# Patient Record
Sex: Female | Born: 1935 | Race: White | Hispanic: No | Marital: Married | Smoking: Former smoker
Health system: Southern US, Community
[De-identification: ages and names within clinical notes are randomized; demographics above are authoritative.]

## PROBLEM LIST (undated history)

## (undated) DIAGNOSIS — M810 Age-related osteoporosis without current pathological fracture: Secondary | ICD-10-CM

## (undated) DIAGNOSIS — K279 Peptic ulcer, site unspecified, unspecified as acute or chronic, without hemorrhage or perforation: Secondary | ICD-10-CM

## (undated) DIAGNOSIS — R269 Unspecified abnormalities of gait and mobility: Secondary | ICD-10-CM

## (undated) DIAGNOSIS — M5481 Occipital neuralgia: Secondary | ICD-10-CM

## (undated) DIAGNOSIS — M1990 Unspecified osteoarthritis, unspecified site: Secondary | ICD-10-CM

## (undated) DIAGNOSIS — S42001A Fracture of unspecified part of right clavicle, initial encounter for closed fracture: Secondary | ICD-10-CM

## (undated) DIAGNOSIS — K219 Gastro-esophageal reflux disease without esophagitis: Secondary | ICD-10-CM

## (undated) DIAGNOSIS — F0781 Postconcussional syndrome: Secondary | ICD-10-CM

## (undated) DIAGNOSIS — G5600 Carpal tunnel syndrome, unspecified upper limb: Secondary | ICD-10-CM

## (undated) DIAGNOSIS — R413 Other amnesia: Secondary | ICD-10-CM

## (undated) DIAGNOSIS — G500 Trigeminal neuralgia: Secondary | ICD-10-CM

## (undated) DIAGNOSIS — E039 Hypothyroidism, unspecified: Secondary | ICD-10-CM

## (undated) DIAGNOSIS — S060X0A Concussion without loss of consciousness, initial encounter: Secondary | ICD-10-CM

## (undated) DIAGNOSIS — F0390 Unspecified dementia without behavioral disturbance: Secondary | ICD-10-CM

## (undated) DIAGNOSIS — I82409 Acute embolism and thrombosis of unspecified deep veins of unspecified lower extremity: Secondary | ICD-10-CM

## (undated) DIAGNOSIS — K649 Unspecified hemorrhoids: Secondary | ICD-10-CM

## (undated) DIAGNOSIS — I872 Venous insufficiency (chronic) (peripheral): Secondary | ICD-10-CM

## (undated) DIAGNOSIS — S329XXA Fracture of unspecified parts of lumbosacral spine and pelvis, initial encounter for closed fracture: Secondary | ICD-10-CM

## (undated) HISTORY — PX: VAGINAL HYSTERECTOMY: SUR661

## (undated) HISTORY — DX: Unspecified hemorrhoids: K64.9

## (undated) HISTORY — DX: Postconcussional syndrome: F07.81

## (undated) HISTORY — DX: Peptic ulcer, site unspecified, unspecified as acute or chronic, without hemorrhage or perforation: K27.9

## (undated) HISTORY — DX: Fracture of unspecified parts of lumbosacral spine and pelvis, initial encounter for closed fracture: S32.9XXA

## (undated) HISTORY — DX: Gastro-esophageal reflux disease without esophagitis: K21.9

## (undated) HISTORY — DX: Occipital neuralgia: M54.81

## (undated) HISTORY — DX: Venous insufficiency (chronic) (peripheral): I87.2

## (undated) HISTORY — DX: Unspecified dementia without behavioral disturbance: F03.90

## (undated) HISTORY — DX: Acute embolism and thrombosis of unspecified deep veins of unspecified lower extremity: I82.409

## (undated) HISTORY — DX: Unspecified osteoarthritis, unspecified site: M19.90

## (undated) HISTORY — DX: Carpal tunnel syndrome, unspecified upper limb: G56.00

## (undated) HISTORY — PX: TONSILLECTOMY: SUR1361

## (undated) HISTORY — PX: OTHER SURGICAL HISTORY: SHX169

## (undated) HISTORY — DX: Other amnesia: R41.3

## (undated) HISTORY — DX: Age-related osteoporosis without current pathological fracture: M81.0

## (undated) HISTORY — DX: Trigeminal neuralgia: G50.0

## (undated) HISTORY — PX: NASAL SINUS SURGERY: SHX719

## (undated) HISTORY — DX: Concussion without loss of consciousness, initial encounter: S06.0X0A

## (undated) HISTORY — DX: Fracture of unspecified part of right clavicle, initial encounter for closed fracture: S42.001A

## (undated) HISTORY — DX: Hypothyroidism, unspecified: E03.9

---
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
## 2004-07-18 ENCOUNTER — Ambulatory Visit: Payer: Self-pay | Admitting: Unknown Physician Specialty

## 2005-05-03 ENCOUNTER — Ambulatory Visit: Payer: Self-pay | Admitting: Internal Medicine

## 2005-08-29 ENCOUNTER — Ambulatory Visit: Payer: Self-pay

## 2005-09-22 ENCOUNTER — Ambulatory Visit: Payer: Self-pay | Admitting: General Practice

## 2005-09-22 ENCOUNTER — Other Ambulatory Visit: Payer: Self-pay

## 2005-10-19 ENCOUNTER — Ambulatory Visit: Payer: Self-pay | Admitting: General Practice

## 2006-05-07 ENCOUNTER — Ambulatory Visit: Payer: Self-pay | Admitting: Internal Medicine

## 2007-01-16 ENCOUNTER — Encounter: Payer: Self-pay | Admitting: Rheumatology

## 2007-02-07 ENCOUNTER — Encounter: Payer: Self-pay | Admitting: Rheumatology

## 2007-05-13 ENCOUNTER — Ambulatory Visit: Payer: Self-pay | Admitting: Internal Medicine

## 2008-05-15 ENCOUNTER — Ambulatory Visit: Payer: Self-pay | Admitting: Internal Medicine

## 2009-02-23 ENCOUNTER — Ambulatory Visit: Payer: Self-pay | Admitting: Orthopedic Surgery

## 2009-02-23 ENCOUNTER — Ambulatory Visit: Payer: Self-pay | Admitting: Cardiology

## 2009-03-03 ENCOUNTER — Ambulatory Visit: Payer: Self-pay | Admitting: Orthopedic Surgery

## 2009-05-18 ENCOUNTER — Ambulatory Visit: Payer: Self-pay | Admitting: Internal Medicine

## 2009-12-17 ENCOUNTER — Ambulatory Visit: Payer: Self-pay | Admitting: Unknown Physician Specialty

## 2010-04-26 ENCOUNTER — Inpatient Hospital Stay: Payer: Self-pay | Admitting: Internal Medicine

## 2010-04-28 ENCOUNTER — Encounter: Payer: Self-pay | Admitting: Internal Medicine

## 2010-05-09 ENCOUNTER — Encounter: Payer: Self-pay | Admitting: Internal Medicine

## 2010-08-11 ENCOUNTER — Ambulatory Visit: Payer: Self-pay | Admitting: Internal Medicine

## 2011-08-14 ENCOUNTER — Ambulatory Visit: Payer: Self-pay | Admitting: Internal Medicine

## 2012-08-19 ENCOUNTER — Ambulatory Visit: Payer: Self-pay | Admitting: Internal Medicine

## 2012-09-03 ENCOUNTER — Inpatient Hospital Stay: Payer: Self-pay | Admitting: Orthopedic Surgery

## 2012-09-03 LAB — CBC
MCH: 31.9 pg (ref 26.0–34.0)
MCV: 95 fL (ref 80–100)
Platelet: 205 10*3/uL (ref 150–440)
RBC: 3.04 10*6/uL — ABNORMAL LOW (ref 3.80–5.20)
RDW: 13.1 % (ref 11.5–14.5)
WBC: 4.9 10*3/uL (ref 3.6–11.0)

## 2012-09-03 LAB — COMPREHENSIVE METABOLIC PANEL
Albumin: 3.5 g/dL (ref 3.4–5.0)
Alkaline Phosphatase: 65 U/L (ref 50–136)
Bilirubin,Total: 0.2 mg/dL (ref 0.2–1.0)
Calcium, Total: 7.8 mg/dL — ABNORMAL LOW (ref 8.5–10.1)
Co2: 23 mmol/L (ref 21–32)
Creatinine: 0.9 mg/dL (ref 0.60–1.30)
EGFR (African American): >60
EGFR (Non-African Amer.): >60
Glucose: 97 mg/dL (ref 65–99)
SGPT (ALT): 21 U/L (ref 12–78)

## 2012-09-03 LAB — URINALYSIS, COMPLETE
Bilirubin,UR: NEGATIVE
Blood: NEGATIVE
Glucose,UR: NEGATIVE mg/dL (ref 0–75)
Leukocyte Esterase: NEGATIVE
RBC,UR: 2 /HPF (ref 0–5)
Squamous Epithelial: <1
WBC UR: <1 /HPF (ref 0–5)

## 2012-09-03 LAB — PROTIME-INR: Prothrombin Time: 13 secs (ref 11.5–14.7)

## 2012-09-04 LAB — CBC WITH DIFFERENTIAL/PLATELET
Basophil #: 0 10*3/uL (ref 0.0–0.1)
Basophil %: 0.6 %
Basophil %: 0.5 %
Eosinophil #: 0 10*3/uL (ref 0.0–0.7)
Eosinophil #: 0.1 10*3/uL (ref 0.0–0.7)
Eosinophil %: 0.8 %
HCT: 26.2 % — ABNORMAL LOW (ref 35.0–47.0)
HCT: 26.3 % — ABNORMAL LOW (ref 35.0–47.0)
HGB: 8.8 g/dL — ABNORMAL LOW (ref 12.0–16.0)
Lymphocyte #: 1.2 10*3/uL (ref 1.0–3.6)
Lymphocyte %: 18.8 %
Lymphocyte %: 14.3 %
MCH: 31.8 pg (ref 26.0–34.0)
MCHC: 34 g/dL (ref 32.0–36.0)
MCHC: 33.5 g/dL (ref 32.0–36.0)
MCV: 94 fL (ref 80–100)
MCV: 95 fL (ref 80–100)
Monocyte #: 0.9 x10 3/mm (ref 0.2–0.9)
Monocyte %: 9.7 %
Neutrophil #: 5.2 10*3/uL (ref 1.4–6.5)
Neutrophil #: 6 10*3/uL (ref 1.4–6.5)
Neutrophil %: 70.4 %
Neutrophil %: 73.8 %
Platelet: 185 10*3/uL (ref 150–440)
RBC: 2.8 10*6/uL — ABNORMAL LOW (ref 3.80–5.20)
RBC: 2.77 10*6/uL — ABNORMAL LOW (ref 3.80–5.20)
RDW: 13.1 % (ref 11.5–14.5)
RDW: 13.3 % (ref 11.5–14.5)

## 2012-09-04 LAB — BASIC METABOLIC PANEL
Calcium, Total: 7.7 mg/dL — ABNORMAL LOW (ref 8.5–10.1)
Co2: 27 mmol/L (ref 21–32)
EGFR (African American): >60
EGFR (Non-African Amer.): >60
Glucose: 91 mg/dL (ref 65–99)
Osmolality: 276 (ref 275–301)
Potassium: 4 mmol/L (ref 3.5–5.1)

## 2012-09-05 LAB — CBC WITH DIFFERENTIAL/PLATELET
Basophil %: 0.5 %
HCT: 25.6 % — ABNORMAL LOW (ref 35.0–47.0)
HGB: 8.8 g/dL — ABNORMAL LOW (ref 12.0–16.0)
Lymphocyte #: 0.8 10*3/uL — ABNORMAL LOW (ref 1.0–3.6)
Lymphocyte %: 10.5 %
MCHC: 34.2 g/dL (ref 32.0–36.0)
MCV: 93 fL (ref 80–100)
Monocyte %: 11.4 %
Neutrophil #: 6 10*3/uL (ref 1.4–6.5)
Neutrophil %: 77.4 %
WBC: 7.7 10*3/uL (ref 3.6–11.0)

## 2012-09-05 LAB — BASIC METABOLIC PANEL
Anion Gap: 6 — ABNORMAL LOW (ref 7–16)
BUN: 10 mg/dL (ref 7–18)
Calcium, Total: 7.4 mg/dL — ABNORMAL LOW (ref 8.5–10.1)
Chloride: 103 mmol/L (ref 98–107)
Co2: 27 mmol/L (ref 21–32)
Creatinine: 0.45 mg/dL — ABNORMAL LOW (ref 0.60–1.30)
EGFR (African American): >60
Sodium: 136 mmol/L (ref 136–145)

## 2012-09-06 LAB — CBC WITH DIFFERENTIAL/PLATELET
Basophil #: 0 10*3/uL (ref 0.0–0.1)
Basophil %: 0.4 %
Eosinophil #: 0.1 10*3/uL (ref 0.0–0.7)
HCT: 27.2 % — ABNORMAL LOW (ref 35.0–47.0)
HGB: 9.5 g/dL — ABNORMAL LOW (ref 12.0–16.0)
Lymphocyte %: 22.2 %
MCH: 32.6 pg (ref 26.0–34.0)
MCHC: 34.8 g/dL (ref 32.0–36.0)
MCV: 94 fL (ref 80–100)
Monocyte #: 1.6 x10 3/mm — ABNORMAL HIGH (ref 0.2–0.9)
Monocyte %: 17.3 %
Neutrophil #: 5.3 10*3/uL (ref 1.4–6.5)
Platelet: 180 10*3/uL (ref 150–440)
RDW: 13.1 % (ref 11.5–14.5)
WBC: 9.1 10*3/uL (ref 3.6–11.0)

## 2012-09-07 LAB — CBC WITH DIFFERENTIAL/PLATELET
Basophil %: 0.6 %
Eosinophil %: 2.3 %
HCT: 26.3 % — ABNORMAL LOW (ref 35.0–47.0)
HGB: 8.9 g/dL — ABNORMAL LOW (ref 12.0–16.0)
Lymphocyte %: 21.7 %
MCH: 31.7 pg (ref 26.0–34.0)
MCV: 94 fL (ref 80–100)
Monocyte #: 1.3 x10 3/mm — ABNORMAL HIGH (ref 0.2–0.9)
Neutrophil #: 5.1 10*3/uL (ref 1.4–6.5)
Platelet: 192 10*3/uL (ref 150–440)
RBC: 2.8 10*6/uL — ABNORMAL LOW (ref 3.80–5.20)
RDW: 13.3 % (ref 11.5–14.5)

## 2012-09-09 LAB — CREATININE, SERUM
EGFR (African American): >60
EGFR (Non-African Amer.): >60

## 2013-03-27 ENCOUNTER — Other Ambulatory Visit: Payer: Self-pay

## 2013-03-27 MED ORDER — GABAPENTIN 100 MG PO CAPS: 100.0000 mg | ORAL_CAPSULE | Freq: Two times a day (BID) | ORAL | Status: DC

## 2013-03-27 MED ORDER — CARBAMAZEPINE ER 200 MG PO TB12: 200.0000 mg | ORAL_TABLET | Freq: Two times a day (BID) | ORAL | Status: DC

## 2013-06-24 ENCOUNTER — Encounter: Payer: Self-pay | Admitting: Neurology

## 2013-06-26 ENCOUNTER — Ambulatory Visit: Payer: Self-pay | Admitting: Neurology

## 2013-07-11 ENCOUNTER — Ambulatory Visit (INDEPENDENT_AMBULATORY_CARE_PROVIDER_SITE_OTHER): Payer: Medicare Other | Admitting: Neurology

## 2013-07-11 ENCOUNTER — Encounter: Payer: Self-pay | Admitting: Neurology

## 2013-07-11 VITALS — BP 132/71 | HR 74 | Wt 130.0 lb

## 2013-07-11 DIAGNOSIS — Z5181 Encounter for therapeutic drug level monitoring: Secondary | ICD-10-CM

## 2013-07-11 DIAGNOSIS — G500 Trigeminal neuralgia: Secondary | ICD-10-CM

## 2013-07-11 MED ORDER — MELOXICAM 7.5 MG PO TABS: 7.5000 mg | ORAL_TABLET | Freq: Every day | ORAL | Status: DC

## 2013-07-11 NOTE — Progress Notes (Signed)
Reason for visit: Trigeminal neuralgia  Suzanne Russo is an 79 y.o. female  History of present illness:  Suzanne Russo is a 79 year old right-handed white female with a history of a right trigeminal neuralgia and a left-sided occipital neuralgia. The patient has been on carbamazepine and low-dose gabapentin. The patient has had good pain control with these medications. The patient reports no significant issues since last seen in March of 2014. The patient has fallen in June of 2014, and fractured her right clavicle. The patient was out in the yard, and she did not have her cane with her. The patient has recovered from this fall. The patient indicates that she is having a lot of pain in her right thumb associated with arthritis. The patient has had prior surgery on her left thumb. The patient otherwise is doing well, she returns for an evaluation.  Past Medical History  Diagnosis Date  . Trigeminal neuralgia   . Occipital neuralgia   . Carpal tunnel syndrome   . Venous insufficiency   . DVT (deep venous thrombosis)   . Hypothyroidism   . Osteoporosis   . Hemorrhoids   . Degenerative arthritis   . GERD (gastroesophageal reflux disease)   . Peptic ulcer disease   . Pelvic fracture   . Right clavicle fracture     Past Surgical History  Procedure Laterality Date  . Left thumb surgery    . Left toe surgery    . Bilateral knee surgery    . Bilateral cataract surgery    . Nasal sinus surgery    . Tonsillectomy    . Vaginal hysterectomy    . Left hip fracture      Family History  Problem Relation Age of Onset  . Heart disease Mother   . COPD Father   . Congestive Heart Failure Brother     Social history:  reports that she has quit smoking. She has never used smokeless tobacco. She reports that  drinks alcohol. She reports that she does not use illicit drugs.    Allergies  Allergen Reactions  . Tetracyclines & Related Rash    Medications:  Current Outpatient Prescriptions on  File Prior to Visit  Medication Sig Dispense Refill  . carbamazepine (TEGRETOL XR) 200 MG 12 hr tablet Take 1 tablet (200 mg total) by mouth 2 (two) times daily.  180 tablet  1  . gabapentin (NEURONTIN) 100 MG capsule Take 1 capsule (100 mg total) by mouth 2 (two) times daily.  180 capsule  1  . levothyroxine (SYNTHROID, LEVOTHROID) 100 MCG tablet Take 100 mcg by mouth daily before breakfast.      . mometasone (NASONEX) 50 MCG/ACT nasal spray Place 2 sprays into the nose daily.      . vitamin E 400 UNIT capsule Take 400 Units by mouth daily.      . zoledronic acid (RECLAST) 5 MG/100ML SOLN injection Inject 5 mg into the vein once.       No current facility-administered medications on file prior to visit.    ROS:  Out of a complete 14 system review of symptoms, the patient complains only of the following symptoms, and all other reviewed systems are negative.  Swelling in the legs Diarrhea Joint pain, joint swelling, achy muscles Allergies  Blood pressure 132/71, pulse 74, weight 130 lb (58.968 kg).  Physical Exam  General: The patient is alert and cooperative at the time of the examination.  Skin: 1+ edema below the knees is noted bilaterally.  Neurologic Exam  Cranial nerves: Facial symmetry is present. Speech is normal, no aphasia or dysarthria is noted. Extraocular movements are full. Visual fields are full.  Motor: The patient has good strength in all 4 extremities.  Coordination: The patient has good finger-nose-finger and heel-to-shin bilaterally.  Gait and station: The patient has a slightly unsteady gait, using a cane for ambulation. Tandem gait was not attempted. Romberg is negative. No drift is seen.  Reflexes: Deep tendon reflexes are symmetric.   Assessment/Plan:  1. Right trigeminal neuralgia  2. Left occipital neuralgia  3. Gait disorder  The patient is doing fairly well on the carbamazepine. The patient will undergo blood work today for this. The  patient will followup through this office in one year or as needed.  Marlan Palau MD 07/12/2013 8:06 AM  [facility] [address] [place], [place] [address]  Phone [phone] Fax [phone]

## 2013-07-12 ENCOUNTER — Telehealth: Payer: Self-pay | Admitting: Neurology

## 2013-07-12 LAB — CBC WITH DIFFERENTIAL
Basophils Absolute: 0 10*3/uL (ref 0.0–0.2)
HCT: 32.2 % — ABNORMAL LOW (ref 34.0–46.6)
Immature Granulocytes: 0 %
Lymphocytes Absolute: 1.4 10*3/uL (ref 0.7–3.1)
MCH: 30.8 pg (ref 26.6–33.0)
MCV: 93 fL (ref 79–97)
Monocytes: 16 %
Neutrophils Absolute: 2.3 10*3/uL (ref 1.4–7.0)
Neutrophils Relative %: 49 %
Platelets: 270 10*3/uL (ref 150–379)
RDW: 13.6 % (ref 12.3–15.4)
WBC: 4.7 10*3/uL (ref 3.4–10.8)

## 2013-07-12 LAB — COMPREHENSIVE METABOLIC PANEL
Albumin: 4.3 g/dL (ref 3.5–4.8)
Alkaline Phosphatase: 86 IU/L (ref 39–117)
BUN/Creatinine Ratio: 22 (ref 11–26)
BUN: 15 mg/dL (ref 8–27)
Creatinine, Ser: 0.69 mg/dL (ref 0.57–1.00)
GFR calc Af Amer: 96 mL/min/{1.73_m2} (ref >59)
Globulin, Total: 2.4 g/dL (ref 1.5–4.5)
Glucose: 75 mg/dL (ref 65–99)
Total Bilirubin: 0.3 mg/dL (ref 0.0–1.2)
Total Protein: 6.7 g/dL (ref 6.0–8.5)

## 2013-07-12 NOTE — Telephone Encounter (Signed)
I called the patient. The blood work show a therapeutic carbamazepine level, and a mild anemia, slightly low chloride level. I will get these results to the primary care MD.

## 2013-09-29 ENCOUNTER — Other Ambulatory Visit: Payer: Self-pay

## 2013-09-29 MED ORDER — CARBAMAZEPINE ER 200 MG PO TB12: 200.0000 mg | ORAL_TABLET | Freq: Two times a day (BID) | ORAL | Status: DC

## 2013-09-29 MED ORDER — GABAPENTIN 100 MG PO CAPS: 100.0000 mg | ORAL_CAPSULE | Freq: Two times a day (BID) | ORAL | Status: DC

## 2013-10-30 ENCOUNTER — Ambulatory Visit: Payer: Self-pay | Admitting: Internal Medicine

## 2013-12-25 ENCOUNTER — Observation Stay: Payer: Self-pay | Admitting: Internal Medicine

## 2013-12-26 LAB — CBC WITH DIFFERENTIAL/PLATELET
BASOS PCT: 1.2 %
Basophil #: 0.1 10*3/uL (ref 0.0–0.1)
EOS ABS: 0.1 10*3/uL (ref 0.0–0.7)
Eosinophil %: 1.6 %
HCT: 28.5 % — ABNORMAL LOW (ref 35.0–47.0)
HGB: 9.8 g/dL — ABNORMAL LOW (ref 12.0–16.0)
LYMPHS ABS: 1.4 10*3/uL (ref 1.0–3.6)
LYMPHS PCT: 28.3 %
MCH: 32 pg (ref 26.0–34.0)
MCHC: 34.2 g/dL (ref 32.0–36.0)
MCV: 93 fL (ref 80–100)
MONO ABS: 0.7 x10 3/mm (ref 0.2–0.9)
MONOS PCT: 14.2 %
NEUTROS ABS: 2.7 10*3/uL (ref 1.4–6.5)
NEUTROS PCT: 54.7 %
PLATELETS: 207 10*3/uL (ref 150–440)
RBC: 3.06 10*6/uL — ABNORMAL LOW (ref 3.80–5.20)
RDW: 13.9 % (ref 11.5–14.5)
WBC: 5 10*3/uL (ref 3.6–11.0)

## 2013-12-26 LAB — BASIC METABOLIC PANEL
Anion Gap: 4 — ABNORMAL LOW (ref 7–16)
BUN: 12 mg/dL (ref 7–18)
CREATININE: 0.5 mg/dL — AB (ref 0.60–1.30)
Calcium, Total: 8 mg/dL — ABNORMAL LOW (ref 8.5–10.1)
Chloride: 104 mmol/L (ref 98–107)
Co2: 29 mmol/L (ref 21–32)
GLUCOSE: 82 mg/dL (ref 65–99)
OSMOLALITY: 273 (ref 275–301)
Potassium: 3.5 mmol/L (ref 3.5–5.1)
Sodium: 137 mmol/L (ref 136–145)

## 2013-12-27 LAB — HEMOGLOBIN: HGB: 10.4 g/dL — AB (ref 12.0–16.0)

## 2013-12-27 LAB — URINALYSIS, COMPLETE
BLOOD: NEGATIVE
Bacteria: NONE SEEN
Bilirubin,UR: NEGATIVE
GLUCOSE, UR: NEGATIVE mg/dL (ref 0–75)
Ketone: NEGATIVE
Leukocyte Esterase: NEGATIVE
Nitrite: NEGATIVE
Ph: 7 (ref 4.5–8.0)
Protein: NEGATIVE
RBC,UR: 2 /HPF (ref 0–5)
SPECIFIC GRAVITY: 1.013 (ref 1.003–1.030)
Squamous Epithelial: <1
WBC UR: 1 /HPF (ref 0–5)

## 2013-12-30 ENCOUNTER — Encounter: Payer: Self-pay | Admitting: Internal Medicine

## 2013-12-30 LAB — CREATININE, SERUM
CREATININE: 0.68 mg/dL (ref 0.60–1.30)
EGFR (African American): >60
EGFR (Non-African Amer.): >60

## 2014-01-07 ENCOUNTER — Encounter: Payer: Self-pay | Admitting: Internal Medicine

## 2014-02-18 ENCOUNTER — Other Ambulatory Visit: Payer: Self-pay

## 2014-02-18 MED ORDER — CARBAMAZEPINE ER 200 MG PO TB12: 200.0000 mg | ORAL_TABLET | Freq: Two times a day (BID) | ORAL | Status: DC

## 2014-02-19 ENCOUNTER — Telehealth: Payer: Self-pay | Admitting: Neurology

## 2014-02-19 NOTE — Telephone Encounter (Signed)
We have only prescribed one strength of this drug.  I called back spoke with Lyla Sonarrie.  She was not able to assist me.  [facility] She said they only have one Rx on file, and does not understand why they called us.  She then [facility] who said they have no record of anyone at their facility contacting us regarding this patient.  They said they called the patient to schedule shipment and the patient told them she did not need refills at this time, so they will save the Rx on the patient's file, and when she needs it, they will fill it.  Nothing is needed from us at this time.

## 2014-02-19 NOTE — Telephone Encounter (Signed)
[facility] calling to state that they received 2 different strengths for the same prescription Tegretol XR and they wanted to clear that up with us. Please call and advise.

## 2014-03-06 ENCOUNTER — Telehealth: Payer: Self-pay | Admitting: Neurology

## 2014-03-06 MED ORDER — CARBAMAZEPINE ER 400 MG PO TB12: 400.0000 mg | ORAL_TABLET | Freq: Two times a day (BID) | ORAL | Status: DC

## 2014-03-06 NOTE — Telephone Encounter (Signed)
Patient needs Rx called in for Tegretol XR 400mg  to [facility]--patient says we may have received request already from [facility]--patient does not need Tegretol XR 200mg  at this time--thank you.

## 2014-03-06 NOTE — Telephone Encounter (Signed)
Rx has been sent  

## 2014-03-18 ENCOUNTER — Encounter: Payer: Self-pay | Admitting: Podiatry

## 2014-03-18 ENCOUNTER — Ambulatory Visit (INDEPENDENT_AMBULATORY_CARE_PROVIDER_SITE_OTHER): Payer: Medicare Other

## 2014-03-18 ENCOUNTER — Ambulatory Visit (INDEPENDENT_AMBULATORY_CARE_PROVIDER_SITE_OTHER): Payer: Medicare Other | Admitting: Podiatry

## 2014-03-18 VITALS — BP 127/73 | HR 63 | Resp 16

## 2014-03-18 DIAGNOSIS — M779 Enthesopathy, unspecified: Secondary | ICD-10-CM

## 2014-03-18 DIAGNOSIS — L03039 Cellulitis of unspecified toe: Secondary | ICD-10-CM

## 2014-03-18 NOTE — Progress Notes (Signed)
   Subjective:    Patient ID: Suzanne Russo, female    DOB: 26-Dec-1931, 80 y.o.   MRN: [id number]  HPI Comments: Its my 2nd toe on my left foot. i dropped a empty wine bottle on it Monday pm. It is painful. It hurts to walk. i wear open toe shoes and i put a bandage on it at night.  Foot Pain Associated symptoms include abdominal pain, nausea, vomiting and weakness.      Review of Systems  Constitutional: Positive for unexpected weight change.  HENT: Positive for hearing loss.        Ringing in ears   Respiratory: Positive for shortness of breath.   Cardiovascular: Positive for leg swelling.  Gastrointestinal: Positive for nausea, vomiting, abdominal pain, diarrhea and constipation.       Bloating  Musculoskeletal: Positive for back pain.       Joint pain Difficulty walking  Neurological: Positive for weakness.  All other systems reviewed and are negative.      Objective:   Physical Exam: I have reviewed her past medical history medications allergies surgeries social history and review systems. Pulses are strongly palpable. Neurologic sensorium is intact per since once the monofilament. Deep tendon reflexes are intact bilateral. Muscle strength is 5 over 5 dorsiflexors plantar flexors inverters everters all intrinsic musculature is intact. Orthopedic evaluation demonstrates all joints distal to the ankle a full range of motion without crepitus she has had surgery to her second third metatarsals as well as her second third digits of the left foot the resulted in an elevated second toe. Cutaneous evaluation demonstrates subungual hematoma second digit of the left foot. Radiographic evaluation demonstrates screw to the second toe does not appear to be a communication between the skin and screw. No fracture noted.        Assessment & Plan:  Assessment: Subungual hematoma second toe left foot.  Plan: Total nail avulsion today and removal of all necrotic tissue at this point the screw  head was not exposed. She tolerated procedure well with local anesthetic she will start soaking twice daily beginning tomorrow.

## 2014-03-18 NOTE — Patient Instructions (Signed)

## 2014-03-25 ENCOUNTER — Ambulatory Visit (INDEPENDENT_AMBULATORY_CARE_PROVIDER_SITE_OTHER): Payer: Medicare Other | Admitting: Podiatry

## 2014-03-25 VITALS — BP 113/43 | HR 59 | Resp 16

## 2014-03-25 DIAGNOSIS — L03039 Cellulitis of unspecified toe: Secondary | ICD-10-CM

## 2014-03-25 NOTE — Progress Notes (Signed)
She presents today after one week of soaking her second toe in Betadine and water and applying a Band-Aid. She status post nail avulsion with I&D second digit left foot.  Objective: Vital signs are stable she is alert and oriented x3. Nailbed appears to be healing quite nicely I see no signs of infection there is no erythema edema cellulitis drainage or odor to the second digital nail bed left foot.  Assessment: Well-healing nail avulsion left.  Plan: Discontinue Betadine start with Epsom salts warm water soaks covered in the day and leave open at night continue to soak and to completely resolved notify me with any questions or concerns otherwise will followup with her in 3-4 weeks

## 2014-04-15 ENCOUNTER — Ambulatory Visit: Payer: Medicare Other | Admitting: Podiatry

## 2014-05-20 ENCOUNTER — Other Ambulatory Visit: Payer: Self-pay

## 2014-05-20 MED ORDER — CARBAMAZEPINE ER 200 MG PO TB12: 200.0000 mg | ORAL_TABLET | Freq: Two times a day (BID) | ORAL | Status: DC

## 2014-06-25 ENCOUNTER — Ambulatory Visit: Payer: Self-pay | Admitting: Unknown Physician Specialty

## 2014-06-26 LAB — PATHOLOGY REPORT

## 2014-07-08 ENCOUNTER — Other Ambulatory Visit: Payer: Self-pay

## 2014-07-08 MED ORDER — GABAPENTIN 100 MG PO CAPS: 100.0000 mg | ORAL_CAPSULE | Freq: Two times a day (BID) | ORAL | Status: DC

## 2014-07-09 ENCOUNTER — Telehealth: Payer: Self-pay | Admitting: Neurology

## 2014-07-09 ENCOUNTER — Ambulatory Visit: Payer: Self-pay | Admitting: Specialist

## 2014-07-09 NOTE — Telephone Encounter (Signed)
See phone note

## 2014-07-09 NOTE — Telephone Encounter (Signed)
Patient's daughter Malen GauzeMarleen called and wanting to make Dr. Anne HahnWillis aware before seeing patient on 10/8 concerning issues noticed with patient recently.  Patient has had increased signs of dementia, vivid dreams that she acts on, gets up in middle to night to set table for company coming for dinner.  Space's out doesn't know husband, thinks spouse is father or son.  Has had a lot physical condition changes in last 6 months.  Questioning if Dementia screening will be done at next appointment.  FYI

## 2014-07-09 NOTE — Telephone Encounter (Signed)
Events noted, the patient will be seen on 07/16/2014. We will need to address the memory issues at that time.

## 2014-07-16 ENCOUNTER — Ambulatory Visit (INDEPENDENT_AMBULATORY_CARE_PROVIDER_SITE_OTHER): Payer: Medicare Other | Admitting: Neurology

## 2014-07-16 ENCOUNTER — Encounter: Payer: Self-pay | Admitting: Neurology

## 2014-07-16 VITALS — BP 153/78 | HR 63 | Ht 63.0 in | Wt 118.4 lb

## 2014-07-16 DIAGNOSIS — R413 Other amnesia: Secondary | ICD-10-CM

## 2014-07-16 DIAGNOSIS — E538 Deficiency of other specified B group vitamins: Secondary | ICD-10-CM

## 2014-07-16 DIAGNOSIS — G500 Trigeminal neuralgia: Secondary | ICD-10-CM

## 2014-07-16 HISTORY — DX: Other amnesia: R41.3

## 2014-07-16 MED ORDER — CARBAMAZEPINE ER 200 MG PO TB12: 200.0000 mg | ORAL_TABLET | Freq: Two times a day (BID) | ORAL | Status: DC

## 2014-07-16 MED ORDER — GABAPENTIN 100 MG PO CAPS: 100.0000 mg | ORAL_CAPSULE | Freq: Two times a day (BID) | ORAL | Status: DC

## 2014-07-16 MED ORDER — CARBAMAZEPINE ER 400 MG PO TB12: 400.0000 mg | ORAL_TABLET | Freq: Two times a day (BID) | ORAL | Status: DC

## 2014-07-16 NOTE — Patient Instructions (Signed)
Trigeminal Neuralgia  Trigeminal neuralgia is a nerve disorder that causes sudden attacks of severe facial pain. It is caused by damage to the trigeminal nerve, a major nerve in the face. It is more common in women and in the elderly, although it can also happen in younger patients. Attacks last from a few seconds to several minutes and can occur from a couple of times per year to several times per day. Trigeminal neuralgia can be a very distressing and disabling condition. Surgery may be needed in very severe cases if medical treatment does not give relief.  HOME CARE INSTRUCTIONS    If your caregiver prescribed medication to help prevent attacks, take as directed.   To help prevent attacks:   Chew on the unaffected side of the mouth.   Avoid touching your face.   Avoid blasts of hot or cold air.   Men may wish to grow a beard to avoid having to shave.  SEEK IMMEDIATE MEDICAL CARE IF:   Pain is unbearable and your medicine does not help.   You develop new, unexplained symptoms (problems).   You have problems that may be related to a medication you are taking.  Document Released: 09/22/2000 Document Revised: 12/18/2011 Document Reviewed: 07/23/2009  Exit[employer] Patient Information 2015 [employer]. This information is not intended to replace advice given to you by your health care provider. Make sure you discuss any questions you have with your health care provider.

## 2014-07-16 NOTE — Progress Notes (Signed)
Reason for visit: Trigeminal neuralgia  Suzanne Russo is an 80 y.o. female  History of present illness:  Suzanne Russo is an 80 year old right-handed white female with a history of trigeminal neuralgia on the right and occipital neuralgia on the left. The patient has done fairly well with the use of gabapentin and carbamazepine. The patient has begun having episodes of vivid dreams within the last month. The patient will wake up at night, and she will be confused, and she will continue to act out her dreams. The patient has some recollection of events around this time. During the daytime, she has not had any problems. The patient denies any new medications that were added or subtracted. The patient continues to do well with her neuralgia pain. She indicates that these episodes of vivid dreams may occur 2 or 3 times a week. The patient has recently been evaluated for gastritis. 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 She has had some recent weight loss. She comes to this office for an evaluation. The patient denies any significant issues with memory otherwise.  Past Medical History  Diagnosis Date  . Trigeminal neuralgia   . Occipital neuralgia   . Carpal tunnel syndrome   . Venous insufficiency   . DVT (deep venous thrombosis)   . Hypothyroidism   . Osteoporosis   . Hemorrhoids   . Degenerative arthritis   . GERD (gastroesophageal reflux disease)   . Peptic ulcer disease   . Pelvic fracture   . Right clavicle fracture   . Memory difficulty 07/16/2014    Past Surgical History  Procedure Laterality Date  . Left thumb surgery    . Left toe surgery    . Bilateral knee surgery    . Bilateral cataract surgery    . Nasal sinus surgery    . Tonsillectomy    . Vaginal hysterectomy    . Left hip fracture      Family History  Problem Relation Age of Onset  . Heart disease Mother   . COPD Father   . Congestive Heart Failure Brother     Social history:   reports that she has quit smoking. Her smoking use included Cigarettes. She has a 45 pack-year smoking history. She has never used smokeless tobacco. She reports that she drinks alcohol. She reports that she does not use illicit drugs.    Allergies  Allergen Reactions  . Sulfa Antibiotics Rash  . Tetracycline Rash    Medications:  Current Outpatient Prescriptions on File Prior to Visit  Medication Sig Dispense Refill  . Calcium Carbonate-Vitamin D (CALCIUM + D PO) Take by mouth daily.      . celecoxib (CELEBREX) 100 MG capsule Take 100 mg by mouth daily.      . Cyanocobalamin (B-12 PO) Take by mouth daily.      Marland Kitchen levothyroxine (SYNTHROID, LEVOTHROID) 100 MCG tablet Take 100 mcg by mouth daily before breakfast.      . loratadine (CLARITIN) 10 MG tablet Take 10 mg by mouth daily.      . mometasone (NASONEX) 50 MCG/ACT nasal spray Place 2 sprays into the nose daily.      Marland Kitchen omeprazole (PRILOSEC) 20 MG capsule Take 20 mg by mouth daily.      . vitamin E 400 UNIT capsule Take 400 Units by mouth daily.      . zoledronic acid (RECLAST) 5 MG/100ML SOLN injection Inject 5 mg into the vein once.  No current facility-administered medications on file prior to visit.    ROS:  Out of a complete 14 system review of symptoms, the patient complains only of the following symptoms, and all other reviewed systems are negative.  Cold intolerance Abdominal pain Constipation, diarrhea, nausea, vomiting Joint pain Memory loss, headache  Blood pressure 153/78, pulse 63, height 5\' 3"  (1.6 m), weight 118 lb 6.4 oz (53.706 kg).  Physical Exam  General: The patient is alert and cooperative at the time of the examination.  Skin: 1+ edema of ankles is noted bilaterally.   Neurologic Exam  Mental status: The  Mini-Mental status examination done today shows a total score of 28/30.  Cranial nerves: Facial symmetry is present. Speech is normal, no aphasia or dysarthria is noted. Extraocular  movements are full. Visual fields are full.  Motor: The patient has good strength in all 4 extremities.  Sensory examination: Soft touch sensation is symmetric on the face, arms, and legs.  Coordination: The patient has good finger-nose-finger and heel-to-shin bilaterally.  Gait and station: The patient has a normal gait. Tandem gait is unsteady. Romberg is negative. No drift is seen.  Reflexes: Deep tendon reflexes are symmetric.   Assessment/Plan:  1. Right sided trigeminal neuralgia  2. Left occipital neuralgia  3. Vivid dreams, possible memory disorder  The patient will be followed for her memory. Blood work will be done today. The patient will followup in 4 months. If there does appear to be progression of memory, MRI evaluation the brain will be done.  Marlan Palau. Keith Waynette Towers MD 07/16/2014 7:32 PM  [facility] [address] [place], [place] [id number]  Phone [phone] Fax [phone]

## 2014-07-17 ENCOUNTER — Telehealth: Payer: Self-pay | Admitting: Neurology

## 2014-07-17 LAB — COMPREHENSIVE METABOLIC PANEL
A/G RATIO: 2.3 (ref 1.1–2.5)
ALBUMIN: 4.4 g/dL (ref 3.5–4.7)
ALK PHOS: 55 IU/L (ref 39–117)
ALT: 21 IU/L (ref 0–32)
AST: 20 IU/L (ref 0–40)
BUN / CREAT RATIO: 15 (ref 11–26)
BUN: 10 mg/dL (ref 8–27)
CO2: 27 mmol/L (ref 18–29)
CREATININE: 0.66 mg/dL (ref 0.57–1.00)
Calcium: 8.9 mg/dL (ref 8.7–10.3)
Chloride: 92 mmol/L — ABNORMAL LOW (ref 97–108)
GFR, EST AFRICAN AMERICAN: 96 mL/min/{1.73_m2} (ref >59)
GFR, EST NON AFRICAN AMERICAN: 84 mL/min/{1.73_m2} (ref >59)
GLOBULIN, TOTAL: 1.9 g/dL (ref 1.5–4.5)
Glucose: 86 mg/dL (ref 65–99)
Potassium: 4.1 mmol/L (ref 3.5–5.2)
Sodium: 135 mmol/L (ref 134–144)
Total Bilirubin: 0.3 mg/dL (ref 0.0–1.2)
Total Protein: 6.3 g/dL (ref 6.0–8.5)

## 2014-07-17 LAB — CBC WITH DIFFERENTIAL
Basophils Absolute: 0 10*3/uL (ref 0.0–0.2)
Basos: 1 %
EOS: 2 %
Eosinophils Absolute: 0.1 10*3/uL (ref 0.0–0.4)
HCT: 29.9 % — ABNORMAL LOW (ref 34.0–46.6)
Hemoglobin: 10.4 g/dL — ABNORMAL LOW (ref 11.1–15.9)
Immature Grans (Abs): 0 10*3/uL (ref 0.0–0.1)
Immature Granulocytes: 0 %
LYMPHS ABS: 1.7 10*3/uL (ref 0.7–3.1)
Lymphs: 46 %
MCH: 31.6 pg (ref 26.6–33.0)
MCHC: 34.8 g/dL (ref 31.5–35.7)
MCV: 91 fL (ref 79–97)
MONOS ABS: 0.6 10*3/uL (ref 0.1–0.9)
Monocytes: 14 %
Neutrophils Absolute: 1.4 10*3/uL (ref 1.4–7.0)
Neutrophils Relative %: 37 %
PLATELETS: 277 10*3/uL (ref 150–379)
RBC: 3.29 x10E6/uL — AB (ref 3.77–5.28)
RDW: 13.7 % (ref 12.3–15.4)
WBC: 3.9 10*3/uL (ref 3.4–10.8)

## 2014-07-17 LAB — VITAMIN B12: Vitamin B-12: 1104 pg/mL — ABNORMAL HIGH (ref 211–946)

## 2014-07-17 LAB — RPR: SYPHILIS RPR SCR: NONREACTIVE

## 2014-07-17 LAB — CARBAMAZEPINE LEVEL, TOTAL: Carbamazepine Lvl: 10.2 ug/mL (ref 4.0–12.0)

## 2014-07-17 NOTE — Telephone Encounter (Signed)
I called the patient. The blood work it shows a relatively normal comprehensive metabolic profile exception of a slightly low but stable chloride level. The CBC shows a stable mild anemia when compared blood work done one year ago. Carbamazepine level is therapeutic, vitamin B12 level is okay.

## 2014-07-20 ENCOUNTER — Ambulatory Visit: Payer: Self-pay | Admitting: Unknown Physician Specialty

## 2014-08-26 ENCOUNTER — Encounter: Payer: Self-pay | Admitting: Neurology

## 2014-09-01 ENCOUNTER — Encounter: Payer: Self-pay | Admitting: Neurology

## 2014-11-19 ENCOUNTER — Ambulatory Visit: Payer: Medicare Other | Admitting: Neurology

## 2014-11-19 ENCOUNTER — Telehealth: Payer: Self-pay | Admitting: Neurology

## 2014-11-19 MED ORDER — CARBAMAZEPINE ER 300 MG PO CP12: 600.0000 mg | ORAL_CAPSULE | Freq: Two times a day (BID) | ORAL | Status: DC

## 2014-11-19 NOTE — Telephone Encounter (Signed)
I called patient. The patient is on carbamazepine for neuralgia pain, okay to switch to generic.

## 2014-11-19 NOTE — Telephone Encounter (Signed)
Patient stated Rx's carbamazepine (TEGRETOL XR) 200 MG 12 hr tablet and  carbamazepine (TEGRETOL XR) 400 MG 12 hr tablet are no longer covered on [employer] formulary.   Stated only 100 mg of TEGRETOL brand name on formulary, along with generic 200 mg and 400 mg.  Questioning if Dr. Anne HahnWillis would like her to change to generic brand.  Please call and advise.

## 2014-12-15 ENCOUNTER — Encounter: Payer: Self-pay | Admitting: Neurology

## 2014-12-15 ENCOUNTER — Ambulatory Visit (INDEPENDENT_AMBULATORY_CARE_PROVIDER_SITE_OTHER): Payer: Medicare Other | Admitting: Neurology

## 2014-12-15 VITALS — BP 101/54 | HR 72 | Ht 64.0 in | Wt 119.0 lb

## 2014-12-15 DIAGNOSIS — G500 Trigeminal neuralgia: Secondary | ICD-10-CM

## 2014-12-15 DIAGNOSIS — M5481 Occipital neuralgia: Secondary | ICD-10-CM

## 2014-12-15 DIAGNOSIS — R413 Other amnesia: Secondary | ICD-10-CM | POA: Diagnosis not present

## 2014-12-15 HISTORY — DX: Occipital neuralgia: M54.81

## 2014-12-15 MED ORDER — CARBAMAZEPINE ER 400 MG PO TB12: 400.0000 mg | ORAL_TABLET | Freq: Two times a day (BID) | ORAL | Status: DC

## 2014-12-15 MED ORDER — CARBAMAZEPINE ER 200 MG PO CP12: 200.0000 mg | ORAL_CAPSULE | Freq: Two times a day (BID) | ORAL | Status: DC

## 2014-12-15 NOTE — Patient Instructions (Signed)
Trigeminal Neuralgia  Trigeminal neuralgia is a nerve disorder that causes sudden attacks of severe facial pain. It is caused by damage to the trigeminal nerve, a major nerve in the face. It is more common in women and in the elderly, although it can also happen in younger patients. Attacks last from a few seconds to several minutes and can occur from a couple of times per year to several times per day. Trigeminal neuralgia can be a very distressing and disabling condition. Surgery may be needed in very severe cases if medical treatment does not give relief.  HOME CARE INSTRUCTIONS    If your caregiver prescribed medication to help prevent attacks, take as directed.   To help prevent attacks:   Chew on the unaffected side of the mouth.   Avoid touching your face.   Avoid blasts of hot or cold air.   Men may wish to grow a beard to avoid having to shave.  SEEK IMMEDIATE MEDICAL CARE IF:   Pain is unbearable and your medicine does not help.   You develop new, unexplained symptoms (problems).   You have problems that may be related to a medication you are taking.  Document Released: 09/22/2000 Document Revised: 12/18/2011 Document Reviewed: 07/23/2009  Exit[employer] Patient Information 2015 [employer]. This information is not intended to replace advice given to you by your health care provider. Make sure you discuss any questions you have with your health care provider.

## 2014-12-15 NOTE — Progress Notes (Signed)
Reason for visit: Trigeminal neuralgia  Suzanne Russo is an 81 y.o. female  History of present illness:  Suzanne Russo is an 81 year old right-handed white female with a history of trigeminal neuralgia on the right, occipital neuralgia on the left, and a mild memory disturbance. The patient indicates that she has done quite well with her memory since last seen without much change. The patient has not had any significant issues with the trigeminal neuralgia or with the occipital neuralgia on carbamazepine taking 600 mg twice daily. The patient also is on low-dose gabapentin taking 100 mg the morning and 200 mg in the evening. She is taking this for pain in the right shin area. The patient does find that the medication may make her somewhat drowsy, she will fall sleep if she is inactive. She uses a cane for ambulation, she has not had any recent falls. She has not given up any activities of daily living because of her memory issues at this point. She returns for an evaluation. She does operate a motor vehicle on occasion without difficulty.  Past Medical History  Diagnosis Date  . Trigeminal neuralgia   . Occipital neuralgia   . Carpal tunnel syndrome   . Venous insufficiency   . DVT (deep venous thrombosis)   . Hypothyroidism   . Osteoporosis   . Hemorrhoids   . Degenerative arthritis   . GERD (gastroesophageal reflux disease)   . Peptic ulcer disease   . Pelvic fracture   . Right clavicle fracture   . Memory difficulty 07/16/2014  . Occipital neuralgia of left side 12/15/2014    Past Surgical History  Procedure Laterality Date  . Left thumb surgery    . Left toe surgery    . Bilateral knee surgery    . Bilateral cataract surgery    . Nasal sinus surgery    . Tonsillectomy    . Vaginal hysterectomy    . Left hip fracture      Family History  Problem Relation Age of Onset  . Heart disease Mother   . COPD Father   . Congestive Heart Failure Brother     Social history:   reports that she has quit smoking. Her smoking use included Cigarettes. She has a 45 pack-year smoking history. She has never used smokeless tobacco. She reports that she drinks alcohol. She reports that she does not use illicit drugs.    Allergies  Allergen Reactions  . Sulfa Antibiotics Rash  . Tetracycline Rash    Medications:  Prior to Admission medications   Medication Sig Start Date End Date Taking? Authorizing Provider  Biotin 1000 MCG tablet Take 1,000 mg by mouth daily.   Yes Historical Provider, MD  Calcium Carbonate-Vitamin D (CALCIUM + D PO) Take by mouth daily.   Yes Historical Provider, MD  celecoxib (CELEBREX) 100 MG capsule Take 100 mg by mouth daily.   Yes Historical Provider, MD  Cyanocobalamin (B-12 PO) Take by mouth daily.   Yes Historical Provider, MD  gabapentin (NEURONTIN) 100 MG capsule Take 1 capsule (100 mg total) by mouth 2 (two) times daily. 07/16/14  Yes York Spanielharles K Joene Gelder, MD  hydrocortisone (ANUSOL-HC) 25 MG suppository Place 25 mg rectally as needed.   Yes Historical Provider, MD  levothyroxine (SYNTHROID, LEVOTHROID) 100 MCG tablet Take 100 mcg by mouth daily before breakfast.   Yes Historical Provider, MD  loratadine (CLARITIN) 10 MG tablet Take 10 mg by mouth daily.   Yes Historical Provider, MD  Magnesium-Potassium  250-100 MG TABS Take 1 tablet by mouth daily.   Yes Historical Provider, MD  meclizine (ANTIVERT) 25 MG tablet Take 25 mg by mouth every 6 (six) hours as needed.   Yes Historical Provider, MD  mometasone (NASONEX) 50 MCG/ACT nasal spray Place 2 sprays into the nose daily.   Yes Historical Provider, MD  Omega-3 Fatty Acids (FISH OIL) 1000 MG CAPS Take 1,000 mg by mouth 2 (two) times daily.   Yes Historical Provider, MD  omeprazole (PRILOSEC) 20 MG capsule Take 20 mg by mouth daily.   Yes Historical Provider, MD  sucralfate (CARAFATE) 1 GM/10ML suspension Take 1 g by mouth 4 (four) times daily as needed. 06/17/14  Yes Historical Provider, MD    TEGRETOL-XR 200 MG 12 hr tablet Take 200 mg by mouth daily. 10/10/14  Yes Historical Provider, MD  TEGRETOL-XR 400 MG 12 hr tablet Take 400 mg by mouth daily. 10/10/14  Yes Historical Provider, MD  vitamin E 400 UNIT capsule Take 400 Units by mouth daily.   Yes Historical Provider, MD  zoledronic acid (RECLAST) 5 MG/100ML SOLN injection Inject 5 mg into the vein once.   Yes Historical Provider, MD  hyoscyamine (LEVSIN, ANASPAZ) 0.125 MG tablet Take 0.125 mg by mouth as needed. 07/16/14 07/26/14  Historical Provider, MD    ROS:  Out of a complete 14 system review of symptoms, the patient complains only of the following symptoms, and all other reviewed systems are negative.  Hearing loss, ringing in the ears, runny nose Abdominal pain, constipation, diarrhea, nausea Leg swelling Dizziness, headache, weakness Itching  Blood pressure 101/54, pulse 72, height  (1.626 m), weight 119 lb (53.978 kg).  Physical Exam  General: The patient is alert and cooperative at the time of the examination.  Skin: No significant peripheral edema is noted.   Neurologic Exam  Mental status: The patient is oriented x 3.  Cranial nerves: Facial symmetry is present. Speech is normal, no aphasia or dysarthria is noted. Extraocular movements are full. Visual fields are full.  Motor: The patient has good strength in all 4 extremities.  Sensory examination: Soft touch sensation on the face, arms, and legs is symmetric.  Coordination: The patient has good finger-nose-finger and heel-to-shin bilaterally.  Gait and station: The patient has a slightly wide-based gait. There is a leg length discrepancy, the left leg is shorter. Tandem gait was not attempted. Romberg is negative. No drift is seen.  Reflexes: Deep tendon reflexes are symmetric.   Assessment/Plan:  1. Mild memory disturbance  2. Right trigeminal neuralgia  3. Left occipital neuralgia  The patient is relatively stable with her neuralgia  pain. The memory issues will need to followed over time. The patient has not been able to afford her brand-name Tegretol medication. The patient will be switched to generic carbamazepine extended-release tablet taking 600 mg twice daily. She will follow-up in about 6 months. We may check blood levels of carbamazepine at that time, if the patient is symptomatically following the medication switch, blood work will need to be done earlier. She will continue her gabapentin in low dose.  Marlan Palau MD 12/15/2014 7:51 PM  [facility] [address] [place], [place] [address]  Phone [phone] Fax [phone]

## 2014-12-16 ENCOUNTER — Telehealth: Payer: Self-pay | Admitting: Neurology

## 2014-12-16 MED ORDER — CARBAMAZEPINE ER 300 MG PO CP12: 600.0000 mg | ORAL_CAPSULE | Freq: Two times a day (BID) | ORAL | Status: DC

## 2014-12-16 NOTE — Telephone Encounter (Signed)
Pharmacy indicated the patient would like to be changed to Carbatrol 300mg  taking two twice daily instead of getting Tegretol and Carbatrol.  Okay to change?  Please advise.  Thank you.

## 2014-12-16 NOTE — Telephone Encounter (Signed)
I have no problem with switching the patient to Carbatrol taking 600 mg twice daily. I will call in this prescription.

## 2014-12-16 NOTE — Telephone Encounter (Signed)
Robbin with [facility] called back and stated patient would prefer Carbatrol 300 mg instead of Equetro 300 mg.  Please call and advise.

## 2014-12-16 NOTE — Telephone Encounter (Signed)
Suzanne Russo with [facility] is calling because he received 2 prescriptions for Rx Tegretol XR: one for 400 mg and the other 200 mg. Patient would prefer to have one Rx  Equetro 300mg . Please advise pharmacist. Thanks!

## 2015-01-26 NOTE — Op Note (Signed)
PATIENT NAME:  Suzanne Russo, Suzanne Russo MR#:  [id number] DATE OF BIRTH:  Mar 22, 1932  DATE OF PROCEDURE:  09/04/2012  PREOPERATIVE DIAGNOSIS: Left intertrochanteric hip fracture.   POSTOPERATIVE DIAGNOSIS: Left intertrochanteric hip fracture.   PROCEDURE: Open reduction and internal fixation of left intertrochanteric hip fracture.   ANESTHESIA: Spinal.   COMPLICATIONS: None.   ESTIMATED BLOOD LOSS: 50 mL.   SURGEON: Kathreen DevoidKevin L. Aradhana Gin, MD  IMPLANTS:  Synthes 2 holed DHS plate.  INDICATIONS FOR PROCEDURE: Patient is a 81 year old active female who sustained a mechanical fall at home. She was diagnosed with an intertrochanteric hip fracture on the left side by x-ray in the Emergency Department. I have discussed the diagnosis and have recommended open reduction internal fixation for this patient. I reviewed the risks and benefits of surgery with the patient, her husband and her daughter who are with her preoperatively. They understand the risks include infection, bleeding requiring blood transfusion, nerve or blood vessel injury, change in leg length and change in lower extremity rotation as well as painful hardware, persistent left hip pain, malunion, nonunion, and the need for further surgery including hardware removal and/or total hip arthroplasty. Medical complications include deep vein thrombosis and pulmonary embolism, myocardial infarction, stroke, pneumonia, respiratory failure and death.   PROCEDURE NOTE: The patient's family signed her consent form. Her left hip was marked according to the hospital's right site protocol. She was brought to the Operating Room where she underwent a spinal anesthesia and was positioned supine on the fracture table. Her left leg was placed in leg holder with traction and internal rotation and the right leg was placed in a hemi- lithotomy position. All bony prominences were adequately padded.   A timeout was performed to verify the patient's name, date of birth, medical  record number, correct site of surgery and correct procedure to be performed. It was also used to verify patient had received antibiotics and that all appropriate instruments, implants, and radiographic studies were available in the room. Once all in attendance were in agreement the case began.   C-arm images were taken in the AP and lateral plane to insure adequate closed reduction. Once the fracture was adequately reduced in both AP and lateral projections the patient was prepped and draped in sterile fashion. A lateral incision was made over the lateral hip at the level of the fracture site extending distally. The soft tissues were carefully dissected using electrocautery until the fascia lata was identified. This was then sharply, incised with a deep #10 blade. The vastus lateralis muscle was then elevated from the intermuscular septum until the lateral femur was identified. A 130 degree drill guide was then placed along the lateral femur. A drill pin was advanced into the lateral femur across the fracture site into the femoral head. The position of the guidepin within the femur was confirmed on AP and lateral projections. A tip apex distance of less than 25 mm was achieved. The depth of the guidepin was measured at 95 mm. A long barreled drill was then used to over drill the guidewire to a depth of 95 mm. A Synthes DHS 95 mm lag screw with two hole sideplate was then constructed on the back table. The lag screw was advanced by hand into position in the subchondral bone of the femoral head. The two hole plate was then carefully malleted into position along the collateral femur. The insertion device was then removed. C-arm images in the AP and lateral plane were used to confirm the final  position of the hardware. Once adequate position was achieved two bicortical screws were drilled through the holes in the sideplate. Once in position, final images were taken. The fracture was well reduced and no compression  was necessary. The wound was copiously irrigated. 0 Vicryl was used to close the fascia lata and the wound was then copiously irrigated again. 2-0 Vicryl was then used to approximate the dermal layers and the skin was closed with staples. A dry sterile dressing was applied. The patient was then transferred to a hospital bed and brought to the PAC-U in stable condition. I spoke with the patient's family in the postoperative waiting room to let them know the case had gone without complication and that Suzanne Russo was stable in the recovery room.   ____________________________ [name], MD klk:cms D: 09/11/2012 17:06:06 ET Russo: 09/11/2012 17:43:39 ET JOB#: [id number]  cc: [name], MD, <Dictator> [name] MD ELECTRONICALLY SIGNED 09/12/2012 15:34

## 2015-01-26 NOTE — H&P (Signed)
Subjective/Chief Complaint Left hip pain    History of Present Illness 81 y/o female admitted to orthopaedic surgery overnight after a fall at home.  Patient states she was doing laundry in the basement and fell while leaning up against a bar while collecting things she needed for Thanksgiving.  Patient landed directly on the left hip.  She was unable to stand or ambulate after the fall.  Patient also had pain in the right shoulder.   Past Med/Surgical Hx:  morton's neuroma:   venous clodure right leg:   right knee meniscus repair:   broken pelvis:   SMR:   Hysterectomy - Total:   Tonsillectomy:   ALLERGIES:  Tetracaine: Rash  Sulfa drugs: GI Distress, Other  Tetracycline: Other  HOME MEDICATIONS: Medication Instructions Status  Synthroid 100 mcg (0.1 mg) oral tablet 1 tab(s) orally once a day Active  meloxicam 7.5 mg oral tablet 1 tab(s) orally once a day Active  omeprazole 20 mg oral delayed release capsule 1 cap(s) orally once a day Active  Tegretol XR 400 mg oral tablet, extended release 1 tab(s) orally 2 times a day Active  Tegretol XR 200 mg oral tablet, extended release 1 tab(s) orally 2 times a day Active  Nasonex 50 mcg/inh nasal spray 2 spray(s) nasal once a day Active  cyclobenzaprine 10 mg oral tablet 1 tab(s) orally 3 times a day Active  gabapentin 100 mg oral capsule 1 cap(s) orally 2 times a day Active  Reclast 5 mg/100 mL intravenous solution 1 inch(es) intravenous yearly Active  meclizine 25 mg oral tablet 1 tab(s) orally 3 times a day, As Needed Active  vitamin E  orally once a day Active  Calcium 600+D 600 mg-200 intl units oral tablet 1 tab(s) orally 2 times a day Active  magnesium and potassium aspartate 1   once a day Active  Loratadine-D 24 Hour oral tablet, extended release 1 tab(s) orally once a day Active  Fish Oil 1200 mg oral capsule 1 cap(s) orally 3 times a day Active  Vitamin B-12 1000 mcg oral tablet 1 tab(s) orally once a day Active   Family  and Social History:   Family History Non-Contributory    Place of Living Home   Review of Systems:   Subjective/Chief Complaint Left hip pain and right shoulder pain    Fever/Chills No    Cough No    SOB/DOE No    Chest Pain No    Medications/Allergies Reviewed Medications/Allergies reviewed   Physical Exam:   GEN no acute distress    HEENT PERRL, hearing intact to voice, moist oral mucosa, Oropharynx clear    NECK supple  No masses  trachea midline    RESP normal resp effort  clear BS  no use of accessory muscles    CARD regular rate  murmur present  No LE edema  no JVD    ABD denies tenderness  soft  normal BS  no Adominal Mass    GU clear yellow urine draining    LYMPH negative neck    EXTR Left hip externally rotated.  In Buck's traction.  Has intact sensation to light touch in all toes.  Foot is well perfused.  Skin over left hip is intact.  No eryrthema or ecchymosis.  Pedal pulses are palpable.    SKIN normal to palpation    NEURO motor/sensory function intact    PSYCH alert   Lab Results: Routine Chem:  27-Nov-13 04:47    Glucose, Serum  91   BUN  21   Creatinine (comp) 0.65   Sodium, Serum 137   Potassium, Serum 4.0   Chloride, Serum 105   CO2, Serum 27   Calcium (Total), Serum  7.7   Anion Gap  5   Osmolality (calc) 276   eGFR (African American) >60   eGFR (Non-African American) >60 (eGFR values <80m/min/1.73 m2 may be an indication of chronic kidney disease (CKD). Calculated eGFR is useful in patients with stable renal function. The eGFR calculation will not be reliable in acutely ill patients when serum creatinine is changing rapidly. It is not useful in  patients on dialysis. The eGFR calculation may not be applicable to patients at the low and high extremes of body sizes, pregnant women, and vegetarians.)  Routine Hem:  27-Nov-13 04:47    WBC (CBC) 7.4   RBC (CBC)  2.80   Hemoglobin (CBC)  8.9   Hematocrit (CBC)  26.2   Platelet  Count (CBC) 185   MCV 94   MCH 31.8   MCHC 34.0   RDW 13.1   Neutrophil % 70.4   Lymphocyte % 18.8   Monocyte % 9.7   Eosinophil % 0.5   Basophil % 0.6   Neutrophil # 5.2   Lymphocyte # 1.4   Monocyte # 0.7   Eosinophil # 0.0   Basophil # 0.0 (Result(s) reported on 04 Sep 2012 at 06:38AM.)     Assessment/Admission Diagnosis Left intertrochanteric hip fracture    Plan I explained the diagnosis to the patient, her husband and her daughter who were with her in the room.  I am recommending ORIF for her intertrochanteric hip fracture.  I used the white board in the patient's room to diagram the injury and proposed surgery.  I explained the procedure in detail to them.  The The risks and benefits of surgical intervention were discussed in detail with the patient and her family and they expressed understanding of the risks and benefits and agreed with plans for surgery.   The risks include, but are not limited to: infection, bleeding requiring transfusion, nerve and blood vessel injury, fracture, leg length discrepancy, change in lower extremity rotation, malunion, nonunion, hardware failure or cut out, painful hardware, need for more surgery including conversion to a total hip arthroplasty, DVT, and PE, MI, stroke, pneumonia, respiratory failure and death.  Surgical site signed as per "right site surgery" protocol.  Patient is NPO.  I have personally reviewed her radiographic studies and labs.  Patient has been cleared by the [facility] hospitalist service for surgery.   Electronic Signatures: KThornton Park(MD)  (Signed [id number])  Authored: CHIEF COMPLAINT and HISTORY, PAST MEDICAL/SURGIAL HISTORY, ALLERGIES, HOME MEDICATIONS, FAMILY AND SOCIAL HISTORY, REVIEW OF SYSTEMS, PHYSICAL EXAM, LABS, ASSESSMENT AND PLAN   Last Updated: 27-Nov-13 17:20 by KThornton Park(MD)

## 2015-01-26 NOTE — Consult Note (Signed)
PATIENT NAME:  Suzanne Russo, Narmeen T MR#:  [id number] DATE OF BIRTH:  04/18/1932  DATE OF CONSULTATION:  09/04/2012  REFERRING PHYSICIAN:  Dr. Martha ClanKrasinski  CONSULTING PHYSICIAN:  Ramonita LabAruna Thelbert Gartin, MD  PRIMARY CARE PHYSICIAN: [facility]ates [facility]John Walker [facility]   REASON FOR CONSULTATION: Preop clearance.   HISTORY OF PRESENT ILLNESS: Patient is a 81 year old Caucasian female with past medical history of trigeminal neuralgia, gastroesophageal reflux disease. hypothyroidism, osteoporosis, peripheral vascular disease and arthritis was brought into the ER after she sustained a fall. Patient is reporting that she was hanging some clothes in her basement and lost her balance and fell on the floor. Patient landed on her left hip and bumped and hit right shoulder. As the patient was having severe pain husband brought her into the ER. X-ray has revealed left hip fracture. Patient was admitted to Dr. Samuel GermanyKrasinski's service and hospitalist team is consulted regarding preop clearance. Patient denies any chest pain or shortness of breath. Denies any history of heart attacks in the past. Never seen by any cardiologist in the past. Denies any history of hypertension either. No history of diabetes mellitus. Patient is reporting that she is pretty healthy except for osteoporosis regarding which she takes Reclast every year. During my examination she is reporting that her pain is tolerable with the current pain management regimen. Patient denies any abdominal pain but complaining of right shoulder pain after she had fall.   PAST MEDICAL HISTORY:  1. Trigeminal neuralgia for which she takes Tegretol. 2. Gastroesophageal reflux disease. 3. Hypothyroidism. 4. Osteoporosis. Patient is on Reclast once a year.  5. Arthritis. 6. Peripheral vascular disease.    PAST SURGICAL HISTORY:  1. Tonsillectomy and adenoidectomy. 2. Hysterectomy. 3. Stomach ulcer surgery. 4. Sinus surgery.  5. Left arm surgery. 6. Knee surgery  bilaterally. 7. Bladder tack x2.  8. Cataracts.   ALLERGIES: Patient is allergic to sulfa, tetracycline.  HOME MEDICATIONS:  1. Vitamin E once a day.  2. Vitamin B12 1 tablet once a day. 3. Tegretol-XR 400 mg twice a day. 4. Tegretol-XR 200 mg 1 tablet twice a day. Probably she is taking 400 and 200 together to make it a total of 600.  5. Synthroid 100 mcg p.o. once daily.  6. Reclast once a year. 7. Omeprazole 20 mg once daily.  8. Meloxicam 7.5 mg once a day.  9. Meclizine 25 mg 3 times a day. 10. Gabapentin 100 mg p.o. b.i.d.  11. Fish oil 1200 mg 1 capsule 3 times a day. 12. Cyclobenzaprine 10 mg 1 tablet 3 times a day. 13. Calcium with vitamin D 1 tablet p.o. 2 times a day.   PSYCHOSOCIAL HISTORY: Lives at home with husband. Drinks one glass of wine per night. Denies any smoking. 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    FAMILY HISTORY: Father died of internal hemorrhage at age 79. Mother died at age 391    REVIEW OF SYSTEMS: CONSTITUTIONAL: Denies any fever, fatigue, weakness. Complaining of hip pain. Denies any weight loss or weight gain. EYES: Denies any blurry vision, inflammation, cataracts. ENT: Denies tinnitus, epistaxis, discharge, snoring, postnasal drip, difficulty in swallowing. RESPIRATORY: Denies cough, wheezing, chronic obstructive pulmonary disease, tuberculosis or pneumonia. CARDIOVASCULAR: Denies any chest pain, orthopnea, varicose veins. Denies any orthopnea either. No palpitations, syncope. GASTROINTESTINAL: Patient is not complaining of any nausea, vomiting, or diarrhea. Denies any abdominal pain, hematemesis, melena, constipation. GENITOURINARY: Denies dysuria, hematuria, renal calculus, frequency or incontinence. ENDOCRINOLOGY: Denies polyuria,  nocturia. Positive hypothyroidism, increased sweating. HEMATOLOGIC/LYMPHATIC: Denies any bruising, bleeding or swollen glands. INTEGUMENTARY: Denies acne, rash or lesions, change in  mole, hair or skin. Neurologically: Patient is awake, alert and oriented x3. Denies any numbness, weakness, dysarthria, epilepsy, tremor, vertigo, CVA, transient ischemic attack, seizures, memory loss.   PHYSICAL EXAMINATION:  VITAL SIGNS: Temperature 97.5, pulse 74, respiratory rate 18, blood pressure 101/46.   GENERAL APPEARANCE: Not in acute distress, well built and well nourished.   HEENT. Normocephalic, atraumatic. Pupils are equally reacting to light and accommodation. Extraocular movements are intact   ENT: No postnasal drip. Moist mucous membranes. Throat with no erythema.   NECK: Supple. No JVD. No lymphadenopathy. No thyromegaly. No thyroid nodules are palpated.   LUNGS: Clear to auscultation. No accessory muscle usage.   HEART: S1, S2 normal. Regular rate and rhythm. 2/6 ejection systolic murmur is present.   GI: Soft. Bowel sounds are positive in all four quadrants. No organomegaly. No splenomegaly. No masses felt. Nontender, nondistended.   LYMPHATIC: No swollen glands  NEUROLOGICAL: Grossly intact. Deep tendon reflexes 2+.   MUSCULOSKELETAL: No clubbing. No edema. No cyanosis.    PSYCH: Patient is oriented to time, place and person.   LABORATORY, DIAGNOSTIC AND RADIOLOGICAL DATA: Glucose 97, creatinine 0.90, BUN 18, sodium 136, potassium 3.6, chloride 103, CO2 23, GFR greater than 60. LFTs are within normal range. WBC 4.9%, hemoglobin 9.7, hematocrit 28.8, platelet count 205,000, PT 13.0, INR 0.9, PTT 27.0. Urinalysis: Yellow in color, clear in appearance, glucose negative, bilirubin negative, ketones trace, specific gravity 1.012, blood negative, protein negative.. 12-lead EKG revealed normal sinus rhythm with first degree AV block, right bundle branch block and septal infarct.   ASSESSMENT AND PLAN: 81 year old Caucasian female presented to the ER after she sustained a fall, will be admitted with the following assessment and plan:  1. Left hip fracture and left hip pain.  Will leave the pain management to the orthopedic doctor attending physician.  2. Neuralgias. Actually patient has trigeminal neuralgia regarding which she is on Tegretol. Will resume Tegretol once patient starts eating.  3. Gastroesophageal reflux disease. Will give GI prophylaxis.  4. Hypothyroidism. Continue Synthroid.  5. Peripheral vascular disease. Patient will be benefited with small dose beta blocker <<  6. Will provide the patient with GI and deep vein thrombosis prophylaxis and medically optimize the patient for noncardiac surgery. Patient is FULL CODE status.   The diagnosis and plan of care was discussed in detail with the patient. She is aware of the plan.   TOTAL TIME SPENT: 60 minutes.   ____________________________ Ramonita Lab, MD ag:cms D: 09/04/2012 01:45:42 ET T: 09/04/2012 08:48:13 ET JOB#: [id number]  cc: Ramonita Lab, MD, <Dictator> John B. Danne Harbor, MD Ramonita Lab MD ELECTRONICALLY SIGNED 09/06/2012 3:07

## 2015-01-26 NOTE — Discharge Summary (Signed)
PATIENT NAME:  Suzanne Russo, [name] MR#:  [id number] DATE OF BIRTH:  [birth year]  DATE OF ADMISSION:  09/03/2012 DATE OF DISCHARGE:  09/09/2012  ADMITTING DIAGNOSIS: Status post open reduction and internal fixation for left intertrochanteric hip fracture.   HISTORY OF PRESENT ILLNESS: Ms. Suzanne Russo is a 81 year old female who sustained a fall at home. She was in the basement doing laundry and fell onto the left side, injuring her left hip. She was unable stand or ambulate after the injury. She was diagnosed with an intertrochanteric hip fracture by x-ray in the [facility] and was admitted to the Orthopedic Surgery Service for further evaluation and management.   PAST MEDICAL/SURGICAL HISTORY:  1. Morton's neuroma.  2. Previous right knee meniscal repair. 3. Previous pelvic fracture. 4. Status post hysterectomy.  5. Status post tonsillectomy.   ALLERGIES: Tetracaine, sulfa drugs and tetracycline.   HOME MEDICATIONS:  1. Synthroid 100 mcg daily. 2. Meloxicam 7.5 mg daily. 3. Omeprazole 20 mg daily. 4. Tegretol-XR 400 mg b.i.d.  5. Tegretol-XR 200 mg, one tablet b.i.d.  6. Nasonex 50 mcg per inhalation nasal spray, two sprays daily.  7. Cyclobenzaprine 10 mg t.i.d.  8. Gabapentin 100 mg b.i.d.  9. Reclast 5 mg/100 mL intravenous solution once yearly. 10. Meclizine 25 mg t.i.d. p.r.n.  11. Vitamin E p.o. daily.  12. Calcium Plus vitamin D, one tablet b.i.d.  13. Magnesium and potassium one tablet daily.  14. Loratadine D 24-hour extender released tablet, one tab p.o. daily.   HOSPITAL COURSE: The patient was admitted to the Orthopedic Surgery Service on 09/04/2012. Later that day she was brought to the Operating Room and underwent an uncomplicated open reduction and internal fixation of her left intertrochanteric hip fracture. Postoperatively, she returned to the Orthopedic floor. She was kept on 24 hours of IV antibiotics. On postoperative day one, she was started on  Lovenox 30 mg b.i.d. for deep vein thrombosis prophylaxis. Physical and Occupational Therapy consults were called. The patient had been cleared for surgery by the [facility]. She was also seen preoperatively by Dr. Yates DecampJohn Walker, her primary care physician. On postoperative day one, the patient was hypotensive and responded well to a single unit of packed red blood cells for acute blood loss anemia as a result of surgery combined with her fracture. The patient continued to improve clinically throughout the remainder of her hospitalization. Her dressing was changed, and her incision was clean, dry, and intact. Her thigh compartments remained soft and compressible. The patient had basically no other acute issues during her hospitalization. She was tolerating a p.o. diet and progressing with therapy. She had mild sundowning while in the hospital but no significant confusion during the day. By postoperative day four, the patient was clinically stable. Her dressing was clean, dry, and intact. She was progressing with therapy and had no complaints. Given her clinical improvement, she was prepared for discharge to rehab.   DISCHARGE INSTRUCTIONS AND FOLLOWUP:  1. The patient will remain on Lovenox 40 mg daily while at rehab.  2. She will continue wearing the TED stockings as well.  3. She will remain partial weight-bearing on the left lower extremity with a walker.  4. She will continue physical and occupational therapy for strengthening and gait training.  5. She should have daily dressing changes until the incision is clean, dry, and intact.  6. Staples are being removed in the Orthopedic office on her first postoperative visit.  7. The patient will remain on  all of her home medications and will continue Lovenox 40 mg subcutaneous daily for deep vein thrombosis prophylaxis, and may continue taking Norco 5/325 mg, 1 to 2 tabs every 4 to 6 hours p.r.n. for pain.   DISCHARGE DIAGNOSES:  1. Status  post open reduction and internal fixation for left intertrochanteric hip fracture.  2. Acute blood loss anemia.  ____________________________ [name], MD klk:cbb D: 09/09/2012 17:44:27 ET T: 09/09/2012 18:16:20 ET JOB#: [id number] cc: Jonny Ruiz B. Danne Harbor, MD [name] MD ELECTRONICALLY SIGNED 09/11/2012 17:38

## 2015-01-30 NOTE — H&P (Signed)
PATIENT NAME:  JANETE, QUILLING MR#:  [id number] DATE OF BIRTH:  [birth year]  DATE OF ADMISSION:  12/25/2013   PRIMARY CARE PHYSICIAN: John B. Danne Harbor, MD  REFERRING PHYSICIAN: Rockne Menghini, MD  CHIEF COMPLAINT: Fall, left leg pain.   HISTORY OF PRESENT ILLNESS: Ms Prosser is a 81 year old female with history of hypothyroidism, trigeminal neuralgia, presented to the Emergency Department after having a fall in the morning. The patient was trying to walk out of the bathroom where there was some construction work was going on. The patient tripped on the wire and fell down to the floor, was unable to stand up. Concerning this, the patient is brought to the Emergency Department. The patient is found to have left iliac ring fracture. The patient denied having any chest pain, palpitations, nausea or vomiting at that time. Denies loss of consciousness.   PAST MEDICAL HISTORY: 1. Hypothyroidism.  2. Trigeminal neuralgia.  3. Gastroesophageal reflux disease.  4. Dumping syndrome.  5. Osteoporosis.  6. Osteoarthritis.  7. Peripheral vascular disease.   PAST SURGICAL HISTORY:  1. Tonsillectomy and adenoidectomy.  2. Hysterectomy.  3. Partial gastrectomy for peptic ulcer disease.  4. Sinus surgery.  5  surgery.  6. Left thumb surgery.  7. Hip fracture surgery.  8. Bladder tack. 9. Cataracts.   ALLERGIES: SULFA, TETRACYCLINE.   HOME MEDICATIONS: 1. Vitamin E 1 capsule once a day.  2. Vitamin  micrograms once a day.  3. Tegretol 400 milligrams 2 times a day.  4. Reclast 5 milligrams IV daily.  5. Omeprazole 20 milligrams once a day.  6. Nasonex once a day.  7. Meclizine 25 milligrams every 6 hours as needed.  8. Magnesium  1 tablet once a day.  9. Loratadine 1 tablet once a day.  10. Levothyroxine 100 micrograms once a day.  11. Neurontin 100 milligrams 2 times a day.  12. Fish oil 1200 milligrams 2 times a day.  13. Celebrex 200 milligrams once a day.  14. Calcarb with vitamin  D 2 times a day.  15. Anusol suppository rectal 2 times a day as needed.   SOCIAL HISTORY: No history of smoking, drinking alcohol or using illicit drugs. Married, lives with her husband, who is an 59 year old who has difficulty walking.   FAMILY HISTORY: Father died at the age of 47 from internal hemorrhage. Mother died at the age of 81 from heart disease. Brother with heart failure and heart disease.   REVIEW OF SYSTEMS: CONSTITUTIONAL: Denies any generalized weakness.  EYES: No change.  EARS, NOSE, THROAT: No change in hearing.  RESPIRATORY: Has got no cough, shortness of breath.  CARDIOVASCULAR: No chest pain, palpations.  GASTROINTESTINAL: No nausea, vomiting, abdominal pain.  GENITOURINARY: No dysuria or hematuria.  ENDOCRINE: No polyuria or polydipsia.  HEMATOLOGIC: No easy bruising or bleeding.  SKIN: No rashes or lesions.  MUSCULOSKELETAL: Has hip pain and left shoulder pain.  NEUROLOGICAL: Has trigeminal neuralgia.  PSYCHIATRIC: No depression or anxiety.   PHYSICAL EXAMINATION: GENERAL: This is a well-developed, well-nourished, age-appropriate female lying down in the bed, not in distress.  VITAL SIGNS: Temperature 97.9, pulse 66, blood pressure 121/59, respiratory rate of 18, oxygen saturation is 97% on room air.  HEENT: Head normocephalic, atraumatic. There is no scleral icterus. Conjunctivae normal. Pupils equal, round and react to light. Extraocular movements are intact. Mucous membranes moist. No pharyngeal erythema.  NECK: Supple. No lymphadenopathy. No JVD. No carotid bruit. No thyromegaly.  CHEST: Has no focal tenderness.  LUNGS: Bilaterally  clear to auscultation.  HEART: S1, S2 regular. No murmurs are heard.  EXTREMITIES: No pedal edema. Pulses 2+.  ABDOMEN: Bowel sounds present. Soft, nontender, nondistended. No hepatosplenomegaly.  SKIN: No rash or lesions.  MUSCULOSKELETAL: Has pain in the left hip area and left shoulder pain.  NEUROLOGIC: The patient is alert,  oriented to place, person and time. Cranial nerves II through XII intact. Motor 5/5 in upper and lower extremities.   LABORATORIES: Are not done during the Emergency Department stay.   ASSESSMENT AND PLAN: Ms [name] is a 81 year old female who had a mechanical fall, comes with pelvic fracture.  1. Pelvic fracture. Iliac ring fracture. Admit the patient to be medical bed, involve physical therapy. Concerning about the patient's poor functional status as well as the patient's husband's poor functional status, the patient would benefit going to a skilled nursing facility. Involve the case management for possible placement.  2. Hypothyroidism. Continue Synthroid.  3. Trigeminal neuralgia.  Continue with the Tegretol.  4. Keep the patient on deep vein thrombosis prophylaxis with Lovenox.   TIME SPENT:  Forty-five minutes.   ____________________________ Susa GriffinsPadmaja Rin Gorton, MD [phone] D: 12/25/2013 21:29:16 ET T: 12/25/2013 22:00:16 ET JOB#: [id number]  cc: Susa GriffinsPadmaja Jarrid Lienhard, MD, <Dictator> John B. Danne HarborWalker III, MD Clerance LavPADMAJA Mekisha Bittel MD ELECTRONICALLY SIGNED 01/22/2014 0:08

## 2015-01-30 NOTE — Consult Note (Signed)
Brief Consult Note: Diagnosis: Left iliac crest fracture.   Patient was seen by consultant.   Recommend to proceed with surgery or procedure.   Recommend further assessment or treatment.   Orders entered.   Comments: 81 year old female tripped and fell on left side yesterday injuring the left pelvis.  Brought to Emergency Room where exam and X-rays show a minimally displaced left iliac wing fracture. Admitted for pain control and rehabilitation facility. history of left hip fracture and right pubic fractures.    Exam:  Alert and cooperative.  circulation/sensation/motor function good and range of motion left hip satisfactory.  Tender over left iliac wing.  No significant bruising as yet.  Leg lengths good.    X-rays: as above  Imp:  Left iliac wing fracture   Rx:  Physical Therapy with weight bearing as tolerated on walker.        Ice to fracture         Short term skilled nursing facility rehabilitation.  Electronic Signatures: Valinda HoarMiller, Manning Luna E (MD)  (Signed 20-Mar-15 15:24)  Authored: Brief Consult Note   Last Updated: 20-Mar-15 15:24 by Valinda HoarMiller, Jakyrah Holladay E (MD)

## 2015-01-30 NOTE — Discharge Summary (Signed)
PATIENT NAME:  Suzanne Russo, Suzanne Russo MR#:  [id number] DATE OF BIRTH:  10-31-1931  DATE OF ADMISSION:  12/26/2013 DATE OF DISCHARGE: 12/29/2013   HISTORY OF PRESENT ILLNESS: Suzanne Russo is a 81 year old white lady, who was walking out of her bathroom and tripped over the vacuum cord falling to the floor. She noticed pain in her left leg and presented to the Emergency Room where she was found to have a left iliac wing fracture. The patient was therefore admitted for pain control.   PAST MEDICAL HISTORY: Notable for hypothyroidism, trigeminal neuralgia, gastroesophageal reflux disease, osteoporosis, osteoarthritis and peripheral vascular disease.   PAST SURGICAL HISTORY: Included a previous Russo and A, a hysterectomy, a partial gastrectomy, a sinus surgery, left thumb surgery, hip fracture surgery, a bladder tacking and cataract surgery.   MEDICATIONS ON ADMISSION: Included Tegretol 400 mg b.i.d., Reclast 5 mg IV, omeprazole 20 mg daily, Nasonex 1 puff each nostril daily, meclizine 25 mg every 6 hours as needed, magnesium 400 mg daily, Claritin 1 tablet daily, Levoxyl 100 mcg daily, Neurontin 100 mg b.i.d., fish oil 1200 mg b.i.d., Celebrex 200 mg once a day, calcium carbonate with vitamin D b.i.d. and Anusol suppository rectal twice daily as needed.   ADMISSION VITAL SIGNS: Showed temperature 97.9, pulse 66, blood pressure 121/59, respiratory rate 18.   PHYSICAL EXAMINATION:  Admission physical examination as described by the admitting physician was notable only for pain with range of motion in the left hip and left shoulder.   Admission CBC showed a hemoglobin of 9.8 with a hematocrit of 28.5, white count was 5000 and platelet count was 207,000. Admission basic metabolic panel showed a calcium of 8.0, but was otherwise unremarkable. Urinalysis was within normal limits. EKG showed normal sinus rhythm with a right bundle branch block. X-ray of the shoulder showed no acute fractures. X-ray of the left hip showed the  previous surgical screw. There was a displaced acute fracture of the left iliac wing.   HOSPITAL COURSE: The patient was admitted to the regular medical floor where she was started on IV morphine for pain relief. She was seen in consultation by orthopedics, who did not recommend surgical repair. The patient was also seen and evaluated by physical therapy.   DISCHARGE DIAGNOSIS: Displaced acute fracture of the left iliac wing.   DISCHARGE MEDICATIONS:  1.  Celebrex 200 mg daily.  2.  Neurontin 100 mg b.i.d.  3.  Synthroid 100 mcg daily.  4.  Calcium carbonate 500 mg plus vitamin D 200 units 1 tablet b.i.d.  5.  Tegretol-XR 600 mg b.i.d.  6.  Percocet 5/325, 1 tablet every 4 hours p.r.n. pain.   DISPOSITION: The patient is being discharged on a regular diet with activity as tolerated. She is being transferred to a rehab facility for further pain control with anticipation that she will eventually return home.   CODE STATUS: The patient is full code.  ____________________________ Letta PateJohn B. Danne HarborWalker III, MD [email or web address] D: 12/29/2013 08:19:01 ET Russo: 12/29/2013 08:36:43 ET JOB#: [id number]  cc: Jonny RuizJohn B. Danne HarborWalker III, MD, <Dictator> Elmo PuttJOHN B WALKER III MD ELECTRONICALLY SIGNED 12/30/2013 6:44

## 2015-05-03 ENCOUNTER — Other Ambulatory Visit: Payer: Self-pay | Admitting: Neurology

## 2015-05-04 NOTE — Telephone Encounter (Signed)
ERROR

## 2015-05-17 ENCOUNTER — Telehealth: Payer: Self-pay | Admitting: Neurology

## 2015-05-17 MED ORDER — GABAPENTIN 100 MG PO CAPS: 100.0000 mg | ORAL_CAPSULE | Freq: Two times a day (BID) | ORAL | Status: DC

## 2015-05-17 NOTE — Telephone Encounter (Signed)
Rx has been sent.  Receipt confirmed by pharmacy.   

## 2015-05-17 NOTE — Telephone Encounter (Signed)
Pt request refill on gabapentin (NEURONTIN) 100 MG capsule to [facility] in [place]

## 2015-06-22 ENCOUNTER — Ambulatory Visit (INDEPENDENT_AMBULATORY_CARE_PROVIDER_SITE_OTHER): Payer: Medicare Other | Admitting: Adult Health

## 2015-06-22 ENCOUNTER — Encounter: Payer: Self-pay | Admitting: Adult Health

## 2015-06-22 VITALS — Ht 64.0 in | Wt 117.0 lb

## 2015-06-22 DIAGNOSIS — R413 Other amnesia: Secondary | ICD-10-CM

## 2015-06-22 DIAGNOSIS — M5481 Occipital neuralgia: Secondary | ICD-10-CM

## 2015-06-22 DIAGNOSIS — Z5181 Encounter for therapeutic drug level monitoring: Secondary | ICD-10-CM

## 2015-06-22 DIAGNOSIS — G500 Trigeminal neuralgia: Secondary | ICD-10-CM

## 2015-06-22 MED ORDER — GABAPENTIN 100 MG PO CAPS: ORAL_CAPSULE | ORAL | Status: DC

## 2015-06-22 MED ORDER — CARBAMAZEPINE ER 300 MG PO CP12: 600.0000 mg | ORAL_CAPSULE | Freq: Two times a day (BID) | ORAL | Status: DC

## 2015-06-22 NOTE — Progress Notes (Signed)
I have read the note, and I agree with the clinical assessment and plan.  WILLIS,CHARLES KEITH   

## 2015-06-22 NOTE — Patient Instructions (Signed)
Continue Carbamazepine and Gabapentin- 90 daily prescription sent If your symptoms worsen or you develop new symptoms please let us know.

## 2015-06-22 NOTE — Progress Notes (Signed)
PATIENT: Suzanne Russo DOB: [birth year]  REASON FOR VISIT: follow up- trigeminal neuralgia, occipital neuralgia, memory disturbance HISTORY FROM: patient  HISTORY OF PRESENT ILLNESS: Suzanne Russo is an 81 year old female with a history of trigeminal neuralgia on the right, occipital neuralgia on the left and a mild memory disturbance. The patient continues to take carbamazepine 600 mg twice a day. She is also on gabapentin 100 mg in the a.m. and 200 mg in the PM. She reports that this is controlling her discomfort quite well. Patient denies any changes with her memory. She is able to complete all activities of daily living without difficulty. She operates a Librarian, academic without difficulty. She denies any new neurological symptoms. She returns today for an evaluation.  HISTORY  12/15/14: Suzanne. Russo is an 81 year old right-handed white female with a history of trigeminal neuralgia on the right, occipital neuralgia on the left, and a mild memory disturbance. The patient indicates that she has done quite well with her memory since last seen without much change. The patient has not had any significant issues with the trigeminal neuralgia or with the occipital neuralgia on carbamazepine taking 600 mg twice daily. The patient also is on low-dose gabapentin taking 100 mg the morning and 200 mg in the evening. She is taking this for pain in the right shin area. The patient does find that the medication may make her somewhat drowsy, she will fall sleep if she is inactive. She uses a cane for ambulation, she has not had any recent falls. She has not given up any activities of daily living because of her memory issues at this point. She returns for an evaluation. She does operate a motor vehicle on occasion without difficulty.  REVIEW OF SYSTEMS: Out of a complete 14 system review of symptoms, the patient complains only of the following symptoms, and all other reviewed systems are negative.  See history of present  illness  ALLERGIES: Allergies  Allergen Reactions  . Sulfa Antibiotics Rash  . Tetracycline Rash    HOME MEDICATIONS: Outpatient Prescriptions Prior to Visit  Medication Sig Dispense Refill  . Biotin 1000 MCG tablet Take 1,000 mg by mouth daily.    . Calcium Carbonate-Vitamin D (CALCIUM + D PO) Take by mouth daily.    . carbamazepine (CARBATROL) 300 MG 12 hr capsule TAKE 2 CAPSULES BY MOUTH TWICE DAILY 120 capsule 3  . celecoxib (CELEBREX) 100 MG capsule Take 100 mg by mouth daily.    . Cyanocobalamin (B-12 PO) Take by mouth daily.    Marland Kitchen gabapentin (NEURONTIN) 100 MG capsule Take 1 capsule (100 mg total) by mouth 2 (two) times daily. 180 capsule 1  . hydrocortisone (ANUSOL-HC) 25 MG suppository Place 25 mg rectally as needed.    . hyoscyamine (LEVSIN, ANASPAZ) 0.125 MG tablet Take 0.125 mg by mouth as needed.    Marland Kitchen levothyroxine (SYNTHROID, LEVOTHROID) 100 MCG tablet Take 100 mcg by mouth daily before breakfast.    . loratadine (CLARITIN) 10 MG tablet Take 10 mg by mouth daily.    . Magnesium-Potassium 250-100 MG TABS Take 1 tablet by mouth daily.    . meclizine (ANTIVERT) 25 MG tablet Take 25 mg by mouth every 6 (six) hours as needed.    . mometasone (NASONEX) 50 MCG/ACT nasal spray Place 2 sprays into the nose daily.    . Omega-3 Fatty Acids (FISH OIL) 1000 MG CAPS Take 1,000 mg by mouth 2 (two) times daily.    Marland Kitchen omeprazole (PRILOSEC) 20 MG  capsule Take 20 mg by mouth daily.    . sucralfate (CARAFATE) 1 GM/10ML suspension Take 1 g by mouth 4 (four) times daily as needed.    . vitamin E 400 UNIT capsule Take 400 Units by mouth daily.    . zoledronic acid (RECLAST) 5 MG/100ML SOLN injection Inject 5 mg into the vein once.     No facility-administered medications prior to visit.    PAST MEDICAL HISTORY: Past Medical History  Diagnosis Date  . Trigeminal neuralgia   . Occipital neuralgia   . Carpal tunnel syndrome   . Venous insufficiency   . DVT (deep venous thrombosis)   .  Hypothyroidism   . Osteoporosis   . Hemorrhoids   . Degenerative arthritis   . GERD (gastroesophageal reflux disease)   . Peptic ulcer disease   . Pelvic fracture   . Right clavicle fracture   . Memory difficulty 07/16/2014  . Occipital neuralgia of left side 12/15/2014    PAST SURGICAL HISTORY: Past Surgical History  Procedure Laterality Date  . Left thumb surgery    . Left toe surgery    . Bilateral knee surgery    . Bilateral cataract surgery    . Nasal sinus surgery    . Tonsillectomy    . Vaginal hysterectomy    . Left hip fracture      FAMILY HISTORY: Family History  Problem Relation Age of Onset  . Heart disease Mother   . COPD Father   . Congestive Heart Failure Brother     SOCIAL HISTORY: Social History   Social History  . Marital Status: Married    Spouse Name: N/A  . Number of Children: 3  . Years of Education: college   Occupational History  . Retired    Social History Main Topics  . Smoking status: Former Smoker -- 1.00 packs/day for 45 years    Types: Cigarettes  . Smokeless tobacco: Never Used  . Alcohol Use: Yes     Comment: wine on occasion  . Drug Use: No  . Sexual Activity: Not on file   Other Topics Concern  . Not on file   Social History Narrative   Patient is right handed.   Patient drinks 4 cups caffeine daily         PHYSICAL EXAM  Filed Vitals:   06/22/15 1338  Height: 5\' 4"  (1.626 m)  Weight: 117 lb (53.071 kg)   Body mass index is 20.07 kg/(m^2).   MMSE - Mini Mental State Exam 06/22/2015  Orientation to time 5  Orientation to Place 5  Registration 3  Attention/ Calculation 5  Recall 2  Language- name 2 objects 2  Language- repeat 1  Language- follow 3 step command 3  Language- read & follow direction 1  Write a sentence 1  Copy design 0  Total score 28       Generalized: Well developed, in no acute distress   Neurological examination  Mentation: Alert oriented to time, place, history taking. Follows  all commands speech and language fluent Cranial nerve II-XII: Pupils were equal round reactive to light. Extraocular movements were full, visual field were full on confrontational test. Facial sensation and strength were normal. Uvula tongue midline. Head turning and shoulder shrug  were normal and symmetric. Motor: The motor testing reveals 5 over 5 strength of all 4 extremities. Good symmetric motor tone is noted throughout.  Sensory: Sensory testing is intact to soft touch on all 4 extremities. No evidence of  extinction is noted.  Coordination: Cerebellar testing reveals good finger-nose-finger and heel-to-shin bilaterally.  Gait and station: Gait is normal. Tandem gait is unsteady. Romberg is negative. No drift is seen.  Reflexes: Deep tendon reflexes are symmetric and normal bilaterally.   DIAGNOSTIC DATA (LABS, IMAGING, TESTING) - I reviewed patient records, labs, notes, testing and imaging myself where available.  Lab Results  Component Value Date   WBC 3.9 07/16/2014   HGB 10.4* 07/16/2014   HCT 29.9* 07/16/2014   MCV 91 07/16/2014   PLT 277 07/16/2014      Component Value Date/Time   NA 135 07/16/2014 1528   NA 137 12/26/2013 0554   K 4.1 07/16/2014 1528   K 3.5 12/26/2013 0554   CL 92* 07/16/2014 1528   CL 104 12/26/2013 0554   CO2 27 07/16/2014 1528   CO2 29 12/26/2013 0554   GLUCOSE 86 07/16/2014 1528   GLUCOSE 82 12/26/2013 0554   BUN 10 07/16/2014 1528   BUN 12 12/26/2013 0554   CREATININE 0.66 07/16/2014 1528   CREATININE 0.68 12/30/2013 0637   CALCIUM 8.9 07/16/2014 1528   CALCIUM 8.0* 12/26/2013 0554   PROT 6.3 07/16/2014 1528   PROT 6.4 09/03/2012 2208   ALBUMIN 3.5 09/03/2012 2208   AST 20 07/16/2014 1528   AST 29 09/03/2012 2208   ALT 21 07/16/2014 1528   ALT 21 09/03/2012 2208   ALKPHOS 55 07/16/2014 1528   ALKPHOS 65 09/03/2012 2208   BILITOT 0.3 07/16/2014 1528   BILITOT 0.2 09/03/2012 2208   GFRNONAA 84 07/16/2014 1528   GFRNONAA >60 12/30/2013  0637   GFRAA 96 07/16/2014 1528   GFRAA >60 12/30/2013 0637     ASSESSMENT AND PLAN 81 y.o. year old female  has a past medical history of Trigeminal neuralgia; Occipital neuralgia; Carpal tunnel syndrome; Venous insufficiency; DVT (deep venous thrombosis); Hypothyroidism; Osteoporosis; Hemorrhoids; Degenerative arthritis; GERD (gastroesophageal reflux disease); Peptic ulcer disease; Pelvic fracture; Right clavicle fracture; Memory difficulty (07/16/2014); and Occipital neuralgia of left side (12/15/2014). here with:  1. Trigeminal neuralgia 2. Occipital neuralgia   3. Memory disturbance   Overall the patient is doing well. She has not experienced any discomfort with her trigeminal or occipital neuralgia. She will continue on carbamazepine 600 mg twice a day as well as gabapentin 100 mg in the morning and 200 mg in the evening. I will refill these prescriptions with a 90 day supply today. The patient's memory has remained stable. Her MMSE is 28/30. We will continue to monitor. Patient advised that if her symptoms worsen or she develops new symptoms she should let us know. She will follow-up in 6 months or sooner if needed.    Butch Penny, MSN, NP-C 06/22/2015, 1:30 PM [facility] [address], [address] [place], [place] [address] [phone]

## 2015-06-23 ENCOUNTER — Telehealth: Payer: Self-pay

## 2015-06-23 LAB — COMPREHENSIVE METABOLIC PANEL
ALK PHOS: 63 IU/L (ref 39–117)
ALT: 24 IU/L (ref 0–32)
AST: 23 IU/L (ref 0–40)
Albumin/Globulin Ratio: 1.8 (ref 1.1–2.5)
Albumin: 4.3 g/dL (ref 3.5–4.7)
BUN / CREAT RATIO: 28 — AB (ref 11–26)
BUN: 19 mg/dL (ref 8–27)
Bilirubin Total: 0.2 mg/dL (ref 0.0–1.2)
CHLORIDE: 96 mmol/L — AB (ref 97–108)
CO2: 28 mmol/L (ref 18–29)
CREATININE: 0.69 mg/dL (ref 0.57–1.00)
Calcium: 9.4 mg/dL (ref 8.7–10.3)
GFR calc Af Amer: 94 mL/min/{1.73_m2} (ref >59)
GFR calc non Af Amer: 82 mL/min/{1.73_m2} (ref >59)
GLUCOSE: 93 mg/dL (ref 65–99)
Globulin, Total: 2.4 g/dL (ref 1.5–4.5)
Potassium: 5.5 mmol/L — ABNORMAL HIGH (ref 3.5–5.2)
Sodium: 138 mmol/L (ref 134–144)
Total Protein: 6.7 g/dL (ref 6.0–8.5)

## 2015-06-23 LAB — CBC WITH DIFFERENTIAL/PLATELET
BASOS ABS: 0 10*3/uL (ref 0.0–0.2)
Basos: 1 %
EOS (ABSOLUTE): 0.1 10*3/uL (ref 0.0–0.4)
EOS: 2 %
Hematocrit: 30.5 % — ABNORMAL LOW (ref 34.0–46.6)
Hemoglobin: 9.9 g/dL — ABNORMAL LOW (ref 11.1–15.9)
IMMATURE GRANS (ABS): 0 10*3/uL (ref 0.0–0.1)
Immature Granulocytes: 0 %
LYMPHS ABS: 2.3 10*3/uL (ref 0.7–3.1)
LYMPHS: 43 %
MCH: 30.8 pg (ref 26.6–33.0)
MCHC: 32.5 g/dL (ref 31.5–35.7)
MCV: 95 fL (ref 79–97)
MONOCYTES: 12 %
Monocytes Absolute: 0.6 10*3/uL (ref 0.1–0.9)
Neutrophils Absolute: 2.2 10*3/uL (ref 1.4–7.0)
Neutrophils: 42 %
Platelets: 258 10*3/uL (ref 150–379)
RBC: 3.21 x10E6/uL — AB (ref 3.77–5.28)
RDW: 13.1 % (ref 12.3–15.4)
WBC: 5.2 10*3/uL (ref 3.4–10.8)

## 2015-06-23 LAB — CARBAMAZEPINE LEVEL, TOTAL: CARBAMAZEPINE LVL: 9.9 ug/mL (ref 4.0–12.0)

## 2015-06-23 NOTE — Telephone Encounter (Signed)
-----   Message from Butch Penny, NP sent at 06/23/2015  8:38 AM EDT ----- Potassium elevated. RBC, Hct, Hgb slightly low which is consistent with previous lab work. Carbamazepine is normal. Please forward lab work to PCP and call patient with results. thanks

## 2015-06-23 NOTE — Telephone Encounter (Signed)
Spoke to pt regarding her lab results. Advised her that her potassium was elevated, RBC, H&H were low but this is consistent with previous lab work. He carbamazepine is normal. Pt wishes me to fax these results to her PCP Dr. Dan Humphreys. Pt verbalized understanding.

## 2015-08-24 ENCOUNTER — Telehealth: Payer: Self-pay | Admitting: Neurology

## 2015-08-24 NOTE — Telephone Encounter (Signed)
I called the patient. She states she fell 8/6 and hit her head. She has had a headache off and on ever since. She has had a steady headache for about 5 days. Usually, tylenol helps her headaches but it is not helping now. She states this pain is very different from her trigeminal neuralgia pain.

## 2015-08-24 NOTE — Telephone Encounter (Signed)
I called the patient. The patient fell on 05/15/2015, she has had some headaches off and on since that time. Initially, she was quite sleepy, likely sustained a concussion. The headaches have worsened over the last 5 days, we will get a revisit, the patient likely will need a CT scan of the head.

## 2015-08-24 NOTE — Telephone Encounter (Signed)
Pt called and states she has had a headache for the last 5 days all day, every day. She wants to know if she should come in for a visit or if there is something that Dr. Anne HahnWillis can give to her. Please call and advise [phone]

## 2015-08-25 NOTE — Telephone Encounter (Signed)
I called the patient. Appointment scheduled 11/17.

## 2015-08-26 ENCOUNTER — Encounter: Payer: Self-pay | Admitting: Neurology

## 2015-08-26 ENCOUNTER — Ambulatory Visit (INDEPENDENT_AMBULATORY_CARE_PROVIDER_SITE_OTHER): Payer: Medicare Other | Admitting: Neurology

## 2015-08-26 VITALS — BP 135/64 | HR 69 | Ht 64.0 in | Wt 122.0 lb

## 2015-08-26 DIAGNOSIS — S060X0A Concussion without loss of consciousness, initial encounter: Secondary | ICD-10-CM

## 2015-08-26 DIAGNOSIS — R413 Other amnesia: Secondary | ICD-10-CM | POA: Diagnosis not present

## 2015-08-26 DIAGNOSIS — R51 Headache: Secondary | ICD-10-CM

## 2015-08-26 DIAGNOSIS — R519 Headache, unspecified: Secondary | ICD-10-CM

## 2015-08-26 DIAGNOSIS — M5481 Occipital neuralgia: Secondary | ICD-10-CM | POA: Diagnosis not present

## 2015-08-26 DIAGNOSIS — G500 Trigeminal neuralgia: Secondary | ICD-10-CM | POA: Diagnosis not present

## 2015-08-26 HISTORY — DX: Concussion without loss of consciousness, initial encounter: S06.0X0A

## 2015-08-26 MED ORDER — TRAMADOL HCL 50 MG PO TABS: 50.0000 mg | ORAL_TABLET | Freq: Four times a day (QID) | ORAL | Status: DC | PRN

## 2015-08-26 NOTE — Patient Instructions (Addendum)
We will check blood work today and get CT of the head. You may use Ultram for the headache if needed.   Post-Concussion Syndrome Post-concussion syndrome describes the symptoms that can occur after a head injury. These symptoms can last from weeks to months. CAUSES  It is not clear why some head injuries cause post-concussion syndrome. It can occur whether your head injury was mild or severe and whether you were wearing head protection or not.  SIGNS AND SYMPTOMS  Memory difficulties.  Dizziness.  Headaches.  Double vision or blurry vision.  Sensitivity to light.  Hearing difficulties.  Depression.  Tiredness.  Weakness.  Difficulty with concentration.  Difficulty sleeping or staying asleep.  Vomiting.  Poor balance or instability on your feet.  Slow reaction time.  Difficulty learning and remembering things you have heard. DIAGNOSIS  There is no test to determine whether you have post-concussion syndrome. Your health care provider may order an imaging scan of your brain, such as a CT scan, to check for other problems that may be causing your symptoms (such as a severe injury inside your skull). TREATMENT  Usually, these problems disappear over time without medical care. Your health care provider may prescribe medicine to help ease your symptoms. It is important to follow up with a neurologist to evaluate your recovery and address any lingering symptoms or issues. HOME CARE INSTRUCTIONS   Take medicines only as directed by your health care provider. Do not take aspirin. Aspirin can slow blood clotting.  Sleep with your head slightly elevated to help with headaches.  Avoid any situation where there is potential for another head injury. This includes football, hockey, soccer, basketball, martial arts, downhill snow sports, and horseback riding. Your condition will get worse every time you experience a concussion. You should avoid these activities until you are  evaluated by the appropriate follow-up health care providers.  Keep all follow-up visits as directed by your health care provider. This is important. SEEK MEDICAL CARE IF:  You have increased problems paying attention or concentrating.  You have increased difficulty remembering or learning new information.  You need more time to complete tasks or assignments than before.  You have increased irritability or decreased ability to cope with stress.  You have more symptoms than before. Seek medical care if you have any of the following symptoms for more than two weeks after your injury:  Lasting (chronic) headaches.  Dizziness or balance problems.  Nausea.  Vision problems.  Increased sensitivity to noise or light.  Depression or mood swings.  Anxiety or irritability.  Memory problems.  Difficulty concentrating or paying attention.  Sleep problems.  Feeling tired all the time. SEEK IMMEDIATE MEDICAL CARE IF:  You have confusion or unusual drowsiness.  Others find it difficult to wake you up.  You have nausea or persistent, forceful vomiting.  You feel like you are moving when you are not (vertigo). Your eyes may move rapidly back and forth.  You have convulsions or faint.  You have severe, persistent headaches that are not relieved by medicine.  You cannot use your arms or legs normally.  One of your pupils is larger than the other.  You have clear or bloody discharge from your nose or ears.  Your problems are getting worse, not better. MAKE SURE YOU:  Understand these instructions.  Will watch your condition.  Will get help right away if you are not doing well or get worse.   This information is not intended to replace  advice given to you by your health care provider. Make sure you discuss any questions you have with your health care provider.   Document Released: 03/17/2002 Document Revised: 10/16/2014 Document Reviewed: 12/31/2013 Elsevier  Interactive Patient Education [employer]

## 2015-08-26 NOTE — Progress Notes (Signed)
Reason for visit: Headache  Suzanne Russo is an 81 y.o. female  History of present illness:  Suzanne Russo is an 81 year old right-handed white female with a history of trigeminal neuralgia and left occipital neuralgia. The patient has been on carbamazepine and low-dose gabapentin. She has not been able to tolerate more than the 100 mg dosing during the day of the gabapentin secondary to drowsiness. On 05/15/2015, she fell backwards and struck the floor, she did not sustain loss of consciousness. The patient was noted to have some headache for about a week after the fall, and she felt somewhat sleepy. The patient seemed to do better with her headaches, but within the last week the headaches have increased. The patient is having daily headaches at this time, the headaches are primarily in the occipital area, and are different from her occipital neuralgia and may also be in the temporal regions and on the top of her head. The patient denies any pain in the neck or pain going up from the neck into the head. She has had occasional nausea, no vomiting. She feels somewhat "fuzzy headed" with the headaches. The patient is taking Tylenol for the headache without much benefit. The patient feels that her balance has somewhat worsened recently. She has not had any further falls, she walks with a cane. The patient has a chronic "dropped head syndrome". This has not changed. The patient denies any numbness or weakness of the extremities, she denies any night sweats, weight loss, fevers or chills. She does report some dizziness with the headache. She has not had any visual changes. She comes to this office for an evaluation.  Past Medical History  Diagnosis Date  . Trigeminal neuralgia   . Occipital neuralgia   . Carpal tunnel syndrome   . Venous insufficiency   . DVT (deep venous thrombosis) (HCC)   . Hypothyroidism   . Osteoporosis   . Hemorrhoids   . Degenerative arthritis   . GERD (gastroesophageal reflux  disease)   . Peptic ulcer disease   . Pelvic fracture (HCC)   . Right clavicle fracture   . Memory difficulty 07/16/2014  . Occipital neuralgia of left side 12/15/2014  . Concussion with no loss of consciousness 08/26/2015    Past Surgical History  Procedure Laterality Date  . Left thumb surgery    . Left toe surgery    . Bilateral knee surgery    . Bilateral cataract surgery    . Nasal sinus surgery    . Tonsillectomy    . Vaginal hysterectomy    . Left hip fracture      Family History  Problem Relation Age of Onset  . Heart disease Mother   . COPD Father   . Congestive Heart Failure Brother     Social history:  reports that she has quit smoking. Her smoking use included Cigarettes. She has a 45 pack-year smoking history. She has never used smokeless tobacco. She reports that she drinks about 4.2 oz of alcohol per week. She reports that she does not use illicit drugs.    Allergies  Allergen Reactions  . Sulfa Antibiotics Rash  . Tetracycline Rash    Medications:  Prior to Admission medications   Medication Sig Start Date End Date Taking? Authorizing Provider  Biotin 1000 MCG tablet Take 1,000 mg by mouth daily.   Yes Historical Provider, MD  Calcium Carbonate-Vitamin D (CALCIUM + D PO) Take by mouth daily.   Yes Historical Provider, MD  carbamazepine (CARBATROL) 300 MG 12 hr capsule Take 2 capsules (600 mg total) by mouth 2 (two) times daily. 06/22/15  Yes Butch Penny, NP  Cyanocobalamin (B-12 PO) Take by mouth daily.   Yes Historical Provider, MD  gabapentin (NEURONTIN) 100 MG capsule Take 1 capsule in the morning and two capsules at bedtime 06/22/15  Yes Butch Penny, NP  hydrocortisone (ANUSOL-HC) 25 MG suppository Place 25 mg rectally as needed.   Yes Historical Provider, MD  levothyroxine (SYNTHROID, LEVOTHROID) 100 MCG tablet Take 100 mcg by mouth daily before breakfast.   Yes Historical Provider, MD  loratadine (CLARITIN) 10 MG tablet Take 10 mg by mouth daily.    Yes Historical Provider, MD  Magnesium-Potassium 250-100 MG TABS Take 1 tablet by mouth daily.   Yes Historical Provider, MD  meclizine (ANTIVERT) 25 MG tablet Take 25 mg by mouth every 6 (six) hours as needed.   Yes Historical Provider, MD  mometasone (NASONEX) 50 MCG/ACT nasal spray Place 2 sprays into the nose daily.   Yes Historical Provider, MD  Omega-3 Fatty Acids (FISH OIL) 1000 MG CAPS Take 1,000 mg by mouth 2 (two) times daily.   Yes Historical Provider, MD  omeprazole (PRILOSEC) 20 MG capsule Take 20 mg by mouth daily.   Yes Historical Provider, MD  sucralfate (CARAFATE) 1 GM/10ML suspension Take 1 g by mouth 4 (four) times daily as needed. 06/17/14  Yes Historical Provider, MD  vitamin E 400 UNIT capsule Take 400 Units by mouth daily.   Yes Historical Provider, MD  zoledronic acid (RECLAST) 5 MG/100ML SOLN injection Inject 5 mg into the vein once.   Yes Historical Provider, MD  celecoxib (CELEBREX) 200 MG capsule Take 200 mg by mouth daily. 08/19/15   Historical Provider, MD  hyoscyamine (LEVSIN, ANASPAZ) 0.125 MG tablet Take 0.125 mg by mouth as needed. 07/16/14 07/26/14  Historical Provider, MD    ROS:  Out of a complete 14 system review of symptoms, the patient complains only of the following symptoms, and all other reviewed systems are negative.  Headache Gait instability Dizziness  Blood pressure 135/64, pulse 69, height  (1.626 m), weight 122 lb (55.339 kg).  Physical Exam  General: The patient is alert and cooperative at the time of the examination.  Skin: No significant peripheral edema is noted.   Neurologic Exam  Mental status: The patient is alert and oriented x 3 at the time of the examination. The patient has apparent normal recent and remote memory, with an apparently normal attention span and concentration ability.   Cranial nerves: Facial symmetry is present. Speech is normal, no aphasia or dysarthria is noted. Extraocular movements are full. Visual  fields are full.  Motor: The patient has good strength in all 4 extremities.  Sensory examination: Soft touch sensation is symmetric on the face, arms, and legs.  Coordination: The patient has good finger-nose-finger and heel-to-shin bilaterally.  Gait and station: The patient has a slightly wide-based gait, lightly unsteady. She uses a cane for ambulation. Tandem gait was not attempted. Romberg is negative.  Reflexes: Deep tendon reflexes are symmetric.   Assessment/Plan:  1. History of trigeminal neuralgia  2. History of left occipital neuralgia  3. Recent fall, post concussive syndrome  4. Headache  5. History of memory disturbance  The patient likely sustained a concussion with the fall. She initially had improvement of the headache, but the headache has worsened recently. She will require further evaluation to exclude a possible subdural hematoma. The patient will  undergo blood work for a sedimentation rate and CRP. She will be given Ultram if needed for the pain, she has not tolerated prednisone dosing previously. She will follow-up for her next scheduled revisit. I will contact her concerning the results of the above.  Marlan Palau. Keith Carmelita Amparo MD 08/26/2015 5:09 PM  [facility] [address] [place], [place] [id number]  Phone [phone] Fax [phone]

## 2015-08-27 LAB — C-REACTIVE PROTEIN: CRP: 0.6 mg/L (ref 0.0–4.9)

## 2015-08-27 LAB — SEDIMENTATION RATE: Sed Rate: 2 mm/hr (ref 0–40)

## 2015-09-14 ENCOUNTER — Ambulatory Visit
Admission: RE | Admit: 2015-09-14 | Discharge: 2015-09-14 | Disposition: A | Discharge status: Home / Self Care | Payer: Medicare Other | Source: Ambulatory Visit | Attending: Neurology | Admitting: Neurology

## 2015-09-14 ENCOUNTER — Telehealth: Payer: Self-pay | Admitting: Neurology

## 2015-09-14 DIAGNOSIS — I739 Peripheral vascular disease, unspecified: Secondary | ICD-10-CM | POA: Insufficient documentation

## 2015-09-14 DIAGNOSIS — R413 Other amnesia: Secondary | ICD-10-CM

## 2015-09-14 DIAGNOSIS — S060X0A Concussion without loss of consciousness, initial encounter: Secondary | ICD-10-CM | POA: Diagnosis present

## 2015-09-14 DIAGNOSIS — G500 Trigeminal neuralgia: Secondary | ICD-10-CM

## 2015-09-14 DIAGNOSIS — R51 Headache: Secondary | ICD-10-CM | POA: Insufficient documentation

## 2015-09-14 DIAGNOSIS — M5481 Occipital neuralgia: Secondary | ICD-10-CM

## 2015-09-14 DIAGNOSIS — W19XXXA Unspecified fall, initial encounter: Secondary | ICD-10-CM | POA: Insufficient documentation

## 2015-09-14 DIAGNOSIS — R519 Headache, unspecified: Secondary | ICD-10-CM

## 2015-09-14 NOTE — Telephone Encounter (Signed)
I called patient. The CT scan of brain does not show evidence of a subdural, no other reason for the headache. The patient is still having about 5 days a week with headache. The headache may migrate about the head, be on the front or the back. She is on gabapentin, we will increase the dose taking 100 mg capsules, 2 in the morning and 2 in the evening. We can go up to 2 in the morning and 3 in the evening after a week if she is still having problems.   CT head 09/14/15:  IMPRESSION: No skull fracture or intracranial hemorrhage.  Moderate small vessel disease type changes without CT evidence of large acute infarct.  Superior posterior projection of the dens with crowding of the cervical medullary junction.  No intracranial mass lesion noted on this unenhanced exam.

## 2015-12-22 ENCOUNTER — Telehealth: Payer: Self-pay | Admitting: Adult Health

## 2015-12-22 ENCOUNTER — Ambulatory Visit (INDEPENDENT_AMBULATORY_CARE_PROVIDER_SITE_OTHER): Payer: Medicare Other | Admitting: Adult Health

## 2015-12-22 ENCOUNTER — Encounter: Payer: Self-pay | Admitting: Adult Health

## 2015-12-22 ENCOUNTER — Ambulatory Visit
Admission: RE | Admit: 2015-12-22 | Discharge: 2015-12-22 | Disposition: A | Discharge status: Home / Self Care | Payer: Medicare Other | Source: Ambulatory Visit | Attending: Adult Health | Admitting: Adult Health

## 2015-12-22 VITALS — BP 144/78 | Ht 64.0 in | Wt 124.0 lb

## 2015-12-22 DIAGNOSIS — W19XXXA Unspecified fall, initial encounter: Secondary | ICD-10-CM

## 2015-12-22 DIAGNOSIS — Z5181 Encounter for therapeutic drug level monitoring: Secondary | ICD-10-CM | POA: Diagnosis not present

## 2015-12-22 DIAGNOSIS — G500 Trigeminal neuralgia: Secondary | ICD-10-CM

## 2015-12-22 DIAGNOSIS — M5481 Occipital neuralgia: Secondary | ICD-10-CM

## 2015-12-22 DIAGNOSIS — M79642 Pain in left hand: Secondary | ICD-10-CM

## 2015-12-22 MED ORDER — CARBAMAZEPINE ER 300 MG PO CP12: 600.0000 mg | ORAL_CAPSULE | Freq: Two times a day (BID) | ORAL | Status: DC

## 2015-12-22 MED ORDER — GABAPENTIN 100 MG PO CAPS: ORAL_CAPSULE | ORAL | Status: DC

## 2015-12-22 NOTE — Progress Notes (Signed)
PATIENT: Suzanne Russo DOB: 08/27/1932  REASON FOR VISIT: follow up HISTORY FROM: patient  HISTORY OF PRESENT ILLNESS: Suzanne Russo is a 82 year old female with a history of trigeminal neuralgia, left occipital neuralgia, mild memory impairment and headaches. She returns today for an evaluation. The patient reports that as long she takes carbamazepine and gabapentin she does not have any discomfort associated with her trigeminal or occipital neuralgia. At the last visit she has suffered a fall and hit her head. She states that in the last 3-4 weeks her headaches have improved drastically. She did increase her gabapentin to 1 tablet in the morning and 2 tablets in the evening. She states that she cannot tolerate higher doses of gabapentin during the day due to drowsiness. The patient fell out of a rolling chair last week. Her daughter states that this was due to the chair. Fortunately the patient did not hit her head but she caught herself on her left hand. Since then the left lateral portion of the hand has been swollen. She states the bruising has improved. She reports that it is very painful especially touching the lateral aspect of the hand. She reports that she is having a hard time gripping objects. The patient also reports that on Sunday she was sitting in her chair reading the paper and she felt as if she was going to a fall out of the chair but was able to she is unsure of what happened. She denies any weakness in the extremities. No slurred speech or facial droop noted. The patient states that she has not had any additional symptoms. Her daughter reports that she has been known to fall asleep sitting at the table. The patient is unsure if this happened or not. She states that since then she has felt fine. She returns today for an evaluation.  HISTORY 08/26/15: Suzanne Russo is an 82 year old right-handed white female with a history of trigeminal neuralgia and left occipital neuralgia. The patient has  been on carbamazepine and low-dose gabapentin. She has not been able to tolerate more than the 100 mg dosing during the day of the gabapentin secondary to drowsiness. On 05/15/2015, she fell backwards and struck the floor, she did not sustain loss of consciousness. The patient was noted to have some headache for about a week after the fall, and she felt somewhat sleepy. The patient seemed to do better with her headaches, but within the last week the headaches have increased. The patient is having daily headaches at this time, the headaches are primarily in the occipital area, and are different from her occipital neuralgia and may also be in the temporal regions and on the top of her head. The patient denies any pain in the neck or pain going up from the neck into the head. She has had occasional nausea, no vomiting. She feels somewhat "fuzzy headed" with the headaches. The patient is taking Tylenol for the headache without much benefit. The patient feels that her balance has somewhat worsened recently. She has not had any further falls, she walks with a cane. The patient has a chronic "dropped head syndrome". This has not changed. The patient denies any numbness or weakness of the extremities, she denies any night sweats, weight loss, fevers or chills. She does report some dizziness with the headache. She has not had any visual changes. She comes to this office for an evaluation.   REVIEW OF SYSTEMS: Out of a complete 14 system review of symptoms, the patient complains  only of the following symptoms, and all other reviewed systems are negative.  Acting out dreams ALLERGIES: Allergies  Allergen Reactions  . Sulfa Antibiotics Rash  . Tetracycline Rash    HOME MEDICATIONS: Outpatient Prescriptions Prior to Visit  Medication Sig Dispense Refill  . Biotin 1000 MCG tablet Take 1,000 mg by mouth daily.    . Calcium Carbonate-Vitamin D (CALCIUM + D PO) Take by mouth daily.    . celecoxib (CELEBREX) 200 MG  capsule Take 200 mg by mouth daily.  2  . Cyanocobalamin (B-12 PO) Take by mouth daily.    . hydrocortisone (ANUSOL-HC) 25 MG suppository Place 25 mg rectally as needed.    Marland Kitchen. levothyroxine (SYNTHROID, LEVOTHROID) 100 MCG tablet Take 100 mcg by mouth daily before breakfast.    . loratadine (CLARITIN) 10 MG tablet Take 10 mg by mouth daily.    . Magnesium-Potassium 250-100 MG TABS Take 1 tablet by mouth daily.    . meclizine (ANTIVERT) 25 MG tablet Take 25 mg by mouth every 6 (six) hours as needed.    . mometasone (NASONEX) 50 MCG/ACT nasal spray Place 2 sprays into the nose daily.    . Omega-3 Fatty Acids (FISH OIL) 1000 MG CAPS Take 1,000 mg by mouth 2 (two) times daily.    Marland Kitchen. omeprazole (PRILOSEC) 20 MG capsule Take 20 mg by mouth daily.    . sucralfate (CARAFATE) 1 GM/10ML suspension Take 1 g by mouth 4 (four) times daily as needed.    . traMADol (ULTRAM) 50 MG tablet Take 1 tablet (50 mg total) by mouth every 6 (six) hours as needed. 30 tablet 1  . vitamin E 400 UNIT capsule Take 400 Units by mouth daily.    . zoledronic acid (RECLAST) 5 MG/100ML SOLN injection Inject 5 mg into the vein once.    . carbamazepine (CARBATROL) 300 MG 12 hr capsule Take 2 capsules (600 mg total) by mouth 2 (two) times daily. 360 capsule 4  . gabapentin (NEURONTIN) 100 MG capsule Take 1 capsule in the morning and two capsules at bedtime (Patient taking differently: Take 200 mg by mouth 2 (two) times daily. ) 270 capsule 4  . hyoscyamine (LEVSIN, ANASPAZ) 0.125 MG tablet Take 0.125 mg by mouth as needed.     No facility-administered medications prior to visit.    PAST MEDICAL HISTORY: Past Medical History  Diagnosis Date  . Trigeminal neuralgia   . Occipital neuralgia   . Carpal tunnel syndrome   . Venous insufficiency   . DVT (deep venous thrombosis) (HCC)   . Hypothyroidism   . Osteoporosis   . Hemorrhoids   . Degenerative arthritis   . GERD (gastroesophageal reflux disease)   . Peptic ulcer disease     . Pelvic fracture (HCC)   . Right clavicle fracture   . Memory difficulty 07/16/2014  . Occipital neuralgia of left side 12/15/2014  . Concussion with no loss of consciousness 08/26/2015    PAST SURGICAL HISTORY: Past Surgical History  Procedure Laterality Date  . Left thumb surgery    . Left toe surgery    . Bilateral knee surgery    . Bilateral cataract surgery    . Nasal sinus surgery    . Tonsillectomy    . Vaginal hysterectomy    . Left hip fracture      FAMILY HISTORY: Family History  Problem Relation Age of Onset  . Heart disease Mother   . COPD Father   . Congestive Heart Failure Brother  SOCIAL HISTORY: Social History   Social History  . Marital Status: Married    Spouse Name: N/A  . Number of Children: 3  . Years of Education: college   Occupational History  . Retired    Social History Main Topics  . Smoking status: Former Smoker -- 1.00 packs/day for 45 years    Types: Cigarettes  . Smokeless tobacco: Never Used  . Alcohol Use: 4.2 oz/week    7 Glasses of wine per week     Comment: wine on occasion  . Drug Use: No  . Sexual Activity: Not on file   Other Topics Concern  . Not on file   Social History Narrative   Patient is right handed.   Patient drinks 3 cups caffeine daily         PHYSICAL EXAM  Filed Vitals:   12/22/15 1256  BP: 144/78  Height:  (1.626 m)  Weight: 124 lb (56.246 kg)   Body mass index is 21.27 kg/(m^2).   MMSE - Mini Mental State Exam 12/22/2015 06/22/2015  Orientation to time 5 5  Orientation to Place 5 5  Registration 3 3  Attention/ Calculation 0 5  Recall 1 2  Language- name 2 objects 2 2  Language- repeat 1 1  Language- follow 3 step command 3 3  Language- read & follow direction 1 1  Write a sentence 1 1  Copy design 1 0  Total score 23 28    Generalized: Well developed, in no acute distress   Neurological examination  Mentation: Alert oriented to time, place, history taking. Follows all  commands speech and language fluent Cranial nerve II-XII: Pupils were equal round reactive to light. Extraocular movements were full, visual field were full on confrontational test. Facial sensation and strength were normal. Uvula tongue midline. Head turning and shoulder shrug  were normal and symmetric. Motor: The motor testing reveals 5 over 5 strength of all 4 extremities. Decreased grip strength in the left hand. Good symmetric motor tone is noted throughout. Swelling on the lateral aspect of the left hand. No bruising noted. Sensory: Sensory testing is intact to soft touch on all 4 extremities. No evidence of extinction is noted.  Coordination: Cerebellar testing reveals good finger-nose-finger and heel-to-shin bilaterally.  Gait and station: Patient uses a cane when ambulating.  Reflexes: Deep tendon reflexes are symmetric and normal bilaterally.   DIAGNOSTIC DATA (LABS, IMAGING, TESTING) - I reviewed patient records, labs, notes, testing and imaging myself where available.  Lab Results  Component Value Date   WBC 5.2 06/22/2015   HGB 10.4* 07/16/2014   HCT 30.5* 06/22/2015   MCV 95 06/22/2015   PLT 258 06/22/2015      Component Value Date/Time   NA 138 06/22/2015 1406   NA 137 12/26/2013 0554   K 5.5* 06/22/2015 1406   K 3.5 12/26/2013 0554   CL 96* 06/22/2015 1406   CL 104 12/26/2013 0554   CO2 28 06/22/2015 1406   CO2 29 12/26/2013 0554   GLUCOSE 93 06/22/2015 1406   GLUCOSE 82 12/26/2013 0554   BUN 19 06/22/2015 1406   BUN 12 12/26/2013 0554   CREATININE 0.69 06/22/2015 1406   CREATININE 0.68 12/30/2013 0637   CALCIUM 9.4 06/22/2015 1406   CALCIUM 8.0* 12/26/2013 0554   PROT 6.7 06/22/2015 1406   PROT 6.4 09/03/2012 2208   ALBUMIN 4.3 06/22/2015 1406   ALBUMIN 3.5 09/03/2012 2208   AST 23 06/22/2015 1406   AST 29 09/03/2012  2208   ALT 24 06/22/2015 1406   ALT 21 09/03/2012 2208   ALKPHOS 63 06/22/2015 1406   ALKPHOS 65 09/03/2012 2208   BILITOT 0.2 06/22/2015  1406   BILITOT 0.3 07/16/2014 1528   BILITOT 0.2 09/03/2012 2208   GFRNONAA 82 06/22/2015 1406   GFRNONAA >60 12/30/2013 0637   GFRAA 94 06/22/2015 1406   GFRAA >60 12/30/2013 0637      ASSESSMENT AND PLAN 82 y.o. year old female  has a past medical history of Trigeminal neuralgia; Occipital neuralgia; Carpal tunnel syndrome; Venous insufficiency; DVT (deep venous thrombosis) (HCC); Hypothyroidism; Osteoporosis; Hemorrhoids; Degenerative arthritis; GERD (gastroesophageal reflux disease); Peptic ulcer disease; Pelvic fracture (HCC); Right clavicle fracture; Memory difficulty (07/16/2014); Occipital neuralgia of left side (12/15/2014); and Concussion with no loss of consciousness (08/26/2015). here with:  1. Trigeminal neuralgia 2. Occipital neuralgia left 3. Left hand pain due to fall  The patient will remain on carbamazepine and gabapentin. I have sent in for a refill. I will check blood work today. The patient has continued to have swelling and pain in the left hand after a fall. I will send her for an x-ray today. Patient advised that if her symptoms worsen or she develops any new symptoms she should let us know. She will follow-up in 6 months or sooner if needed.     Butch Penny, MSN, NP-C 12/22/2015, 1:56 PM [facility] [address], [address] [place], [place] [address] [phone]

## 2015-12-22 NOTE — Progress Notes (Signed)
I have read the note, and I agree with the clinical assessment and plan.  WILLIS,CHARLES KEITH   

## 2015-12-22 NOTE — Telephone Encounter (Signed)
I called the patient. Her x-ray shows that she does have a fracture of the fifth metacarpal with mild angulation. She would like me to forward this to her orthopedist.

## 2015-12-22 NOTE — Patient Instructions (Signed)
Continue gabapentin and carbamazepine Blood work today X-ray of left hand If your symptoms worsen or you develop new symptoms please let us know.

## 2015-12-23 ENCOUNTER — Telehealth: Payer: Self-pay

## 2015-12-23 LAB — COMPREHENSIVE METABOLIC PANEL
ALBUMIN: 4.2 g/dL (ref 3.5–4.7)
ALK PHOS: 53 IU/L (ref 39–117)
ALT: 23 IU/L (ref 0–32)
AST: 26 IU/L (ref 0–40)
Albumin/Globulin Ratio: 1.8 (ref 1.2–2.2)
BILIRUBIN TOTAL: 0.4 mg/dL (ref 0.0–1.2)
BUN / CREAT RATIO: 22 (ref 11–26)
BUN: 14 mg/dL (ref 8–27)
CO2: 28 mmol/L (ref 18–29)
CREATININE: 0.65 mg/dL (ref 0.57–1.00)
Calcium: 9.1 mg/dL (ref 8.7–10.3)
Chloride: 96 mmol/L (ref 96–106)
GFR calc Af Amer: 96 mL/min/{1.73_m2} (ref >59)
GFR, EST NON AFRICAN AMERICAN: 84 mL/min/{1.73_m2} (ref >59)
GLOBULIN, TOTAL: 2.3 g/dL (ref 1.5–4.5)
Glucose: 89 mg/dL (ref 65–99)
Potassium: 5.1 mmol/L (ref 3.5–5.2)
Sodium: 135 mmol/L (ref 134–144)
Total Protein: 6.5 g/dL (ref 6.0–8.5)

## 2015-12-23 LAB — CBC WITH DIFFERENTIAL/PLATELET
BASOS ABS: 0 10*3/uL (ref 0.0–0.2)
Basos: 1 %
EOS (ABSOLUTE): 0.1 10*3/uL (ref 0.0–0.4)
EOS: 2 %
HEMATOCRIT: 28.7 % — AB (ref 34.0–46.6)
Hemoglobin: 9.7 g/dL — ABNORMAL LOW (ref 11.1–15.9)
Immature Grans (Abs): 0 10*3/uL (ref 0.0–0.1)
Immature Granulocytes: 0 %
LYMPHS ABS: 1.8 10*3/uL (ref 0.7–3.1)
Lymphs: 35 %
MCH: 30 pg (ref 26.6–33.0)
MCHC: 33.8 g/dL (ref 31.5–35.7)
MCV: 89 fL (ref 79–97)
MONOS ABS: 0.6 10*3/uL (ref 0.1–0.9)
Monocytes: 12 %
Neutrophils Absolute: 2.7 10*3/uL (ref 1.4–7.0)
Neutrophils: 50 %
Platelets: 268 10*3/uL (ref 150–379)
RBC: 3.23 x10E6/uL — AB (ref 3.77–5.28)
RDW: 13.6 % (ref 12.3–15.4)
WBC: 5.3 10*3/uL (ref 3.4–10.8)

## 2015-12-23 LAB — CARBAMAZEPINE LEVEL, TOTAL: Carbamazepine (Tegretol), S: 8.7 ug/mL (ref 4.0–12.0)

## 2015-12-23 NOTE — Telephone Encounter (Signed)
Called and spoke to patient and her husband . Patient relayed she has had her X-RAY and her her PCP is going to set everything up as far as her Orthopaedic apt. I relayed to her is she needed anything to please call the back. Patient and her husband understood.

## 2015-12-23 NOTE — Telephone Encounter (Signed)
-----   Message from Butch PennyMegan Millikan, NP sent at 12/23/2015  2:49 PM EDT ----- Lab work ok. RBC, Hgb and hct decreased but consistent with previous lab work. call patient with results.

## 2015-12-23 NOTE — Telephone Encounter (Signed)
Spoke to pt and advised her that her labs look ok, but her H&H and RBC decreased, but it is consistent with her previous lab work. She says that this has been going on for years. She asked me to fax a copy of her labs and xray results to Dr. Dan HumphreysWalker. Pt verbalized understanding.

## 2015-12-23 NOTE — Telephone Encounter (Signed)
Annabelle HarmanDana, can you find out who this pt's orthopedist is, and then find out if that office needs a referral for us to get this pt seen for a broken hand or can the pt just call and schedule an appt?  If there is a referral needed, just let us know.

## 2015-12-30 ENCOUNTER — Ambulatory Visit: Payer: Medicare Other | Attending: Orthopedic Surgery | Admitting: Occupational Therapy

## 2015-12-30 DIAGNOSIS — M79645 Pain in left finger(s): Secondary | ICD-10-CM | POA: Diagnosis present

## 2015-12-30 NOTE — Therapy (Signed)
[facility] PHYSICAL AND SPORTS MEDICINE [address] [place], [place], [address] Phone: [phone]   Fax:  [phone]  Occupational Therapy Treatment  Patient Details  Name: Suzanne Pepperancy T Gorelik MRN: [id number] Date of Birth: 1931-12-01 Referring Provider: Cranston Neighborhris Gaines  Encounter Date: 12/30/2015      OT End of Session - 12/30/15 0949    Visit Number 1   Number of Visits 3   Date for OT Re-Evaluation 01/20/16   OT Start Time 0905   OT Stop Time 0939   OT Time Calculation (min) 34 min   Activity Tolerance Patient tolerated treatment well   Behavior During Therapy [facility] for tasks assessed/performed      Past Medical History  Diagnosis Date  . Trigeminal neuralgia   . Occipital neuralgia   . Carpal tunnel syndrome   . Venous insufficiency   . DVT (deep venous thrombosis) (HCC)   . Hypothyroidism   . Osteoporosis   . Hemorrhoids   . Degenerative arthritis   . GERD (gastroesophageal reflux disease)   . Peptic ulcer disease   . Pelvic fracture (HCC)   . Right clavicle fracture   . Memory difficulty 07/16/2014  . Occipital neuralgia of left side 12/15/2014  . Concussion with no loss of consciousness 08/26/2015    Past Surgical History  Procedure Laterality Date  . Left thumb surgery    . Left toe surgery    . Bilateral knee surgery    . Bilateral cataract surgery    . Nasal sinus surgery    . Tonsillectomy    . Vaginal hysterectomy    . Left hip fracture      There were no vitals filed for this visit.  Visit Diagnosis:  Pain of finger of left hand - Plan: Ot plan of care cert/re-cert      Subjective Assessment - 12/30/15 0944    Subjective  I fell out of the chair in our basement - did not had armrests - seen the Dr  and send over here    Patient Stated Goals If I can get my finger healed and  splint off    Currently in Pain? No/denies            [facility] Assessment - 12/30/15 0001    Assessment   Diagnosis L 5th MC neck  fracture    Referring Provider Cranston Neighborhris Gaines   Onset Date 12/22/15   Assessment pt present with soft forearm gutter cast - refer for custum hand base ulnar gutter splint    Home  Environment   Lives With Spouse   Prior Function   Level of Independence Independent   Leisure R hand dominant- retired, Scientist, physiologicalwatch tv, read , cook some , laundry        Custom hand base ulnar gutter splint fabricated including 4th and 5th digits - wrist free , and DIP of 4th digit free - pt and husband ed on wearing and donning/doffing  As well as precautions for any redness  Keep on per MD orders - do not want do use if or do ROM - until next appt with MD                   OT Education - 12/30/15 0949    Education provided Yes   Education Details splint wearing , precautions    Person(s) Educated Patient   Methods Explanation;Demonstration;Tactile cues;Verbal cues   Comprehension Verbal cues required;Returned demonstration;Verbalized understanding  OT Short Term Goals - 05-Jan-2016 1610    OT SHORT TERM GOAL #1   Title Pt and husband ind in donning and doffing  as well as wearing of splint to stabilize fracture site .   Baseline showed and verbalize understanding    Time 3   Period Weeks   Status New           OT Long Term Goals - 01-05-2016 0953    OT LONG TERM GOAL #1   Title Pt to be able to wear splint until appt with MD without any pressure and red areas   Baseline ed on precautions                Plan - 01-05-2016 0950    Clinical Impression Statement Pt present about week to 10 days from fall out of chair - fracture L 5th MC neck - refer for custom hand base ulnar gutter splint to include 4th and 5th - DIP free of 4th and wrist for AROM - pt and husband ed on using and wearing of splints  - pt to see MD for follow up on the 10th April    Pt will benefit from skilled therapeutic intervention in order to improve on the following deficits (Retired) Impaired  flexibility;Decreased range of motion   Rehab Potential Good   OT Frequency Other (comment)   Plan pt to phone if any issues with splint - other wise see MD and if need OT for ROM - phone   OT Home Exercise Plan see pt instruction    Consulted and Agree with Plan of Care Patient          G-Codes - 01-05-16 0954    Functional Assessment Tool Used Splinting , history assess , and clnicial judgement   Functional Limitation Self care   Self Care Current Status (R6045) At least 20 percent but less than 40 percent impaired, limited or restricted   Self Care Goal Status (W0981) At least 1 percent but less than 20 percent impaired, limited or restricted      Problem List Patient Active Problem List   Diagnosis Date Noted  . Headache disorder 08/26/2015  . Concussion with no loss of consciousness 08/26/2015  . Occipital neuralgia of left side 12/15/2014  . Memory difficulty 07/16/2014  . Trigeminal neuralgia 07/11/2013    Oletta Cohn OTR/L,CLT  01/05/16, 9:57 AM  [facility] PHYSICAL AND SPORTS MEDICINE [address], [place], [address] Phone: [phone]   Fax:  [phone]  Name: Suzanne Russo MRN: [id number] Date of Birth: 08-26-1932

## 2015-12-30 NOTE — Patient Instructions (Signed)
Custom hand base ulnar gutter splint for 4th and 5th digits - wrist free , and DIP of 4th digit free - pt and husband ed on wearing and donning/doffing  As well as precautions for any redness  Keep on per MD orders - do not want do use if or do ROM - until next appt with MD

## 2016-06-26 ENCOUNTER — Encounter: Payer: Self-pay | Admitting: Adult Health

## 2016-06-26 ENCOUNTER — Ambulatory Visit (INDEPENDENT_AMBULATORY_CARE_PROVIDER_SITE_OTHER): Payer: Medicare Other | Admitting: Adult Health

## 2016-06-26 VITALS — BP 129/73 | HR 61 | Ht 64.0 in | Wt 121.2 lb

## 2016-06-26 DIAGNOSIS — M5481 Occipital neuralgia: Secondary | ICD-10-CM | POA: Diagnosis not present

## 2016-06-26 DIAGNOSIS — Z5181 Encounter for therapeutic drug level monitoring: Secondary | ICD-10-CM

## 2016-06-26 DIAGNOSIS — G500 Trigeminal neuralgia: Secondary | ICD-10-CM

## 2016-06-26 DIAGNOSIS — R413 Other amnesia: Secondary | ICD-10-CM

## 2016-06-26 NOTE — Progress Notes (Signed)
PATIENT: Suzanne Russo DOB: [birth year]  REASON FOR VISIT: follow up- trigeminal neuralgia, occipital neuralgia, memory impairment, headaches HISTORY FROM: patient  HISTORY OF PRESENT ILLNESS: Suzanne Russo is an 82 year old female with a history of trigeminal and occipital neuralgia, Memory impairment and headaches. She returns today for follow-up. She continues to take gabapentin and carbamazepine reports that this controls her trigeminal and occipital neuralgia. She feels that her memory impairment has remained the same. She is able to complete all ADLs independently. She operates a Librarian, academic without difficulty. She lives at home with her husband. She is able to prepare meals without difficulty. Denies any trouble sleeping. Denies hallucinations or agitation. She reports that her headaches had gotten better but in the last week or so her headaches have slightly increased. She states that if she bends over and stands up she tends to get a slight headache. Denies any other neurological symptoms. He returns today for an evaluation.  HISTORY 12/22/15: Suzanne Russo is a 82 year old female with a history of trigeminal neuralgia, left occipital neuralgia, mild memory impairment and headaches. She returns today for an evaluation. The patient reports that as long she takes carbamazepine and gabapentin she does not have any discomfort associated with her trigeminal or occipital neuralgia. At the last visit she has suffered a fall and hit her head. She states that in the last 3-4 weeks her headaches have improved drastically. She did increase her gabapentin to 1 tablet in the morning and 2 tablets in the evening. She states that she cannot tolerate higher doses of gabapentin during the day due to drowsiness. The patient fell out of a rolling chair last week. Her daughter states that this was due to the chair. Fortunately the patient did not hit her head but she caught herself on her left hand. Since then the left  lateral portion of the hand has been swollen. She states the bruising has improved. She reports that it is very painful especially touching the lateral aspect of the hand. She reports that she is having a hard time gripping objects. The patient also reports that on Sunday she was sitting in her chair reading the paper and she felt as if she was going to a fall out of the chair but was able to she is unsure of what happened. She denies any weakness in the extremities. No slurred speech or facial droop noted. The patient states that she has not had any additional symptoms. Her daughter reports that she has been known to fall asleep sitting at the table. The patient is unsure if this happened or not. She states that since then she has felt fine. She returns today for an evaluation.  HISTORY 08/26/15: Suzanne Russo is an 82 year old right-handed white female with a history of trigeminal neuralgia and left occipital neuralgia. The patient has been on carbamazepine and low-dose gabapentin. She has not been able to tolerate more than the 100 mg dosing during the day of the gabapentin secondary to drowsiness. On 05/15/2015, she fell backwards and struck the floor, she did not sustain loss of consciousness. The patient was noted to have some headache for about a week after the fall, and she felt somewhat sleepy. The patient seemed to do better with her headaches, but within the last week the headaches have increased. The patient is having daily headaches at this time, the headaches are primarily in the occipital area, and are different from her occipital neuralgia and may also be in the temporal regions  and on the top of her head. The patient denies any pain in the neck or pain going up from the neck into the head. She has had occasional nausea, no vomiting. She feels somewhat "fuzzy headed" with the headaches. The patient is taking Tylenol for the headache without much benefit. The patient feels that her balance has somewhat  worsened recently. She has not had any further falls, she walks with a cane. The patient has a chronic "dropped head syndrome". This has not changed. The patient denies any numbness or weakness of the extremities, she denies any night sweats, weight loss, fevers or chills. She does report some dizziness with the headache. She has not had any visual changes. She comes to this office for an evaluation.   REVIEW OF SYSTEMS: Out of a complete 14 system review of symptoms, the patient complains only of the following symptoms, and all other reviewed systems are negative.  See history of present illness  ALLERGIES: Allergies  Allergen Reactions  . Sulfa Antibiotics Rash  . Tetracycline Rash    HOME MEDICATIONS: Outpatient Medications Prior to Visit  Medication Sig Dispense Refill  . Biotin 1000 MCG tablet Take 1,000 mg by mouth daily.    . Calcium Carbonate-Vitamin D (CALCIUM + D PO) Take by mouth daily.    . carbamazepine (CARBATROL) 300 MG 12 hr capsule Take 2 capsules (600 mg total) by mouth 2 (two) times daily. 360 capsule 3  . celecoxib (CELEBREX) 200 MG capsule Take 200 mg by mouth daily.  2  . Cyanocobalamin (B-12 PO) Take by mouth daily.    Marland Kitchen. gabapentin (NEURONTIN) 100 MG capsule Take 1 capsule in the morning and two capsules at bedtime 270 capsule 3  . hydrocortisone (ANUSOL-HC) 25 MG suppository Place 25 mg rectally as needed.    . hyoscyamine (LEVSIN, ANASPAZ) 0.125 MG tablet Take 0.125 mg by mouth as needed.    Marland Kitchen. levothyroxine (SYNTHROID, LEVOTHROID) 100 MCG tablet Take 100 mcg by mouth daily before breakfast.    . loratadine (CLARITIN) 10 MG tablet Take 10 mg by mouth daily.    . Magnesium-Potassium 250-100 MG TABS Take 1 tablet by mouth daily.    . meclizine (ANTIVERT) 25 MG tablet Take 25 mg by mouth every 6 (six) hours as needed.    . mometasone (NASONEX) 50 MCG/ACT nasal spray Place 2 sprays into the nose daily.    . Omega-3 Fatty Acids (FISH OIL) 1000 MG CAPS Take 1,000 mg by  mouth 2 (two) times daily.    Marland Kitchen. omeprazole (PRILOSEC) 20 MG capsule Take 20 mg by mouth daily.    . sucralfate (CARAFATE) 1 GM/10ML suspension Take 1 g by mouth 4 (four) times daily as needed.    . traMADol (ULTRAM) 50 MG tablet Take 1 tablet (50 mg total) by mouth every 6 (six) hours as needed. 30 tablet 1  . vitamin E 400 UNIT capsule Take 400 Units by mouth daily.    . zoledronic acid (RECLAST) 5 MG/100ML SOLN injection Inject 5 mg into the vein once.     No facility-administered medications prior to visit.     PAST MEDICAL HISTORY: Past Medical History:  Diagnosis Date  . Carpal tunnel syndrome   . Concussion with no loss of consciousness 08/26/2015  . Degenerative arthritis   . DVT (deep venous thrombosis) (HCC)   . GERD (gastroesophageal reflux disease)   . Hemorrhoids   . Hypothyroidism   . Memory difficulty 07/16/2014  . Occipital neuralgia   .  Occipital neuralgia of left side 12/15/2014  . Osteoporosis   . Pelvic fracture (HCC)   . Peptic ulcer disease   . Right clavicle fracture   . Trigeminal neuralgia   . Venous insufficiency     PAST SURGICAL HISTORY: Past Surgical History:  Procedure Laterality Date  . bilateral cataract surgery    . bilateral knee surgery    . left hip fracture    . left thumb surgery    . left toe surgery    . NASAL SINUS SURGERY    . TONSILLECTOMY    . VAGINAL HYSTERECTOMY      FAMILY HISTORY: Family History  Problem Relation Age of Onset  . Heart disease Mother   . COPD Father   . Congestive Heart Failure Brother     SOCIAL HISTORY: Social History   Social History  . Marital status: Married    Spouse name: N/A  . Number of children: 3  . Years of education: college   Occupational History  . Retired    Social History Main Topics  . Smoking status: Former Smoker    Packs/day: 1.00    Years: 45.00    Types: Cigarettes  . Smokeless tobacco: Never Used  . Alcohol use 4.2 oz/week    7 Glasses of wine per week      Comment: wine on occasion  . Drug use: No  . Sexual activity: Not on file   Other Topics Concern  . Not on file   Social History Narrative   Patient is right handed.   Patient drinks 3 cups caffeine daily         PHYSICAL EXAM  Vitals:   06/26/16 1314  BP: 129/73  Pulse: 61  Weight: 121 lb 3.2 oz (55 kg)  Height: 5\' 4"  (1.626 m)   Body mass index is 20.8 kg/m.   MMSE - Mini Mental State Exam 06/26/2016 12/22/2015 06/22/2015  Orientation to time 4 5 5   Orientation to Place 5 5 5   Registration 3 3 3   Attention/ Calculation 1 0 5  Recall 2 1 2   Language- name 2 objects 2 2 2   Language- repeat 1 1 1   Language- follow 3 step command 3 3 3   Language- read & follow direction 1 1 1   Write a sentence 1 1 1   Copy design 1 1 0  Total score 24 23 28      Generalized: Well developed, in no acute distress   Neurological examination  Mentation: Alert oriented to time, place, history taking. Follows all commands speech and language fluent Cranial nerve II-XII: Pupils were equal round reactive to light. Extraocular movements were full, visual field were full on confrontational test. Facial sensation and strength were normal. Uvula tongue midline. Head turning and shoulder shrug  were normal and symmetric. Motor: The motor testing reveals 5 over 5 strength of all 4 extremities. Good symmetric motor tone is noted throughout.  Sensory: Sensory testing is intact to soft touch on all 4 extremities. No evidence of extinction is noted.  Coordination: Cerebellar testing reveals good finger-nose-finger and heel-to-shin bilaterally.  Gait and station: Gait is normal. Tandem gait is normal. Romberg is negative. No drift is seen.  Reflexes: Deep tendon reflexes are symmetric and normal bilaterally.   DIAGNOSTIC DATA (LABS, IMAGING, TESTING) - I reviewed patient records, labs, notes, testing and imaging myself where available.  Lab Results  Component Value Date   WBC 5.3 12/22/2015   HGB  10.4 (L) 07/16/2014  HCT 28.7 (L) 12/22/2015   MCV 89 12/22/2015   PLT 268 12/22/2015      Component Value Date/Time   NA 135 12/22/2015 1422   NA 137 12/26/2013 0554   K 5.1 12/22/2015 1422   K 3.5 12/26/2013 0554   CL 96 12/22/2015 1422   CL 104 12/26/2013 0554   CO2 28 12/22/2015 1422   CO2 29 12/26/2013 0554   GLUCOSE 89 12/22/2015 1422   GLUCOSE 82 12/26/2013 0554   BUN 14 12/22/2015 1422   BUN 12 12/26/2013 0554   CREATININE 0.65 12/22/2015 1422   CREATININE 0.68 12/30/2013 0637   CALCIUM 9.1 12/22/2015 1422   CALCIUM 8.0 (L) 12/26/2013 0554   PROT 6.5 12/22/2015 1422   PROT 6.4 09/03/2012 2208   ALBUMIN 4.2 12/22/2015 1422   ALBUMIN 3.5 09/03/2012 2208   AST 26 12/22/2015 1422   AST 29 09/03/2012 2208   ALT 23 12/22/2015 1422   ALT 21 09/03/2012 2208   ALKPHOS 53 12/22/2015 1422   ALKPHOS 65 09/03/2012 2208   BILITOT 0.4 12/22/2015 1422   BILITOT 0.2 09/03/2012 2208   GFRNONAA 84 12/22/2015 1422   GFRNONAA >60 12/30/2013 0637   GFRAA 96 12/22/2015 1422   GFRAA >60 12/30/2013 0637      ASSESSMENT AND PLAN 82 y.o. year old female  has a past medical history of Carpal tunnel syndrome; Concussion with no loss of consciousness (08/26/2015); Degenerative arthritis; DVT (deep venous thrombosis) (HCC); GERD (gastroesophageal reflux disease); Hemorrhoids; Hypothyroidism; Memory difficulty (07/16/2014); Occipital neuralgia; Occipital neuralgia of left side (12/15/2014); Osteoporosis; Pelvic fracture (HCC); Peptic ulcer disease; Right clavicle fracture; Trigeminal neuralgia; and Venous insufficiency. here with:  1. Trigeminal neuralgia 2. Occipital neuralgia 3. Memory disturbance  The patient's memory score has remained the same. She will continue on carbamazepine and gabapentin. I will check blood work today. Patient advised that if her symptoms worsen or she develops any new symptoms she should let us know. Patient also advised that if her headache frequency does not  improve she should make [place] aware. Her physical exam is relatively unremarkable. Will follow-up in 6 months with Dr. Anne Hahn.    Butch Penny, MSN, NP-C 06/26/2016, 1:06 PM [facility] [address], [address] [place], [place] [address] [phone]

## 2016-06-26 NOTE — Patient Instructions (Signed)
Continue gabapentin and carbamazepine for neuralgia Memory score is stable Check blood work today If your symptoms worsen or you develop new symptoms please let us know.

## 2016-06-27 LAB — COMPREHENSIVE METABOLIC PANEL
A/G RATIO: 2 (ref 1.2–2.2)
ALBUMIN: 4.5 g/dL (ref 3.5–4.7)
ALT: 24 IU/L (ref 0–32)
AST: 24 IU/L (ref 0–40)
Alkaline Phosphatase: 55 IU/L (ref 39–117)
BILIRUBIN TOTAL: 0.3 mg/dL (ref 0.0–1.2)
BUN / CREAT RATIO: 31 — AB (ref 12–28)
BUN: 20 mg/dL (ref 8–27)
CHLORIDE: 94 mmol/L — AB (ref 96–106)
CO2: 26 mmol/L (ref 18–29)
Calcium: 8.6 mg/dL — ABNORMAL LOW (ref 8.7–10.3)
Creatinine, Ser: 0.65 mg/dL (ref 0.57–1.00)
GFR calc Af Amer: 96 mL/min/{1.73_m2} (ref >59)
GFR calc non Af Amer: 83 mL/min/{1.73_m2} (ref >59)
GLOBULIN, TOTAL: 2.2 g/dL (ref 1.5–4.5)
Glucose: 91 mg/dL (ref 65–99)
POTASSIUM: 4.7 mmol/L (ref 3.5–5.2)
SODIUM: 136 mmol/L (ref 134–144)
Total Protein: 6.7 g/dL (ref 6.0–8.5)

## 2016-06-27 LAB — CBC WITH DIFFERENTIAL/PLATELET
BASOS ABS: 0.1 10*3/uL (ref 0.0–0.2)
Basos: 1 %
EOS (ABSOLUTE): 0.1 10*3/uL (ref 0.0–0.4)
EOS: 2 %
HEMATOCRIT: 29 % — AB (ref 34.0–46.6)
Hemoglobin: 9.9 g/dL — ABNORMAL LOW (ref 11.1–15.9)
Immature Grans (Abs): 0 10*3/uL (ref 0.0–0.1)
Immature Granulocytes: 0 %
LYMPHS ABS: 1.6 10*3/uL (ref 0.7–3.1)
Lymphs: 37 %
MCH: 30.2 pg (ref 26.6–33.0)
MCHC: 34.1 g/dL (ref 31.5–35.7)
MCV: 88 fL (ref 79–97)
Monocytes Absolute: 0.6 10*3/uL (ref 0.1–0.9)
Monocytes: 14 %
NEUTROS PCT: 46 %
Neutrophils Absolute: 1.9 10*3/uL (ref 1.4–7.0)
PLATELETS: 279 10*3/uL (ref 150–379)
RBC: 3.28 x10E6/uL — AB (ref 3.77–5.28)
RDW: 13.3 % (ref 12.3–15.4)
WBC: 4.2 10*3/uL (ref 3.4–10.8)

## 2016-06-27 LAB — CARBAMAZEPINE LEVEL, TOTAL: CARBAMAZEPINE LVL: 10.2 ug/mL (ref 4.0–12.0)

## 2016-06-28 ENCOUNTER — Telehealth: Payer: Self-pay | Admitting: *Deleted

## 2016-06-28 NOTE — Telephone Encounter (Signed)
LMVM for pt on her home 3 (ok per DPR) that labs unremarkable.  She is to call back if questions.

## 2016-06-28 NOTE — Telephone Encounter (Signed)
-----   Message from Butch PennyMegan Millikan, NP sent at 06/27/2016  1:38 PM EDT ----- Lab work unremarkable. Please call patient

## 2016-06-30 NOTE — Progress Notes (Signed)
I reviewed note and agree with plan.   Eustace Hur R. Elbridge Magowan, MD  Certified in Neurology, Neurophysiology and Neuroimaging  [facility] [address], [address] [place], [place] [address] [phone]   

## 2016-07-27 ENCOUNTER — Telehealth: Payer: Self-pay | Admitting: Adult Health

## 2016-07-27 NOTE — Telephone Encounter (Signed)
Called pt and she states that her headaches are getting worse and increased frequency.  Has had one since Tuesday. Location could be in forehead or in back of head,  Or all over her head.  Has taken advil with no help, took tramadol once (takes too long to take effect (3hours).  When stands up from bending over will have head pain, with dizziness. Do you want to give her something or have her come in.

## 2016-07-27 NOTE — Telephone Encounter (Signed)
Pt called in about her continued headaches. She was told to call in if it continues and gets more frequent and/or worse. [facility] [phone]

## 2016-07-27 NOTE — Telephone Encounter (Signed)
I called the patient. She continues to have a headache. Usually caused by bending over. It sometimes will resolve within half a day to a day. She also has dizziness. Please get the patient back in for an appointment.

## 2016-07-31 NOTE — Telephone Encounter (Signed)
Called pt and to leave a VM advising pt to call to schedule an appt this week with Aundra MilletMegan, per Megan's request.

## 2016-08-01 NOTE — Telephone Encounter (Signed)
Spoke to pt and made appt for tomorrow 08-01-16 at 1500.

## 2016-08-02 ENCOUNTER — Ambulatory Visit (INDEPENDENT_AMBULATORY_CARE_PROVIDER_SITE_OTHER): Payer: Medicare Other | Admitting: Adult Health

## 2016-08-02 ENCOUNTER — Encounter: Payer: Self-pay | Admitting: Adult Health

## 2016-08-02 VITALS — BP 136/64 | HR 68 | Resp 18 | Ht 64.0 in | Wt 119.5 lb

## 2016-08-02 DIAGNOSIS — G500 Trigeminal neuralgia: Secondary | ICD-10-CM | POA: Diagnosis not present

## 2016-08-02 DIAGNOSIS — R51 Headache: Secondary | ICD-10-CM

## 2016-08-02 DIAGNOSIS — G8929 Other chronic pain: Secondary | ICD-10-CM

## 2016-08-02 MED ORDER — GABAPENTIN 100 MG PO CAPS: ORAL_CAPSULE | ORAL | 3 refills | Status: DC

## 2016-08-02 NOTE — Patient Instructions (Addendum)
Increase gabapentin to 1 at 11:00, 5:00 and 2 before bedtime If headaches do not improve we will do an MRI of the brain. If your symptoms worsen or you develop new symptoms please let us know.

## 2016-08-02 NOTE — Progress Notes (Signed)
I have read the note, and I agree with the clinical assessment and plan.  WILLIS,CHARLES KEITH   

## 2016-08-02 NOTE — Progress Notes (Signed)
PATIENT: Suzanne Russo DOB: [birth year]  REASON FOR VISIT: follow up HISTORY FROM: patient  HISTORY OF PRESENT ILLNESS: Suzanne Russo is a [age] year old female with a history of trigeminal and occipital neuralgia. She returns today for follow-up. She states that since September her headaches have been increasing in frequency. She states that she fell last December and suffered a concussion at that time a CT was completed and unremarkable. Her headaches improved by February. She states that she does go several days with no headache and then may have several days with a headache. She states on Friday she started having facial pain on the right. She notices it when she brushes her teeth and sometimes with eating. She continues on carbamazepine and gabapentin. She also notices that her headaches tend to start when she bends over. 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 She states her headaches are typically a 10 out of 10 on the pain scale. She denies  photophobia, phonophobia and nausea and vomiting. Her headaches are typically located across the forehead. She returns today for an evaluation.  HISTORY 09/14/15: Suzanne Russo is an 82 year old female with a history of trigeminal and occipital neuralgia, Memory impairment and headaches. She returns today for follow-up. She continues to take gabapentin and carbamazepine reports that this controls her trigeminal and occipital neuralgia. She feels that her memory impairment has remained the same. She is able to complete all ADLs independently. She operates a Librarian, academicmotor vehicle without difficulty. She lives at home with her husband. She is able to prepare meals without difficulty. Denies any trouble sleeping. Denies hallucinations or agitation. She reports that her headaches had gotten better but in the last week or so her headaches have slightly increased. She states that if she bends over and stands up she tends to get a slight headache. Denies any other neurological symptoms.  He returns today for an evaluation.  HISTORY 12/22/15: Suzanne Russo is a 82 year old female with a history of trigeminal neuralgia, left occipital neuralgia, mild memory impairment and headaches. She returns today for an evaluation. The patient reports that as long she takes carbamazepine and gabapentin she does not have any discomfort associated with her trigeminal or occipital neuralgia. At the last visit she has suffered a fall and hit her head. She states that in the last 3-4 weeks her headaches have improved drastically. She did increase her gabapentin to 1 tablet in the morning and 2 tablets in the evening. She states that she cannot tolerate higher doses of gabapentin during the day due to drowsiness. The patient fell out of a rolling chair last week. Her daughter states that this was due to the chair. Fortunately the patient did not hit her head but she caught herself on her left hand. Since then the left lateral portion of the hand has been swollen. She states the bruising has improved. She reports that it is very painful especially touching the lateral aspect of the hand. She reports that she is having a hard time gripping objects. The patient also reports that on Sunday she was sitting in her chair reading the paper and she felt as if she was going to a fall out of the chair but was able to she is unsure of what happened. She denies any weakness in the extremities. No slurred speech or facial droop noted. The patient states that she has not had any additional symptoms. Her daughter reports that she has been known to fall asleep sitting at the table. The patient is  unsure if this happened or not. She states that since then she has felt fine. She returns today for an evaluation.  HISTORY 08/26/15: Suzanne Russo is an 82 year old right-handed white female with a history of trigeminal neuralgia and left occipital neuralgia. The patient has been on carbamazepine and low-dose gabapentin. She has not been able to  tolerate more than the 100 mg dosing during the day of the gabapentin secondary to drowsiness. On 05/15/2015, she fell backwards and struck the floor, she did not sustain loss of consciousness. The patient was noted to have some headache for about a week after the fall, and she felt somewhat sleepy. The patient seemed to do better with her headaches, but within the last week the headaches have increased. The patient is having daily headaches at this time, the headaches are primarily in the occipital area, and are different from her occipital neuralgia and may also be in the temporal regions and on the top of h er head. The patient denies any pain in the neck or pain going up from the neck into the head. She has had occasional nausea, no vomiting. She feels somewhat "fuzzy headed" with the headaches. The patient is taking Tylenol for the headache without much benefit. The patient feels that her balance has somewhat worsened recently. She has not had any further falls, she walks with a cane. The patient has a chronic "dropped head syndrome". This has not changed. The patient denies any numbness or weakness of the extremities, she denies any night sweats, weight loss, fevers or chills. She does report some dizziness with the headache. She has not had any visual changes. She comes to this office for an evaluation.  REVIEW OF SYSTEMS: Out of a complete 14 system review of symptoms, the patient complains only of the following symptoms, and all other reviewed systems are negative.  Headache, cold intolerance, heat intolerance  ALLERGIES: Allergies  Allergen Reactions  . Sulfa Antibiotics Rash  . Tetracycline Rash    HOME MEDICATIONS: Outpatient Medications Prior to Visit  Medication Sig Dispense Refill  . Biotin 1000 MCG tablet Take 1,000 mg by mouth daily.    . Calcium Carbonate-Vitamin D (CALCIUM + D PO) Take by mouth daily.    . carbamazepine (CARBATROL) 300 MG 12 hr capsule Take 2 capsules (600 mg  total) by mouth 2 (two) times daily. 360 capsule 3  . celecoxib (CELEBREX) 200 MG capsule Take 200 mg by mouth daily.  2  . Cyanocobalamin (B-12 PO) Take by mouth daily.    Marland Kitchen gabapentin (NEURONTIN) 100 MG capsule Take 1 capsule in the morning and two capsules at bedtime 270 capsule 3  . hydrocortisone (ANUSOL-HC) 25 MG suppository Place 25 mg rectally as needed.    Marland Kitchen levothyroxine (SYNTHROID, LEVOTHROID) 100 MCG tablet Take 100 mcg by mouth daily before breakfast.    . Magnesium-Potassium 250-100 MG TABS Take 1 tablet by mouth daily.    . meclizine (ANTIVERT) 25 MG tablet Take 25 mg by mouth every 6 (six) hours as needed.    . Omega-3 Fatty Acids (FISH OIL) 1000 MG CAPS Take 1,000 mg by mouth 2 (two) times daily.    Marland Kitchen omeprazole (PRILOSEC) 20 MG capsule Take 20 mg by mouth daily.    . sucralfate (CARAFATE) 1 GM/10ML suspension Take 1 g by mouth 4 (four) times daily as needed.    . traMADol (ULTRAM) 50 MG tablet Take 1 tablet (50 mg total) by mouth every 6 (six) hours as needed. 30 tablet  1  . vitamin E 400 UNIT capsule Take 400 Units by mouth daily.    . hyoscyamine (LEVSIN, ANASPAZ) 0.125 MG tablet Take 0.125 mg by mouth as needed.    . loratadine (CLARITIN) 10 MG tablet Take 10 mg by mouth daily.    . mometasone (NASONEX) 50 MCG/ACT nasal spray Place 2 sprays into the nose daily.     No facility-administered medications prior to visit.     PAST MEDICAL HISTORY: Past Medical History:  Diagnosis Date  . Carpal tunnel syndrome   . Concussion with no loss of consciousness 08/26/2015  . Degenerative arthritis   . DVT (deep venous thrombosis) (HCC)   . GERD (gastroesophageal reflux disease)   . Hemorrhoids   . Hypothyroidism   . Memory difficulty 07/16/2014  . Occipital neuralgia   . Occipital neuralgia of left side 12/15/2014  . Osteoporosis   . Pelvic fracture (HCC)   . Peptic ulcer disease   . Right clavicle fracture   . Trigeminal neuralgia   . Venous insufficiency     PAST  SURGICAL HISTORY: Past Surgical History:  Procedure Laterality Date  . bilateral cataract surgery    . bilateral knee surgery    . left hip fracture    . left thumb surgery    . left toe surgery    . NASAL SINUS SURGERY    . TONSILLECTOMY    . VAGINAL HYSTERECTOMY      FAMILY HISTORY: Family History  Problem Relation Age of Onset  . Heart disease Mother   . COPD Father   . Congestive Heart Failure Brother     SOCIAL HISTORY: Social History   Social History  . Marital status: Married    Spouse name: N/A  . Number of children: 3  . Years of education: college   Occupational History  . Retired    Social History Main Topics  . Smoking status: Former Smoker    Packs/day: 1.00    Years: 45.00    Types: Cigarettes  . Smokeless tobacco: Never Used  . Alcohol use 4.2 oz/week    7 Glasses of wine per week     Comment: wine on occasion  . Drug use: No  . Sexual activity: Not on file   Other Topics Concern  . Not on file   Social History Narrative   Patient is right handed.   Patient drinks 3 cups caffeine daily         PHYSICAL EXAM  Vitals:   08/02/16 1510  BP: 136/64  Pulse: 68  Resp: 18  Weight: 119 lb 8 oz (54.2 kg)  Height: 5\' 4"  (1.626 m)   Body mass index is 20.51 kg/m.  Generalized: Well developed, in no acute distress   Neurological examination  Mentation: Alert oriented to time, place, history taking. Follows all commands speech and language fluent Cranial nerve II-XII: Pupils were equal round reactive to light. Extraocular movements were full, visual field were full on confrontational test. Facial sensation and strength were normal. Uvula tongue midline. Head turning and shoulder shrug  were normal and symmetric. Dropped head. Motor: The motor testing reveals 5 over 5 strength of all 4 extremities. Good symmetric motor tone is noted throughout.  Sensory: Sensory testing is intact to soft touch on all 4 extremities. No evidence of extinction  is noted.  Coordination: Cerebellar testing reveals good finger-nose-finger and heel-to-shin bilaterally.  Gait and station: Gait is normal.  Reflexes: Deep tendon reflexes are symmetric and normal bilaterally.  DIAGNOSTIC DATA (LABS, IMAGING, TESTING) - I reviewed patient records, labs, notes, testing and imaging myself where available.  Lab Results  Component Value Date   WBC 4.2 06/26/2016   HGB 10.4 (L) 07/16/2014   HCT 29.0 (L) 06/26/2016   MCV 88 06/26/2016   PLT 279 06/26/2016      Component Value Date/Time   NA 136 06/26/2016 1416   NA 137 12/26/2013 0554   K 4.7 06/26/2016 1416   K 3.5 12/26/2013 0554   CL 94 (L) 06/26/2016 1416   CL 104 12/26/2013 0554   CO2 26 06/26/2016 1416   CO2 29 12/26/2013 0554   GLUCOSE 91 06/26/2016 1416   GLUCOSE 82 12/26/2013 0554   BUN 20 06/26/2016 1416   BUN 12 12/26/2013 0554   CREATININE 0.65 06/26/2016 1416   CREATININE 0.68 12/30/2013 0637   CALCIUM 8.6 (L) 06/26/2016 1416   CALCIUM 8.0 (L) 12/26/2013 0554   PROT 6.7 06/26/2016 1416   PROT 6.4 09/03/2012 2208   ALBUMIN 4.5 06/26/2016 1416   ALBUMIN 3.5 09/03/2012 2208   AST 24 06/26/2016 1416   AST 29 09/03/2012 2208   ALT 24 06/26/2016 1416   ALT 21 09/03/2012 2208   ALKPHOS 55 06/26/2016 1416   ALKPHOS 65 09/03/2012 2208   BILITOT 0.3 06/26/2016 1416   BILITOT 0.2 09/03/2012 2208   GFRNONAA 83 06/26/2016 1416   GFRNONAA >60 12/30/2013 0637   GFRAA 96 06/26/2016 1416   GFRAA >60 12/30/2013 0637     ASSESSMENT AND PLAN 82 y.o. year old female  has a past medical history of Carpal tunnel syndrome; Concussion with no loss of consciousness (08/26/2015); Degenerative arthritis; DVT (deep venous thrombosis) (HCC); GERD (gastroesophageal reflux disease); Hemorrhoids; Hypothyroidism; Memory difficulty (07/16/2014); Occipital neuralgia; Occipital neuralgia of left side (12/15/2014); Osteoporosis; Pelvic fracture (HCC); Peptic ulcer disease; Right clavicle fracture; Trigeminal  neuralgia; and Venous insufficiency. here with:  1. Headache 2. Trigeminal neuralgia  Advice patient will increase gabapentin to 1 tablet at 11 AM and 5 PM and 2 tablets at bedtime. She will continue on her current dose of Carbatrol. If this is not beneficial for trigeminal neuralgia and headaches we may consider an MRI of the brain. Patient is amenable to this plan. She will call if her symptoms do not improve. Otherwise we will see her in 3 months or sooner if needed.     Butch Penny, MSN, NP-C 08/02/2016, 3:12 PM [facility] [address] [place], [place] [address] [phone]

## 2016-08-22 ENCOUNTER — Telehealth: Payer: Self-pay | Admitting: Adult Health

## 2016-08-22 DIAGNOSIS — R519 Headache, unspecified: Secondary | ICD-10-CM

## 2016-08-22 DIAGNOSIS — R51 Headache: Principal | ICD-10-CM

## 2016-08-22 NOTE — Telephone Encounter (Signed)
Pt called in still having headaches. She would like to proceed with the MRI. Please call and advise

## 2016-08-23 NOTE — Addendum Note (Signed)
Addended by: Enedina FinnerMILLIKAN, Victorine Mcnee P on: 08/23/2016 01:18 PM   Modules accepted: Orders

## 2016-08-23 NOTE — Telephone Encounter (Signed)
[facility].

## 2016-08-23 NOTE — Telephone Encounter (Signed)
Pts husband called in asking that MRI appt not be made the week of Thanksgiving.

## 2016-08-23 NOTE — Telephone Encounter (Addendum)
I will place order for MRI brain w/wo contrast. Please call the patient and verify that she has no metal in her body- no pacemaker.

## 2016-08-23 NOTE — Telephone Encounter (Signed)
IF patient calls back please ask her if she has a pacemaker or any metal in her body. The MRI has been order. Megan(NP) needs to know. [facility] LFt vm for patient.

## 2016-08-23 NOTE — Addendum Note (Signed)
Addended by: Enedina FinnerMILLIKAN, Haydn Hutsell P on: 08/23/2016 11:11 AM   Modules accepted: Orders

## 2016-08-23 NOTE — Telephone Encounter (Addendum)
Patient is continuing to have daily headaches. She has not been responding to medication. These are new symptoms for the patient. We will do an MRI with/without contrast to look for any abnormalities in the brain that could be contributing to her ongoing headache.

## 2016-08-23 NOTE — Telephone Encounter (Signed)
Patient returned Katrina's call, patient states she has no pacemaker or metal in her body.

## 2016-09-04 NOTE — Telephone Encounter (Signed)
Called daughter and patient. Sooner appt scheduled w/ Dr. Anne HahnWillis for f/u ongoing HAs/dizziness and to review MRI results.

## 2016-09-04 NOTE — Telephone Encounter (Signed)
Spoke to daughter of pt, Roddie McMarlene, (ok per DPR).  Pt was in [place] for Thanksgiving.  Had episodes of dizziness, headache, vomiting, weakness.  Had CT and MRI in hospital there (which daughter stated were normal). Has had headache till this Sunday.  Wanted us to know about the MRI.  Pt stable at this time, see Dr. Anne HahnWillis next.  Will let Irving [facility] in MRI know that MRI done and no need to do here.  Daughter has report and will bring with pt on appt.

## 2016-09-04 NOTE — Telephone Encounter (Signed)
Patient's daughter is calling stating the patient was seen over the weekend at the ER in [place] with headaches and dizziness. The hospital did an MRI on the patient so she needs to cancel the order for an MRI. She states the MRI was normal but please call to discuss.

## 2016-09-19 ENCOUNTER — Encounter: Payer: Self-pay | Admitting: Neurology

## 2016-09-19 ENCOUNTER — Ambulatory Visit (INDEPENDENT_AMBULATORY_CARE_PROVIDER_SITE_OTHER): Payer: Medicare Other | Admitting: Neurology

## 2016-09-19 VITALS — BP 144/70 | HR 59 | Ht 64.0 in | Wt 120.0 lb

## 2016-09-19 DIAGNOSIS — R51 Headache: Secondary | ICD-10-CM

## 2016-09-19 DIAGNOSIS — M5481 Occipital neuralgia: Secondary | ICD-10-CM

## 2016-09-19 DIAGNOSIS — G500 Trigeminal neuralgia: Secondary | ICD-10-CM

## 2016-09-19 DIAGNOSIS — R519 Headache, unspecified: Secondary | ICD-10-CM

## 2016-09-19 MED ORDER — GABAPENTIN 100 MG PO CAPS: 200.0000 mg | ORAL_CAPSULE | Freq: Three times a day (TID) | ORAL | 3 refills | Status: DC

## 2016-09-19 NOTE — Progress Notes (Signed)
Reason for visit: Dizziness, headache  Suzanne Russo is an 82 y.o. female  History of present illness:  Suzanne Russo is an 82 year old right-handed white female with a history of right V3 distribution trigeminal neuralgia, and left occipital neuralgia, cervical spondylosis with chronic neck pain, and onset of bifrontal headaches. The patient has had headaches for greater than one year. The headaches seemed to get better during the summer of 2017, but the headaches have worsened since that time. The patient is having headache days more than 50% of the time, the headaches may last 3 days at a time. The patient has dizziness that she described as a lightheaded sensation, not vertigo that at times is independent of the headache. She may have increased headache if she stoops over or bends over. The patient is on carbamazepine and gabapentin for her trigeminal neuralgia which has been helpful. This has not helped her frontal headaches however. The patient takes gabapentin 100 mg in the morning and midday, 200 mg in the evening. The patient has had episodes of some fogginess of vision in the right eye that began over the last month or 2, the last episode was 3 weeks ago. The patient had MRI evaluation of the brain that showed no acute changes, but moderate white matter disease was noted. The patient has a chronic lacunar infarct in the right cerebellum. The patient comes to this office for an evaluation.  Past Medical History:  Diagnosis Date  . Carpal tunnel syndrome   . Concussion with no loss of consciousness 08/26/2015  . Degenerative arthritis   . DVT (deep venous thrombosis) (HCC)   . GERD (gastroesophageal reflux disease)   . Hemorrhoids   . Hypothyroidism   . Memory difficulty 07/16/2014  . Occipital neuralgia   . Occipital neuralgia of left side 12/15/2014  . Osteoporosis   . Pelvic fracture (HCC)   . Peptic ulcer disease   . Right clavicle fracture   . Trigeminal neuralgia   . Venous  insufficiency     Past Surgical History:  Procedure Laterality Date  . bilateral cataract surgery    . bilateral knee surgery    . left hip fracture    . left thumb surgery    . left toe surgery    . NASAL SINUS SURGERY    . TONSILLECTOMY    . VAGINAL HYSTERECTOMY      Family History  Problem Relation Age of Onset  . Heart disease Mother   . COPD Father   . Congestive Heart Failure Brother     Social history:  reports that she has quit smoking. Her smoking use included Cigarettes. She has a 45.00 pack-year smoking history. She has never used smokeless tobacco. She reports that she drinks about 4.2 oz of alcohol per week . She reports that she does not use drugs.    Allergies  Allergen Reactions  . Sulfa Antibiotics Rash  . Tetracycline Rash    Medications:  Prior to Admission medications   Medication Sig Start Date End Date Taking? Authorizing Provider  Biotin 1000 MCG tablet Take 1,000 mg by mouth daily.   Yes Historical Provider, MD  Calcium Carbonate-Vitamin D (CALCIUM + D PO) Take by mouth daily.   Yes Historical Provider, MD  carbamazepine (CARBATROL) 300 MG 12 hr capsule Take 2 capsules (600 mg total) by mouth 2 (two) times daily. 12/22/15  Yes Butch Penny, NP  celecoxib (CELEBREX) 200 MG capsule Take 200 mg by mouth daily. 08/19/15  Yes Historical Provider, MD  Cyanocobalamin (B-12 PO) Take by mouth daily.   Yes Historical Provider, MD  gabapentin (NEURONTIN) 100 MG capsule Take 2 capsules (200 mg total) by mouth 3 (three) times daily. 09/19/16  Yes York Spanielharles K Kambryn Dapolito, MD  hydrocortisone (ANUSOL-HC) 25 MG suppository Place 25 mg rectally as needed.   Yes Historical Provider, MD  levothyroxine (SYNTHROID, LEVOTHROID) 100 MCG tablet Take 100 mcg by mouth daily before breakfast.   Yes Historical Provider, MD  loratadine-pseudoephedrine (CLARITIN-D 24-HOUR) 10-240 MG 24 hr tablet Take 1 tablet by mouth daily.   Yes Historical Provider, MD  Magnesium-Potassium 250-100 MG  TABS Take 1 tablet by mouth daily.   Yes Historical Provider, MD  meclizine (ANTIVERT) 25 MG tablet Take 25 mg by mouth every 6 (six) hours as needed.   Yes Historical Provider, MD  Omega-3 Fatty Acids (FISH OIL) 1000 MG CAPS Take 1,000 mg by mouth 2 (two) times daily.   Yes Historical Provider, MD  omeprazole (PRILOSEC) 20 MG capsule Take 20 mg by mouth daily.   Yes Historical Provider, MD  sucralfate (CARAFATE) 1 GM/10ML suspension Take 1 g by mouth 4 (four) times daily as needed. 06/17/14  Yes Historical Provider, MD  traMADol (ULTRAM) 50 MG tablet Take 1 tablet (50 mg total) by mouth every 6 (six) hours as needed. 08/26/15  Yes York Spanielharles K Malayla Granberry, MD  vitamin E 400 UNIT capsule Take 400 Units by mouth daily.   Yes Historical Provider, MD  hyoscyamine (LEVSIN, ANASPAZ) 0.125 MG tablet Take 0.125 mg by mouth as needed. 07/16/14 07/26/14  Historical Provider, MD    ROS:  Out of a complete 14 system review of symptoms, the patient complains only of the following symptoms, and all other reviewed systems are negative.  Hearing loss, ringing in the ears, runny nose, drooling Blurred vision Leg swelling Daytime sleepiness, acting out dreams Joint pain, swelling, muscle cramps, walking difficulty, neck pain, neck stiffness Dizziness, headache, tremors  Blood pressure (!) 144/70, pulse (!) 59, height 5\' 4"  (1.626 m), weight 120 lb (54.4 kg).  Physical Exam  General: The patient is alert and cooperative at the time of the examination.  Neuromuscular: The patient keeps the head in flexion, she is able to rotate the head only about 25 bilaterally.  Skin: 1+ edema at ankles bilaterally.   Neurologic Exam  Mental status: The patient is alert and oriented x 3 at the time of the examination. The patient has apparent normal recent and remote memory, with an apparently normal attention span and concentration ability.   Cranial nerves: Facial symmetry is present. Speech is normal, no aphasia or  dysarthria is noted. Extraocular movements are full. Visual fields are full.  Motor: The patient has good strength in all 4 extremities.  Sensory examination: Soft touch sensation is symmetric on the face, arms, and legs.  Coordination: The patient has good finger-nose-finger and heel-to-shin bilaterally.  Gait and station: The patient has a slightly wide-based, unsteady gait. The patient uses a cane for ambulation. Tandem gait was not attempted. Romberg is negative. No drift is seen.  Reflexes: Deep tendon reflexes are symmetric.   Assessment/Plan:  1. Chronic headache, bifrontal  2. Episodic dizziness  3. Chronic neck pain  4. Right trigeminal neuralgia  5. Left occipital neuralgia  The patient has a very complex history, the origin of the dizziness is not clear, but it could be related to the headache syndrome itself or to the cervical spondylosis. The patient is having increased headaches,  we will recheck a sedimentation rate today, particularly given the history of episodes of visual change involving the right eye. The patient will be increased on gabapentin taking 200 mg 3 times daily. The patient may need to contact her ophthalmologist regarding her vision changes. If the gabapentin is not tolerated, we may try low-dose Cymbalta. The patient will follow-up for her next scheduled revisit.  Marlan Palau. Keith Blessed Girdner MD 09/19/2016 12:51 PM  [facility] [address] [place], [place] [id number]  Phone [phone] Fax [phone]

## 2016-09-19 NOTE — Patient Instructions (Signed)
   We will go up on the gabapentin to 200 mg three times a day.

## 2016-09-20 LAB — SEDIMENTATION RATE: SED RATE: 2 mm/h (ref 0–40)

## 2016-10-11 ENCOUNTER — Other Ambulatory Visit: Payer: Self-pay | Admitting: *Deleted

## 2016-10-11 MED ORDER — GABAPENTIN 100 MG PO CAPS: 200.0000 mg | ORAL_CAPSULE | Freq: Three times a day (TID) | ORAL | 3 refills | Status: DC

## 2016-10-11 NOTE — Telephone Encounter (Signed)
Pt seen 09/21/16 by Dr. Anne HahnWillis.  He increased gabapentin to 200mg  po TID.  [facility] to [facility] was a no print. Requesting prescription.

## 2016-10-13 NOTE — Telephone Encounter (Signed)
Fax confirmation received, [facility] RN

## 2016-11-02 ENCOUNTER — Ambulatory Visit: Payer: Medicare Other | Admitting: Adult Health

## 2016-11-14 ENCOUNTER — Ambulatory Visit (INDEPENDENT_AMBULATORY_CARE_PROVIDER_SITE_OTHER): Payer: Medicare Other | Admitting: Vascular Surgery

## 2016-11-14 ENCOUNTER — Encounter (INDEPENDENT_AMBULATORY_CARE_PROVIDER_SITE_OTHER): Payer: Self-pay | Admitting: Vascular Surgery

## 2016-11-14 VITALS — BP 145/75 | HR 59 | Resp 16 | Wt 126.0 lb

## 2016-11-14 DIAGNOSIS — I890 Lymphedema, not elsewhere classified: Secondary | ICD-10-CM | POA: Diagnosis not present

## 2016-11-14 DIAGNOSIS — L03115 Cellulitis of right lower limb: Secondary | ICD-10-CM | POA: Diagnosis not present

## 2016-11-14 DIAGNOSIS — M7989 Other specified soft tissue disorders: Secondary | ICD-10-CM

## 2016-11-14 DIAGNOSIS — L03119 Cellulitis of unspecified part of limb: Secondary | ICD-10-CM | POA: Insufficient documentation

## 2016-11-14 DIAGNOSIS — R413 Other amnesia: Secondary | ICD-10-CM | POA: Diagnosis not present

## 2016-11-14 NOTE — Assessment & Plan Note (Signed)
The patient has not been using her compression stockings or her lymphedema pump. This is clear given the appearance of her legs. We have strongly recommended that she get back using the lymphedema pump regularly. Once she comes out of the [employer] boots, she will need to wear her compression stockings daily.

## 2016-11-14 NOTE — Assessment & Plan Note (Signed)
Prominent and much worse than previous visits a couple of years ago. Patient has not been wearing compression stockings or elevating her legs. Needs to resume doing this, but now we will have to put her in Un[employer] boots for several weeks to try to get the swelling under better control. A 3 layer wrap was placed today. This was done on both legs. This will need to be changed weekly and I will reassess her legs in about 3 weeks.

## 2016-11-14 NOTE — Progress Notes (Signed)
MRN : [id number]  Suzanne Russo is a 83 y.o. (December 04, 1931) female who presents with chief complaint of  Chief Complaint  Patient presents with  . Follow-up  .  History of Present Illness: Patient returns today in follow up of Worsening pain, swelling, and inflammation of her legs. Her primary care physician gave her a prescription for antibiotics but this has not significantly improved over the past week. Her right leg is more swollen than the left, but both are quite swollen. This has resulted in significant discomfort in her legs as well. The legs are tender to the touch and the skin is taut and discolored with reddish purplish discoloration. There was no clear inciting event or causative factor other than she stopped wearing her stockings several months ago and has rarely been using her lymphedema pump. She does not have fever or chills or signs of systemic infection.  Current Outpatient Prescriptions  Medication Sig Dispense Refill  . Biotin 1000 MCG tablet Take 1,000 mg by mouth daily.    . Calcium Carbonate-Vitamin D (CALCIUM + D PO) Take by mouth daily.    . carbamazepine (CARBATROL) 300 MG 12 hr capsule Take 2 capsules (600 mg total) by mouth 2 (two) times daily. 360 capsule 3  . celecoxib (CELEBREX) 200 MG capsule Take 200 mg by mouth daily.  2  . citalopram (CELEXA) 10 MG tablet Take by mouth.    . Cyanocobalamin (B-12 PO) Take by mouth daily.    Marland Kitchen gabapentin (NEURONTIN) 100 MG capsule Take 2 capsules (200 mg total) by mouth 3 (three) times daily. 540 capsule 3  . hydrocortisone (ANUSOL-HC) 25 MG suppository Place 25 mg rectally as needed.    Marland Kitchen levothyroxine (SYNTHROID, LEVOTHROID) 100 MCG tablet Take 100 mcg by mouth daily before breakfast.    . loratadine-pseudoephedrine (CLARITIN-D 24-HOUR) 10-240 MG 24 hr tablet Take 1 tablet by mouth daily.    . Magnesium-Potassium 250-100 MG TABS Take 1 tablet by mouth daily.    . meclizine (ANTIVERT) 25 MG tablet Take 25 mg by mouth every 6  (six) hours as needed.    . mometasone (NASONEX) 50 MCG/ACT nasal spray Place 2 sprays into the nose daily.    . Omega-3 Fatty Acids (FISH OIL) 1000 MG CAPS Take 1,000 mg by mouth 2 (two) times daily.    Marland Kitchen omeprazole (PRILOSEC) 20 MG capsule Take 20 mg by mouth daily.    . sucralfate (CARAFATE) 1 GM/10ML suspension Take 1 g by mouth 4 (four) times daily as needed.    . traMADol (ULTRAM) 50 MG tablet Take 1 tablet (50 mg total) by mouth every 6 (six) hours as needed. 30 tablet 1  . vitamin E 400 UNIT capsule Take 400 Units by mouth daily.    . hyoscyamine (LEVSIN, ANASPAZ) 0.125 MG tablet Take 0.125 mg by mouth as needed.     No current facility-administered medications for this visit.     Past Medical History:  Diagnosis Date  . Carpal tunnel syndrome   . Concussion with no loss of consciousness 08/26/2015  . Degenerative arthritis   . DVT (deep venous thrombosis) (HCC)   . GERD (gastroesophageal reflux disease)   . Hemorrhoids   . Hypothyroidism   . Memory difficulty 07/16/2014  . Occipital neuralgia   . Occipital neuralgia of left side 12/15/2014  . Osteoporosis   . Pelvic fracture (HCC)   . Peptic ulcer disease   . Right clavicle fracture   . Trigeminal neuralgia   .  Venous insufficiency     Past Surgical History:  Procedure Laterality Date  . bilateral cataract surgery    . bilateral knee surgery    . left hip fracture    . left thumb surgery    . left toe surgery    . NASAL SINUS SURGERY    . TONSILLECTOMY    . VAGINAL HYSTERECTOMY      Social History Social History  Substance Use Topics  . Smoking status: Former Smoker    Packs/day: 1.00    Years: 45.00    Types: Cigarettes  . Smokeless tobacco: Never Used  . Alcohol use 4.2 oz/week    7 Glasses of wine per week     Comment: wine on occasion  Married, husband accompanies today  Family History Family History  Problem Relation Age of Onset  . Heart disease Mother   . COPD Father   . Congestive Heart  Failure Brother   No bleeding or clotting disorders  Allergies  Allergen Reactions  . Sulfa Antibiotics Rash  . Tetracycline Rash     REVIEW OF SYSTEMS (Negative unless checked)  Constitutional: [] Weight loss  [] Fever  [] Chills Cardiac: [] Chest pain   [] Chest pressure   [] Palpitations   [] Shortness of breath when laying flat   [] Shortness of breath at rest   [] Shortness of breath with exertion. Vascular:  [] Pain in legs with walking   [x] Pain in legs at rest   [] Pain in legs when laying flat   [] Claudication   [] Pain in feet when walking  [] Pain in feet at rest  [] Pain in feet when laying flat   [] History of DVT   [] Phlebitis   [x] Swelling in legs   [x] Varicose veins   [] Non-healing ulcers Pulmonary:   [] Uses home oxygen   [] Productive cough   [] Hemoptysis   [] Wheeze  [] COPD   [] Asthma Neurologic:  [] Dizziness  [] Blackouts   [] Seizures   [] History of stroke   [] History of TIA  [] Aphasia   [] Temporary blindness   [] Dysphagia   [] Weakness or numbness in arms   [] Weakness or numbness in legs Musculoskeletal:  [x] Arthritis   [] Joint swelling   [] Joint pain   [] Low back pain Hematologic:  [] Easy bruising  [] Easy bleeding   [] Hypercoagulable state   [] Anemic   Gastrointestinal:  [] Blood in stool   [] Vomiting blood  [] Gastroesophageal reflux/heartburn   [] Abdominal pain Genitourinary:  [] Chronic kidney disease   [] Difficult urination  [] Frequent urination  [] Burning with urination   [] Hematuria Skin:  [x] Rashes   [] Ulcers   [] Wounds Psychological:  [] History of anxiety   []  History of major depression.  Physical Examination  BP (!) 145/75   Pulse (!) 59   Resp 16   Wt 126 lb (57.2 kg)   BMI 21.63 kg/m  Gen: NAD. Thin. Appears younger than stated age Head: [place]/AT, No temporalis wasting. Ear/Nose/Throat: Hearing grossly intact, nares w/o erythema or drainage, trachea midline Eyes: Conjunctiva clear. Sclera non-icteric Neck: Supple.  No JVD.  Pulmonary:  Good air movement, no use of  accessory muscles.  Cardiac: RRR, normal S1, S2 Vascular:  Vessel Right Left  Radial Palpable Palpable  Ulnar Palpable Palpable  Brachial Palpable Palpable  Carotid Palpable, without bruit Palpable, without bruit  Aorta Not palpable N/A  Femoral Palpable Palpable  Popliteal Palpable Palpable  PT Not Palpable Trace Palpable  DP Palpable Palpable   Gastrointestinal: soft, non-tender/non-distended. No guarding/reflex.  Musculoskeletal: M/S 5/5 throughout.  No deformity or atrophy. 3+ right lower extremity edema and  1-2+ left lower extremity edema. Significant stasis dermatitis is present bilaterally. Neurologic: Sensation grossly intact in extremities.  Symmetrical.  Speech is fluent.  Psychiatric: Judgment intact, Mood & affect appropriate for pt's clinical situation. Dermatologic: No rashes or ulcers noted.  No cellulitis or open wounds. Lymph : No Cervical, Axillary, or Inguinal lymphadenopathy.      Labs Recent Results (from the past 2160 hour(s))  Sedimentation rate     Status: None   Collection Time: 09/19/16  1:24 PM  Result Value Ref Range   Sed Rate 2 0 - 40 mm/hr    Radiology No results found.    Assessment/Plan  Cellulitis of leg, right Recently completed a course of antibiotics prescribed by her primary care physician with minimal improvement. Leg remains quite swollen and red. I think a large portion of this stasis dermatitis and not infection, but certainly some degree of cellulitis may be present as well.  Swelling of limb Prominent and much worse than previous visits a couple of years ago. Patient has not been wearing compression stockings or elevating her legs. Needs to resume doing this, but now we will have to put her in Unna boots for several weeks to try to get the swelling under better control. A 3 layer wrap was placed today. This was done on both legs. This will need to be changed weekly and I will reassess her legs in about 3 weeks.  Lymphedema The  patient has not been using her compression stockings or her lymphedema pump. This is clear given the appearance of her legs. We have strongly recommended that she get back using the lymphedema pump regularly. Once she comes out of the [employer] boots, she will need to wear her compression stockings daily.    Festus BarrenJason Dew, MD  11/14/2016 1:51 PM    This note was created with Dragon medical transcription system.  Any errors from dictation are purely unintentional

## 2016-11-14 NOTE — Assessment & Plan Note (Signed)
Recently completed a course of antibiotics prescribed by her primary care physician with minimal improvement. Leg remains quite swollen and red. I think a large portion of this stasis dermatitis and not infection, but certainly some degree of cellulitis may be present as well.

## 2016-11-21 ENCOUNTER — Encounter (INDEPENDENT_AMBULATORY_CARE_PROVIDER_SITE_OTHER): Payer: Self-pay

## 2016-11-21 ENCOUNTER — Ambulatory Visit (INDEPENDENT_AMBULATORY_CARE_PROVIDER_SITE_OTHER): Payer: Medicare Other | Admitting: Vascular Surgery

## 2016-11-21 VITALS — BP 122/66 | HR 59 | Resp 16 | Wt 122.0 lb

## 2016-11-21 DIAGNOSIS — M7989 Other specified soft tissue disorders: Secondary | ICD-10-CM | POA: Diagnosis not present

## 2016-11-21 NOTE — Progress Notes (Signed)
History of Present Illness  There is no documented history at this time  Assessments & Plan   There are no diagnoses linked to this encounter.    Additional instructions  Subjective:  Patient presents with venous ulcer of Bilateral lower extremities.    Procedure:  3 layer unna wrap was placed on Bilateral lower extremities.   Plan:   Follow up in one week. 

## 2016-11-28 ENCOUNTER — Ambulatory Visit (INDEPENDENT_AMBULATORY_CARE_PROVIDER_SITE_OTHER): Payer: Medicare Other | Admitting: Vascular Surgery

## 2016-11-28 ENCOUNTER — Encounter (INDEPENDENT_AMBULATORY_CARE_PROVIDER_SITE_OTHER): Payer: Self-pay | Admitting: Vascular Surgery

## 2016-11-28 VITALS — BP 126/64 | HR 60 | Resp 16 | Ht 66.0 in | Wt 120.0 lb

## 2016-11-28 DIAGNOSIS — I890 Lymphedema, not elsewhere classified: Secondary | ICD-10-CM

## 2016-11-28 DIAGNOSIS — L03115 Cellulitis of right lower limb: Secondary | ICD-10-CM

## 2016-11-28 DIAGNOSIS — M7989 Other specified soft tissue disorders: Secondary | ICD-10-CM | POA: Diagnosis not present

## 2016-11-28 NOTE — Progress Notes (Signed)
History of Present Illness  There is no documented history at this time  Assessments & Plan   There are no diagnoses linked to this encounter.    Additional instructions  Subjective:  Patient presents with venous ulcer of the Bilateral lower extremity.    Procedure:  3 layer unna wrap was placed Bilateral lower extremity.   Plan:   Follow up in one week.  

## 2016-12-05 ENCOUNTER — Encounter (INDEPENDENT_AMBULATORY_CARE_PROVIDER_SITE_OTHER): Payer: Medicare Other

## 2016-12-05 ENCOUNTER — Ambulatory Visit (INDEPENDENT_AMBULATORY_CARE_PROVIDER_SITE_OTHER): Payer: Medicare Other | Admitting: Vascular Surgery

## 2016-12-05 ENCOUNTER — Encounter (INDEPENDENT_AMBULATORY_CARE_PROVIDER_SITE_OTHER): Payer: Self-pay | Admitting: Vascular Surgery

## 2016-12-05 VITALS — BP 120/68 | HR 71 | Resp 16 | Ht 64.0 in | Wt 122.0 lb

## 2016-12-05 DIAGNOSIS — M7989 Other specified soft tissue disorders: Secondary | ICD-10-CM | POA: Diagnosis not present

## 2016-12-05 DIAGNOSIS — I890 Lymphedema, not elsewhere classified: Secondary | ICD-10-CM | POA: Diagnosis not present

## 2016-12-05 DIAGNOSIS — L03115 Cellulitis of right lower limb: Secondary | ICD-10-CM

## 2016-12-06 NOTE — Progress Notes (Signed)
MRN : [id number]  Suzanne Russo is a 83 y.o. (1931-11-25) female who presents with chief complaint of  Chief Complaint  Patient presents with  . Leg Swelling    Unna boot check  .  History of Present Illness: Patient returns today in follow up of her leg swelling.  It is much better after three weeks in [employer].  She has no fever or chills.  She wants to come out of the [employer] today         Current Outpatient Prescriptions  Medication Sig Dispense Refill  . Biotin 1000 MCG tablet Take 1,000 mg by mouth daily.    . Calcium Carbonate-Vitamin D (CALCIUM + D PO) Take by mouth daily.    . carbamazepine (CARBATROL) 300 MG 12 hr capsule Take 2 capsules (600 mg total) by mouth 2 (two) times daily. 360 capsule 3  . celecoxib (CELEBREX) 200 MG capsule Take 200 mg by mouth daily.  2  . citalopram (CELEXA) 10 MG tablet Take by mouth.    . Cyanocobalamin (B-12 PO) Take by mouth daily.    Marland Kitchen gabapentin (NEURONTIN) 100 MG capsule Take 2 capsules (200 mg total) by mouth 3 (three) times daily. 540 capsule 3  . hydrocortisone (ANUSOL-HC) 25 MG suppository Place 25 mg rectally as needed.    Marland Kitchen levothyroxine (SYNTHROID, LEVOTHROID) 100 MCG tablet Take 100 mcg by mouth daily before breakfast.    . loratadine-pseudoephedrine (CLARITIN-D 24-HOUR) 10-240 MG 24 hr tablet Take 1 tablet by mouth daily.    . Magnesium-Potassium 250-100 MG TABS Take 1 tablet by mouth daily.    . meclizine (ANTIVERT) 25 MG tablet Take 25 mg by mouth every 6 (six) hours as needed.    . mometasone (NASONEX) 50 MCG/ACT nasal spray Place 2 sprays into the nose daily.    . Omega-3 Fatty Acids (FISH OIL) 1000 MG CAPS Take 1,000 mg by mouth 2 (two) times daily.    Marland Kitchen omeprazole (PRILOSEC) 20 MG capsule Take 20 mg by mouth daily.    . sucralfate (CARAFATE) 1 GM/10ML suspension Take 1 g by mouth 4 (four) times daily as needed.    . traMADol (ULTRAM) 50 MG tablet Take 1 tablet (50 mg total) by mouth every  6 (six) hours as needed. 30 tablet 1  . vitamin E 400 UNIT capsule Take 400 Units by mouth daily.    . hyoscyamine (LEVSIN, ANASPAZ) 0.125 MG tablet Take 0.125 mg by mouth as needed.     No current facility-administered medications for this visit.         Past Medical History:  Diagnosis Date  . Carpal tunnel syndrome   . Concussion with no loss of consciousness 08/26/2015  . Degenerative arthritis   . DVT (deep venous thrombosis) (HCC)   . GERD (gastroesophageal reflux disease)   . Hemorrhoids   . Hypothyroidism   . Memory difficulty 07/16/2014  . Occipital neuralgia   . Occipital neuralgia of left side 12/15/2014  . Osteoporosis   . Pelvic fracture (HCC)   . Peptic ulcer disease   . Right clavicle fracture   . Trigeminal neuralgia   . Venous insufficiency          Past Surgical History:  Procedure Laterality Date  . bilateral cataract surgery    . bilateral knee surgery    . left hip fracture    . left thumb surgery    . left toe surgery    . NASAL SINUS SURGERY    .  TONSILLECTOMY    . VAGINAL HYSTERECTOMY      Social History        Social History  Substance Use Topics  . Smoking status: Former Smoker    Packs/day: 1.00    Years: 45.00    Types: Cigarettes  . Smokeless tobacco: Never Used  . Alcohol use 4.2 oz/week     7 Glasses of wine per week      Comment: wine on occasion  Married, husband accompanies today  Family History      Family History  Problem Relation Age of Onset  . Heart disease Mother   . COPD Father   . Congestive Heart Failure Brother   No bleeding or clotting disorders      Allergies  Allergen Reactions  . Sulfa Antibiotics Rash  . Tetracycline Rash     REVIEW OF SYSTEMS (Negative unless checked)  Constitutional: [] Weight loss  [] Fever  [] Chills Cardiac: [] Chest pain   [] Chest pressure   [] Palpitations   [] Shortness of breath when laying flat   [] Shortness of breath  at rest   [] Shortness of breath with exertion. Vascular:  [] Pain in legs with walking   [x] Pain in legs at rest   [] Pain in legs when laying flat   [] Claudication   [] Pain in feet when walking  [] Pain in feet at rest  [] Pain in feet when laying flat   [] History of DVT   [] Phlebitis   [x] Swelling in legs   [x] Varicose veins   [] Non-healing ulcers Pulmonary:   [] Uses home oxygen   [] Productive cough   [] Hemoptysis   [] Wheeze  [] COPD   [] Asthma Neurologic:  [] Dizziness  [] Blackouts   [] Seizures   [] History of stroke   [] History of TIA  [] Aphasia   [] Temporary blindness   [] Dysphagia   [] Weakness or numbness in arms   [] Weakness or numbness in legs Musculoskeletal:  [x] Arthritis   [] Joint swelling   [] Joint pain   [] Low back pain Hematologic:  [] Easy bruising  [] Easy bleeding   [] Hypercoagulable state   [] Anemic   Gastrointestinal:  [] Blood in stool   [] Vomiting blood  [] Gastroesophageal reflux/heartburn   [] Abdominal pain Genitourinary:  [] Chronic kidney disease   [] Difficult urination  [] Frequent urination  [] Burning with urination   [] Hematuria Skin:  [x] Rashes   [] Ulcers   [] Wounds Psychological:  [] History of anxiety   []  History of major depression.  Physical Examination  BP (!) 145/75   Pulse (!) 59   Resp 16   Wt 126 lb (57.2 kg)   BMI 21.63 kg/m  Gen: NAD. Thin. Appears younger than stated age Head: [place]/AT, No temporalis wasting. Ear/Nose/Throat: Hearing grossly intact, nares w/o erythema or drainage, trachea midline Eyes: Conjunctiva clear. Sclera non-icteric Neck: Supple.  No JVD.  Pulmonary:  Good air movement, no use of accessory muscles.  Cardiac: RRR, normal S1, S2 Vascular:  Vessel Right Left  Radial Palpable Palpable  Ulnar Palpable Palpable  Brachial Palpable Palpable  Carotid Palpable, without bruit Palpable, without bruit  Aorta Not palpable N/A  Femoral Palpable Palpable  Popliteal Palpable Palpable  PT Not Palpable Trace Palpable  DP Palpable Palpable    Gastrointestinal: soft, non-tender/non-distended. No guarding/reflex.  Musculoskeletal: M/S 5/5 throughout.  No deformity or atrophy. 1-2+ right lower extremity edema and 1+ left lower extremity edema. Significant stasis dermatitis is present bilaterally. Neurologic: Sensation grossly intact in extremities.  Symmetrical.  Speech is fluent.  Psychiatric: Judgment intact, Mood & affect appropriate for pt's clinical situation. Dermatologic: No rashes or  ulcers noted.  No cellulitis or open wounds. Lymph : No Cervical, Axillary, or Inguinal lymphadenopathy.     Labs Recent Results (from the past 2160 hour(s))  Sedimentation rate     Status: None   Collection Time: 09/19/16  1:24 PM  Result Value Ref Range   Sed Rate 2 0 - 40 mm/hr    Radiology No results found.    Assessment/Plan  Cellulitis of leg, right Recently completed a course of antibiotics prescribed by her primary care physician with minimal improvement.. I think a large portion of this stasis dermatitis and not infection, but certainly some degree of cellulitis may have been present as well.This is basically resolved at this point  Swelling of limb Much better after 3 weeks in [employer] boots. Use compression stockings and elevation now  Lymphedema This is clear given the appearance of her legs. We have strongly recommended that she get back using the lymphedema pump regularly. No that she is out of the [employer] boots, she will need to wear her compression stockings daily.  RTC in three months.   No problem-specific Assessment & Plan notes found for this encounter.    Festus BarrenJason Andranik Jeune, MD  12/06/2016 7:46 AM    This note was created with Dragon medical transcription system.  Any errors from dictation are purely unintentional

## 2016-12-28 ENCOUNTER — Encounter: Payer: Self-pay | Admitting: Neurology

## 2016-12-28 ENCOUNTER — Ambulatory Visit (INDEPENDENT_AMBULATORY_CARE_PROVIDER_SITE_OTHER): Payer: Medicare Other | Admitting: Neurology

## 2016-12-28 VITALS — BP 140/67 | HR 64 | Ht 64.0 in | Wt 123.5 lb

## 2016-12-28 DIAGNOSIS — G500 Trigeminal neuralgia: Secondary | ICD-10-CM

## 2016-12-28 DIAGNOSIS — Z5181 Encounter for therapeutic drug level monitoring: Secondary | ICD-10-CM | POA: Diagnosis not present

## 2016-12-28 DIAGNOSIS — M5481 Occipital neuralgia: Secondary | ICD-10-CM | POA: Diagnosis not present

## 2016-12-28 DIAGNOSIS — R519 Headache, unspecified: Secondary | ICD-10-CM

## 2016-12-28 DIAGNOSIS — R413 Other amnesia: Secondary | ICD-10-CM | POA: Diagnosis not present

## 2016-12-28 DIAGNOSIS — R51 Headache: Secondary | ICD-10-CM | POA: Diagnosis not present

## 2016-12-28 MED ORDER — GABAPENTIN 100 MG PO CAPS: 200.0000 mg | ORAL_CAPSULE | Freq: Two times a day (BID) | ORAL | Status: DC

## 2016-12-28 NOTE — Progress Notes (Signed)
Reason for visit: Headache  Suzanne Russo is an 83 y.o. female  History of present illness:  Suzanne Russo is an 83 year old right-handed white female with a history of right V3 distribution trigeminal neuralgia and a left occipital neuralgia. The patient was having headaches when last seen in December 2017, she was placed on low-dose gabapentin, and she seemed to improve with this medication. The patient has been somewhat more staggery, however, but she has not sustained any falls. The patient uses a cane for ambulation. She has noted some swelling in the legs, she uses compression stockings for this. The patient otherwise is tolerating the medication well. She is on carbamazepine for her trigeminal neuralgia which has done better, she is only had 2 brief episodes of pain since last seen. The patient has recently bumped her head in the last couple weeks and got a "goose egg" on the top of her head when she hit a mantle. She returns for further evaluation.  Past Medical History:  Diagnosis Date  . Carpal tunnel syndrome   . Concussion with no loss of consciousness 08/26/2015  . Degenerative arthritis   . DVT (deep venous thrombosis) (HCC)   . GERD (gastroesophageal reflux disease)   . Hemorrhoids   . Hypothyroidism   . Memory difficulty 07/16/2014  . Occipital neuralgia   . Occipital neuralgia of left side 12/15/2014  . Osteoporosis   . Pelvic fracture (HCC)   . Peptic ulcer disease   . Right clavicle fracture   . Trigeminal neuralgia   . Venous insufficiency     Past Surgical History:  Procedure Laterality Date  . bilateral cataract surgery    . bilateral knee surgery    . left hip fracture    . left thumb surgery    . left toe surgery    . NASAL SINUS SURGERY    . TONSILLECTOMY    . VAGINAL HYSTERECTOMY      Family History  Problem Relation Age of Onset  . Heart disease Mother   . COPD Father   . Congestive Heart Failure Brother     Social history:  reports that she  has quit smoking. Her smoking use included Cigarettes. She has a 45.00 pack-year smoking history. She has never used smokeless tobacco. She reports that she drinks about 4.2 oz of alcohol per week . She reports that she does not use drugs.    Allergies  Allergen Reactions  . Sulfa Antibiotics Rash  . Tetracycline Rash    Medications:  Prior to Admission medications   Medication Sig Start Date End Date Taking? Authorizing Provider  Biotin 1000 MCG tablet Take 1,000 mg by mouth daily.   Yes Historical Provider, MD  Calcium Carbonate-Vitamin D (CALCIUM + D PO) Take by mouth daily.   Yes Historical Provider, MD  carbamazepine (CARBATROL) 300 MG 12 hr capsule Take 2 capsules (600 mg total) by mouth 2 (two) times daily. 12/22/15  Yes Butch Penny, NP  celecoxib (CELEBREX) 200 MG capsule Take 200 mg by mouth daily. 08/19/15  Yes Historical Provider, MD  Cyanocobalamin (B-12 PO) Take by mouth daily.   Yes Historical Provider, MD  gabapentin (NEURONTIN) 100 MG capsule Take 2 capsules (200 mg total) by mouth 2 (two) times daily. 12/28/16  Yes York Spaniel, MD  hydrocortisone (ANUSOL-HC) 25 MG suppository Place 25 mg rectally as needed.   Yes Historical Provider, MD  levothyroxine (SYNTHROID, LEVOTHROID) 100 MCG tablet Take 100 mcg by mouth daily before breakfast.  Yes Historical Provider, MD  loratadine-pseudoephedrine (CLARITIN-D 24-HOUR) 10-240 MG 24 hr tablet Take 1 tablet by mouth daily.   Yes Historical Provider, MD  Magnesium-Potassium 250-100 MG TABS Take 1 tablet by mouth daily.   Yes Historical Provider, MD  meclizine (ANTIVERT) 25 MG tablet Take 25 mg by mouth every 6 (six) hours as needed.   Yes Historical Provider, MD  mometasone (NASONEX) 50 MCG/ACT nasal spray Place 2 sprays into the nose daily.   Yes Historical Provider, MD  mometasone (NASONEX) 50 MCG/ACT nasal spray Place into the nose.   Yes Historical Provider, MD  Omega-3 Fatty Acids (FISH OIL) 1000 MG CAPS Take 1,000 mg by  mouth 2 (two) times daily.   Yes Historical Provider, MD  omeprazole (PRILOSEC) 20 MG capsule Take 20 mg by mouth daily.   Yes Historical Provider, MD  sucralfate (CARAFATE) 1 GM/10ML suspension Take 1 g by mouth 4 (four) times daily as needed. 06/17/14  Yes Historical Provider, MD  traMADol (ULTRAM) 50 MG tablet Take 1 tablet (50 mg total) by mouth every 6 (six) hours as needed. 08/26/15  Yes York Spanielharles K Isiaha Greenup, MD  vitamin E 100 UNIT capsule Take by mouth.   Yes Historical Provider, MD  vitamin E 400 UNIT capsule Take 400 Units by mouth daily.   Yes Historical Provider, MD  hyoscyamine (LEVSIN, ANASPAZ) 0.125 MG tablet Take 0.125 mg by mouth as needed. 07/16/14 07/26/14  Historical Provider, MD    ROS:  Out of a complete 14 system review of symptoms, the patient complains only of the following symptoms, and all other reviewed systems are negative.  Headache  Blood pressure 140/67, pulse 64, height 5\' 4"  (1.626 m), weight 123 lb 8 oz (56 kg).  Physical Exam  General: The patient is alert and cooperative at the time of the examination.  Skin: 1+ edema of ankles is noted bilaterally.   Neurologic Exam  Mental status: The patient is alert and oriented x 3 at the time of the examination. The patient has apparent normal recent and remote memory, with an apparently normal attention span and concentration ability.   Cranial nerves: Facial symmetry is present. Speech is normal, no aphasia or dysarthria is noted. Extraocular movements are full. Visual fields are full. The patient has a "dropped head".  Motor: The patient has good strength in all 4 extremities.  Sensory examination: Soft touch sensation is symmetric on the face, arms, and legs.  Coordination: The patient has good finger-nose-finger and heel-to-shin bilaterally.  Gait and station: The patient has a slightly wide-based gait. The patient usually uses a cane for ambulation. Tandem gait is unsteady. Romberg is negative. No drift is  seen.  Reflexes: Deep tendon reflexes are symmetric.   Assessment/Plan:  1. Right V3 distribution trigeminal neuralgia  2. Left occipital neuralgia  The patient is doing well with her headaches currently. The patient will be cut back on gabapentin taking 200 mg twice daily. We will check blood work to ensure that the carbamazepine has not gone too high as she is having some troubles with gait instability. The gait instability may be related to the addition of gabapentin. She will follow-up in 6 months.  Marlan Palau. Keith Arsenio Schnorr MD 12/28/2016 11:40 AM  [facility] [address] [place], [place] [id number]  Phone [phone] Fax [phone]

## 2016-12-29 ENCOUNTER — Telehealth: Payer: Self-pay | Admitting: Neurology

## 2016-12-29 LAB — CBC WITH DIFFERENTIAL/PLATELET
Basophils Absolute: 0 10*3/uL (ref 0.0–0.2)
Basos: 1 %
EOS (ABSOLUTE): 0.1 10*3/uL (ref 0.0–0.4)
EOS: 2 %
HEMATOCRIT: 29.5 % — AB (ref 34.0–46.6)
Hemoglobin: 9.5 g/dL — ABNORMAL LOW (ref 11.1–15.9)
IMMATURE GRANULOCYTES: 0 %
Immature Grans (Abs): 0 10*3/uL (ref 0.0–0.1)
LYMPHS ABS: 1.7 10*3/uL (ref 0.7–3.1)
Lymphs: 28 %
MCH: 28.6 pg (ref 26.6–33.0)
MCHC: 32.2 g/dL (ref 31.5–35.7)
MCV: 89 fL (ref 79–97)
MONOS ABS: 0.7 10*3/uL (ref 0.1–0.9)
Monocytes: 12 %
NEUTROS PCT: 57 %
Neutrophils Absolute: 3.4 10*3/uL (ref 1.4–7.0)
PLATELETS: 320 10*3/uL (ref 150–379)
RBC: 3.32 x10E6/uL — AB (ref 3.77–5.28)
RDW: 14.1 % (ref 12.3–15.4)
WBC: 6 10*3/uL (ref 3.4–10.8)

## 2016-12-29 LAB — COMPREHENSIVE METABOLIC PANEL
ALBUMIN: 4.2 g/dL (ref 3.5–4.7)
ALK PHOS: 71 IU/L (ref 39–117)
ALT: 17 IU/L (ref 0–32)
AST: 20 IU/L (ref 0–40)
Albumin/Globulin Ratio: 1.5 (ref 1.2–2.2)
BILIRUBIN TOTAL: 0.2 mg/dL (ref 0.0–1.2)
BUN / CREAT RATIO: 21 (ref 12–28)
BUN: 15 mg/dL (ref 8–27)
CO2: 25 mmol/L (ref 18–29)
CREATININE: 0.7 mg/dL (ref 0.57–1.00)
Calcium: 9.2 mg/dL (ref 8.7–10.3)
Chloride: 96 mmol/L (ref 96–106)
GFR calc Af Amer: 93 mL/min/{1.73_m2} (ref >59)
GFR calc non Af Amer: 81 mL/min/{1.73_m2} (ref >59)
GLOBULIN, TOTAL: 2.8 g/dL (ref 1.5–4.5)
GLUCOSE: 85 mg/dL (ref 65–99)
POTASSIUM: 4.8 mmol/L (ref 3.5–5.2)
SODIUM: 140 mmol/L (ref 134–144)
Total Protein: 7 g/dL (ref 6.0–8.5)

## 2016-12-29 LAB — CARBAMAZEPINE LEVEL, TOTAL: Carbamazepine (Tegretol), S: 12.4 ug/mL (ref 4.0–12.0)

## 2016-12-29 MED ORDER — CARBAMAZEPINE ER 200 MG PO CP12: 200.0000 mg | ORAL_CAPSULE | Freq: Two times a day (BID) | ORAL | 3 refills | Status: DC

## 2016-12-29 MED ORDER — CARBAMAZEPINE ER 300 MG PO CP12: 300.0000 mg | ORAL_CAPSULE | Freq: Two times a day (BID) | ORAL | Status: DC

## 2016-12-29 NOTE — Telephone Encounter (Signed)
I called patient. The blood work shows a mild chronic stable anemia, carbamazepine level is in the low toxic range, may be the reason for the gait instability.  We will cut back on the Carbatrol taking 500 mg twice daily instead of 600 mg twice daily, the patient will cut back on the 300 mg tablets taking one twice daily, I have called in a prescription for the 200 mg tablets taking one twice daily.

## 2017-01-01 ENCOUNTER — Telehealth: Payer: Self-pay | Admitting: Neurology

## 2017-01-01 NOTE — Telephone Encounter (Signed)
Suzanne Russo daughter called Friday in PM to ask some questions about the new seizure medication on behalf of her mother. Asked Dr Anne HahnWillis' or RN  to call her back today.  [phone]  CD

## 2017-01-01 NOTE — Telephone Encounter (Signed)
I called the patient. I went over the dosing change on carbamazepine with her, she is going from 600 mg twice a day of Carbatrol to 500 mg twice a day.

## 2017-01-02 ENCOUNTER — Telehealth (INDEPENDENT_AMBULATORY_CARE_PROVIDER_SITE_OTHER): Payer: Self-pay | Admitting: Vascular Surgery

## 2017-01-02 NOTE — Telephone Encounter (Signed)
Left leg, inside close to the ankle. It is scaly and brown-looking, wants to schedule an appointment. She states that she is no longer in the wraps. She is making an appointment

## 2017-01-02 NOTE — Telephone Encounter (Signed)
Pt has spot on left leg that looks like a sore and it hurts. Pt wish to speak to nurse concerning this matter. [phone]

## 2017-01-03 ENCOUNTER — Ambulatory Visit (INDEPENDENT_AMBULATORY_CARE_PROVIDER_SITE_OTHER): Payer: Medicare Other | Admitting: Vascular Surgery

## 2017-01-03 ENCOUNTER — Encounter (INDEPENDENT_AMBULATORY_CARE_PROVIDER_SITE_OTHER): Payer: Self-pay | Admitting: Vascular Surgery

## 2017-01-03 VITALS — BP 129/66 | HR 61 | Resp 16 | Wt 123.0 lb

## 2017-01-03 DIAGNOSIS — L03119 Cellulitis of unspecified part of limb: Secondary | ICD-10-CM | POA: Diagnosis not present

## 2017-01-03 DIAGNOSIS — M7989 Other specified soft tissue disorders: Secondary | ICD-10-CM | POA: Diagnosis not present

## 2017-01-03 DIAGNOSIS — L97321 Non-pressure chronic ulcer of left ankle limited to breakdown of skin: Secondary | ICD-10-CM | POA: Insufficient documentation

## 2017-01-03 NOTE — Progress Notes (Signed)
Subjective:    Patient ID: Suzanne Russo, female    DOB: [birth year], 83 y.o.   MRN: [id number] Chief Complaint  Patient presents with  . Ulcer    Follow up, recheck spot on leg   Patient presents with a chief complaint of lower extremity redness (bilaterally) and the beginning of a medial ankle ulcer on her left lower extremity. The patient has a remote history of laser ablation to her lower extremity by Dr. Earnestine LeysHearn. She is very compliant with wearing compression stockings, elevation and the use of the lymphedema pump. She denies any fever, nausea or vomiting.    Review of Systems  Constitutional: Negative.   HENT: Negative.   Eyes: Negative.   Respiratory: Negative.   Cardiovascular: Positive for leg swelling.  Gastrointestinal: Negative.   Endocrine: Negative.   Genitourinary: Negative.   Musculoskeletal: Negative.   Skin: Positive for wound.  Allergic/Immunologic: Negative.   Neurological: Negative.   Hematological: Negative.   Psychiatric/Behavioral: Negative.       Objective:   Physical Exam  Constitutional: She is oriented to person, place, and time. She appears well-developed and well-nourished. No distress.  HENT:  Head: Normocephalic and atraumatic.  Eyes: Conjunctivae are normal. Pupils are equal, round, and reactive to light.  Neck: Normal range of motion.  Cardiovascular: Normal rate, regular rhythm, normal heart sounds and intact distal pulses.   Pulses:      Radial pulses are 2+ on the right side, and 2+ on the left side.       Dorsalis pedis pulses are 2+ on the right side, and 2+ on the left side.       Posterior tibial pulses are 2+ on the right side, and 2+ on the left side.  Pulmonary/Chest: Effort normal.  Musculoskeletal: Normal range of motion. She exhibits edema (Mild Edema Bilaterally.).  Neurological: She is alert and oriented to person, place, and time.  Skin: She is not diaphoretic.  Mild Cellulitis of the bilateral lower extremity. Moderate  Stasis dermatitis noted bilaterally. Left Medial ankle with skin breakdown noted.   Psychiatric: She has a normal mood and affect. Her behavior is normal. Judgment and thought content normal.   BP 129/66   Pulse 61   Resp 16   Wt 123 lb (55.8 kg)   BMI 21.11 kg/m   Past Medical History:  Diagnosis Date  . Carpal tunnel syndrome   . Concussion with no loss of consciousness 08/26/2015  . Degenerative arthritis   . DVT (deep venous thrombosis) (HCC)   . GERD (gastroesophageal reflux disease)   . Hemorrhoids   . Hypothyroidism   . Memory difficulty 07/16/2014  . Occipital neuralgia   . Occipital neuralgia of left side 12/15/2014  . Osteoporosis   . Pelvic fracture (HCC)   . Peptic ulcer disease   . Right clavicle fracture   . Trigeminal neuralgia   . Venous insufficiency    Social History   Social History  . Marital status: Married    Spouse name: N/A  . Number of children: 3  . Years of education: college   Occupational History  . Retired    Social History Main Topics  . Smoking status: Former Smoker    Packs/day: 1.00    Years: 45.00    Types: Cigarettes  . Smokeless tobacco: Never Used  . Alcohol use 4.2 oz/week    7 Glasses of wine per week     Comment: wine on occasion  . Drug use: No  .  Sexual activity: Not on file   Other Topics Concern  . Not on file   Social History Narrative   Patient is right handed.   Patient drinks 3 cups caffeine daily      Past Surgical History:  Procedure Laterality Date  . bilateral cataract surgery    . bilateral knee surgery    . left hip fracture    . left thumb surgery    . left toe surgery    . NASAL SINUS SURGERY    . TONSILLECTOMY    . VAGINAL HYSTERECTOMY     Family History  Problem Relation Age of Onset  . Heart disease Mother   . COPD Father   . Congestive Heart Failure Brother    Allergies  Allergen Reactions  . Sulfa Antibiotics Rash  . Tetracycline Rash      Assessment & Plan:  Patient presents  with a chief complaint of lower extremity redness (bilaterally) and the beginning of a medial ankle ulcer on her left lower extremity. The patient has a remote history of laser ablation to her lower extremity by Dr. Earnestine Leys. She is very compliant with wearing compression stockings, elevation and the use of the lymphedema pump. She denies any fever, nausea or vomiting.   1. Swelling of limb - Worsening Patient with bilateral cellulitis.  Mild edema. Will treat with Keflex 500mg  q6 hours #40 Will check legs in one week when unna change to assure cellulitis has resolved.  Unna boots bilatearlly for four weeks.  Patient to continue to elevation and lymphedema pump.  Will order bilateral venous duplex to assess venous anatomy.   - VAS [place] LOWER EXTREMITY VENOUS REFLUX; Future  2. Ankle ulcer, left, limited to breakdown of skin [facility] - New Patient with bilateral cellulitis.  Mild edema. Skin breakdown noted on medial aspect of LLE ankle. Will treat with Keflex 500mg  q6 hours #40 Will check legs in one week when unna change to assure cellulitis has resolved.  Unna boots bilatearlly for four weeks.  Patient to continue to elevation and lymphedema pump.  3. Cellulitis of lower extremity, unspecified laterality - New Patient with bilateral cellulitis.  Mild edema. Skin breakdown noted on medial aspect of LLE ankle. Will treat with Keflex 500mg  q6 hours #40 Will check legs in one week when unna change to assure cellulitis has resolved.  Unna boots bilatearlly for four weeks.  Patient to continue to elevation and lymphedema pump.  Current Outpatient Prescriptions on File Prior to Visit  Medication Sig Dispense Refill  . Biotin 1000 MCG tablet Take 1,000 mg by mouth daily.    . Calcium Carbonate-Vitamin D (CALCIUM + D PO) Take by mouth daily.    . carbamazepine (CARBATROL) 200 MG 12 hr capsule Take 1 capsule (200 mg total) by mouth 2 (two) times daily. 180 capsule 3  . carbamazepine (CARBATROL)  300 MG 12 hr capsule Take 1 capsule (300 mg total) by mouth 2 (two) times daily.    . celecoxib (CELEBREX) 200 MG capsule Take 200 mg by mouth daily.  2  . Cyanocobalamin (B-12 PO) Take by mouth daily.    Marland Kitchen gabapentin (NEURONTIN) 100 MG capsule Take 2 capsules (200 mg total) by mouth 2 (two) times daily.    . hydrocortisone (ANUSOL-HC) 25 MG suppository Place 25 mg rectally as needed.    Marland Kitchen levothyroxine (SYNTHROID, LEVOTHROID) 100 MCG tablet Take 100 mcg by mouth daily before breakfast.    . loratadine-pseudoephedrine (CLARITIN-D 24-HOUR) 10-240 MG 24 hr tablet  Take 1 tablet by mouth daily.    . Magnesium-Potassium 250-100 MG TABS Take 1 tablet by mouth daily.    . meclizine (ANTIVERT) 25 MG tablet Take 25 mg by mouth every 6 (six) hours as needed.    . mometasone (NASONEX) 50 MCG/ACT nasal spray Place 2 sprays into the nose daily.    . mometasone (NASONEX) 50 MCG/ACT nasal spray Place into the nose.    . Omega-3 Fatty Acids (FISH OIL) 1000 MG CAPS Take 1,000 mg by mouth 2 (two) times daily.    Marland Kitchen omeprazole (PRILOSEC) 20 MG capsule Take 20 mg by mouth daily.    . sucralfate (CARAFATE) 1 GM/10ML suspension Take 1 g by mouth 4 (four) times daily as needed.    . traMADol (ULTRAM) 50 MG tablet Take 1 tablet (50 mg total) by mouth every 6 (six) hours as needed. 30 tablet 1  . vitamin E 100 UNIT capsule Take by mouth.    . vitamin E 400 UNIT capsule Take 400 Units by mouth daily.    . hyoscyamine (LEVSIN, ANASPAZ) 0.125 MG tablet Take 0.125 mg by mouth as needed.     No current facility-administered medications on file prior to visit.     There are no Patient Instructions on file for this visit. No Follow-up on file.   Ronella Plunk A Joyanne Eddinger, PA-C

## 2017-01-08 NOTE — Telephone Encounter (Signed)
Pt daughter is asking for a call, she has some questions re: blood work and a few other questions re: her mother

## 2017-01-09 ENCOUNTER — Ambulatory Visit (INDEPENDENT_AMBULATORY_CARE_PROVIDER_SITE_OTHER): Payer: Medicare Other | Admitting: Vascular Surgery

## 2017-01-09 ENCOUNTER — Encounter (INDEPENDENT_AMBULATORY_CARE_PROVIDER_SITE_OTHER): Payer: Self-pay | Admitting: Vascular Surgery

## 2017-01-09 VITALS — BP 128/59 | HR 68 | Resp 17 | Wt 119.0 lb

## 2017-01-09 DIAGNOSIS — I890 Lymphedema, not elsewhere classified: Secondary | ICD-10-CM

## 2017-01-09 MED ORDER — TRAMADOL HCL 50 MG PO TABS: 50.0000 mg | ORAL_TABLET | Freq: Four times a day (QID) | ORAL | 1 refills | Status: DC | PRN

## 2017-01-09 NOTE — Progress Notes (Signed)
History of Present Illness  There is no documented history at this time  Assessments & Plan   There are no diagnoses linked to this encounter.    Additional instructions  Subjective:  Patient presents with venous ulcer of the Bilateral lower extremity.    Procedure:  3 layer unna wrap was placed Bilateral lower extremity.   Plan:   Follow up in one week.  

## 2017-01-09 NOTE — Addendum Note (Signed)
Addended by: York Spaniel on: 01/09/2017 03:45 PM   Modules accepted: Orders

## 2017-01-09 NOTE — Telephone Encounter (Signed)
Faxed printed/signed rx tramadol to pt pharmacy. Fax: [phone]. Received confirmation.

## 2017-01-09 NOTE — Telephone Encounter (Signed)
I called the patient's daughter. The patient is being followed for memory problems, she does have a mild memory disturbance, she can go on a medication such as Aricept at a time, but the daughter does not think that the patient will want to do this. We will follow the memory over time.  If the trigeminal neuralgia pain worsens with the lower dose of carbamazepine, we will need to alter medication therapy.  I will call in a prescription for Ultram to have if needed for pain.

## 2017-01-09 NOTE — Telephone Encounter (Signed)
I spoke to daughter and she had a couple of questions....  She states that patient usually comes in about every 4 months, this time she was told to come back in 6. Daughter asks why the change?  Daughter states that there was not a memory screen completed at her last visit and they feel that the patient's memory has declined. Per daughter she exhibiting some abnormal behavior. With these changes she asks if patient needs to come in for additional memory testing? Or what should they do?  Also patient requests refill of Tramadol just incase her pain comes back while she decreases the Carbatrol.

## 2017-01-10 ENCOUNTER — Encounter (INDEPENDENT_AMBULATORY_CARE_PROVIDER_SITE_OTHER): Payer: Medicare Other

## 2017-01-17 ENCOUNTER — Encounter (INDEPENDENT_AMBULATORY_CARE_PROVIDER_SITE_OTHER): Payer: Self-pay

## 2017-01-17 ENCOUNTER — Encounter (INDEPENDENT_AMBULATORY_CARE_PROVIDER_SITE_OTHER): Payer: Medicare Other

## 2017-01-24 ENCOUNTER — Encounter (INDEPENDENT_AMBULATORY_CARE_PROVIDER_SITE_OTHER): Payer: Self-pay | Admitting: Vascular Surgery

## 2017-01-24 ENCOUNTER — Ambulatory Visit (INDEPENDENT_AMBULATORY_CARE_PROVIDER_SITE_OTHER): Payer: Medicare Other | Admitting: Vascular Surgery

## 2017-01-24 ENCOUNTER — Encounter (INDEPENDENT_AMBULATORY_CARE_PROVIDER_SITE_OTHER): Payer: Medicare Other

## 2017-01-24 VITALS — BP 129/68 | HR 60 | Resp 16 | Wt 118.8 lb

## 2017-01-24 DIAGNOSIS — L03119 Cellulitis of unspecified part of limb: Secondary | ICD-10-CM

## 2017-01-24 DIAGNOSIS — M7989 Other specified soft tissue disorders: Secondary | ICD-10-CM

## 2017-01-24 DIAGNOSIS — I890 Lymphedema, not elsewhere classified: Secondary | ICD-10-CM | POA: Diagnosis not present

## 2017-01-31 ENCOUNTER — Encounter (INDEPENDENT_AMBULATORY_CARE_PROVIDER_SITE_OTHER): Payer: Medicare Other | Admitting: Vascular Surgery

## 2017-02-09 ENCOUNTER — Encounter (INDEPENDENT_AMBULATORY_CARE_PROVIDER_SITE_OTHER): Payer: Self-pay

## 2017-02-09 ENCOUNTER — Ambulatory Visit (INDEPENDENT_AMBULATORY_CARE_PROVIDER_SITE_OTHER): Payer: Medicare Other | Admitting: Vascular Surgery

## 2017-03-06 ENCOUNTER — Ambulatory Visit (INDEPENDENT_AMBULATORY_CARE_PROVIDER_SITE_OTHER): Payer: Medicare Other | Admitting: Vascular Surgery

## 2017-03-14 ENCOUNTER — Encounter (INDEPENDENT_AMBULATORY_CARE_PROVIDER_SITE_OTHER): Payer: Self-pay | Admitting: Vascular Surgery

## 2017-03-14 ENCOUNTER — Ambulatory Visit (INDEPENDENT_AMBULATORY_CARE_PROVIDER_SITE_OTHER): Payer: Medicare Other | Admitting: Vascular Surgery

## 2017-03-14 ENCOUNTER — Ambulatory Visit (INDEPENDENT_AMBULATORY_CARE_PROVIDER_SITE_OTHER): Payer: Medicare Other

## 2017-03-14 VITALS — BP 149/77 | HR 66 | Resp 16 | Wt 120.4 lb

## 2017-03-14 DIAGNOSIS — I890 Lymphedema, not elsewhere classified: Secondary | ICD-10-CM

## 2017-03-14 DIAGNOSIS — I872 Venous insufficiency (chronic) (peripheral): Secondary | ICD-10-CM | POA: Diagnosis not present

## 2017-03-14 DIAGNOSIS — M7989 Other specified soft tissue disorders: Secondary | ICD-10-CM | POA: Diagnosis not present

## 2017-03-14 NOTE — Progress Notes (Signed)
Subjective:    Patient ID: Suzanne Russo, female    DOB: 15-Sep-1932, 83 y.o.   MRN: [id number] Chief Complaint  Patient presents with  . Follow-up   Patient last seen on 01/24/2017. She presents today to review vascular studies. Patient with recurrent cellulitis of the bilateral lower extremity. Patient states she does not wear her medical grade one compression stockings, engage in elevation or use her lymphedema pump on a daily basis. Seen with daughter. We discussed the importance of conservative therapy and the need to use her lymphedema pump on a daily basis for at least an hour to control her venous insufficiency. If the patient does not do these things she understands she will continue to experience cellulitis of the lower extremity. Today the patient underwent a bilateral lower extremity venous duplex exam which was notable for venous incompetence of the bilateral deep systems. The bilateral great saphenous and left small saphenous veins appear chronically occluded which is consistent with previous vein procedures. No evidence of deep vein thrombosis in the bilateral lower extremities. Patient denies any recent bouts of cellulitis. Patient denies any fever nausea or vomiting.   Review of Systems  Constitutional: Negative.   HENT: Negative.   Eyes: Negative.   Respiratory: Negative.   Cardiovascular: Positive for leg swelling.  Gastrointestinal: Negative.   Endocrine: Negative.   Genitourinary: Negative.   Musculoskeletal: Negative.   Skin: Negative.   Allergic/Immunologic: Negative.   Neurological: Negative.   Hematological: Negative.   Psychiatric/Behavioral: Negative.       Objective:   Physical Exam  Constitutional: She is oriented to person, place, and time. She appears well-developed and well-nourished. No distress.  HENT:  Head: Normocephalic and atraumatic.  Eyes: Conjunctivae are normal. Pupils are equal, round, and reactive to light.  Neck: Normal range of motion.    Cardiovascular: Normal rate, regular rhythm, normal heart sounds and intact distal pulses.   Pulses:      Radial pulses are 2+ on the right side, and 2+ on the left side.       Dorsalis pedis pulses are 2+ on the right side, and 2+ on the left side.       Posterior tibial pulses are 2+ on the right side, and 2+ on the left side.  Pulmonary/Chest: Effort normal.  Musculoskeletal: Normal range of motion. She exhibits edema (Mild edema bilaterally).  Neurological: She is alert and oriented to person, place, and time.  Skin: She is not diaphoretic.  Stasis dermatitis noted on bilateral lower extremity  Psychiatric: She has a normal mood and affect. Her behavior is normal. Judgment and thought content normal.  Vitals reviewed.   BP (!) 149/77   Pulse 66   Resp 16   Wt 120 lb 6.4 oz (54.6 kg)   BMI 20.67 kg/m   Past Medical History:  Diagnosis Date  . Carpal tunnel syndrome   . Concussion with no loss of consciousness 08/26/2015  . Degenerative arthritis   . DVT (deep venous thrombosis) (HCC)   . GERD (gastroesophageal reflux disease)   . Hemorrhoids   . Hypothyroidism   . Memory difficulty 07/16/2014  . Occipital neuralgia   . Occipital neuralgia of left side 12/15/2014  . Osteoporosis   . Pelvic fracture (HCC)   . Peptic ulcer disease   . Right clavicle fracture   . Trigeminal neuralgia   . Venous insufficiency     Social History   Social History  . Marital status: Married    Spouse  name: N/A  . Number of children: 3  . Years of education: college   Occupational History  . Retired    Social History Main Topics  . Smoking status: Former Smoker    Packs/day: 1.00    Years: 45.00    Types: Cigarettes  . Smokeless tobacco: Never Used  . Alcohol use 4.2 oz/week    7 Glasses of wine per week     Comment: wine on occasion  . Drug use: No  . Sexual activity: Not on file   Other Topics Concern  . Not on file   Social History Narrative   Patient is right handed.    Patient drinks 3 cups caffeine daily       Past Surgical History:  Procedure Laterality Date  . bilateral cataract surgery    . bilateral knee surgery    . left hip fracture    . left thumb surgery    . left toe surgery    . NASAL SINUS SURGERY    . TONSILLECTOMY    . VAGINAL HYSTERECTOMY      Family History  Problem Relation Age of Onset  . Heart disease Mother   . COPD Father   . Congestive Heart Failure Brother     Allergies  Allergen Reactions  . Citalopram Other (See Comments)    Caused bad dreams  . Sulfa Antibiotics Rash  . Tetracycline Rash       Assessment & Plan:  Patient last seen on 01/24/2017. She presents today to review vascular studies. Patient with recurrent cellulitis of the bilateral lower extremity. Patient states she does not wear her medical grade one compression stockings, engage in elevation or use her lymphedema pump on a daily basis. Seen with daughter. We discussed the importance of conservative therapy and the need to use her lymphedema pump on a daily basis for at least an hour to control her venous insufficiency. If the patient does not do these things she understands she will continue to experience cellulitis of the lower extremity. Today the patient underwent a bilateral lower extremity venous duplex exam which was notable for venous incompetence of the bilateral deep systems. The bilateral great saphenous and left small saphenous veins appear chronically occluded which is consistent with previous vein procedures. No evidence of deep vein thrombosis in the bilateral lower extremities. Patient denies any recent bouts of cellulitis. Patient denies any fever nausea or vomiting.  1. Chronic venous insufficiency - Stable Repeat venous duplex of bilateral deep venous reflux noted.  No superficial venous reflux noted. Had a long discussion about the importance of wearing medical grade one compression, elevating her legs on a daily basis, and using her  lymphedema pump at least twice a day for an hour each time. The patient and her daughter expressed her understanding. Patient to follow up when necessary  2. Lymphedema - stable As above  Current Outpatient Prescriptions on File Prior to Visit  Medication Sig Dispense Refill  . Biotin 1000 MCG tablet Take 1,000 mg by mouth daily.    . Calcium Carbonate-Vitamin D (CALCIUM + D PO) Take by mouth daily.    . carbamazepine (CARBATROL) 200 MG 12 hr capsule Take 1 capsule (200 mg total) by mouth 2 (two) times daily. 180 capsule 3  . carbamazepine (CARBATROL) 300 MG 12 hr capsule Take 1 capsule (300 mg total) by mouth 2 (two) times daily.    . celecoxib (CELEBREX) 200 MG capsule Take 200 mg by mouth daily.  2  .  Cyanocobalamin (B-12 PO) Take by mouth daily.    Marland Kitchen gabapentin (NEURONTIN) 100 MG capsule Take 2 capsules (200 mg total) by mouth 2 (two) times daily.    . hydrocortisone (ANUSOL-HC) 25 MG suppository Place 25 mg rectally as needed.    Marland Kitchen levothyroxine (SYNTHROID, LEVOTHROID) 100 MCG tablet Take 100 mcg by mouth daily before breakfast.    . loratadine-pseudoephedrine (CLARITIN-D 24-HOUR) 10-240 MG 24 hr tablet Take 1 tablet by mouth daily.    . Magnesium-Potassium 250-100 MG TABS Take 1 tablet by mouth daily.    . meclizine (ANTIVERT) 25 MG tablet Take 25 mg by mouth every 6 (six) hours as needed.    . mometasone (NASONEX) 50 MCG/ACT nasal spray Place 2 sprays into the nose daily.    . mometasone (NASONEX) 50 MCG/ACT nasal spray Place into the nose.    . Omega-3 Fatty Acids (FISH OIL) 1000 MG CAPS Take 1,000 mg by mouth 2 (two) times daily.    Marland Kitchen omeprazole (PRILOSEC) 20 MG capsule Take 20 mg by mouth daily.    . sucralfate (CARAFATE) 1 GM/10ML suspension Take 1 g by mouth 4 (four) times daily as needed.    . traMADol (ULTRAM) 50 MG tablet Take 1 tablet (50 mg total) by mouth every 6 (six) hours as needed. 30 tablet 1  . vitamin E 400 UNIT capsule Take 400 Units by mouth daily.    .  citalopram (CELEXA) 10 MG tablet   10  . hyoscyamine (LEVSIN, ANASPAZ) 0.125 MG tablet Take 0.125 mg by mouth as needed.    . vitamin E 100 UNIT capsule Take by mouth.     No current facility-administered medications on file prior to visit.     There are no Patient Instructions on file for this visit. No Follow-up on file.   Joselynne Killam A Kelso Bibby, PA-C

## 2017-03-18 ENCOUNTER — Observation Stay
Admission: EM | Admit: 2017-03-18 | Discharge: 2017-03-20 | Disposition: A | Discharge status: Skilled Nursing Facility | Payer: Medicare Other | Attending: Internal Medicine | Admitting: Internal Medicine

## 2017-03-18 ENCOUNTER — Emergency Department: Payer: Medicare Other

## 2017-03-18 DIAGNOSIS — Z881 Allergy status to other antibiotic agents status: Secondary | ICD-10-CM | POA: Insufficient documentation

## 2017-03-18 DIAGNOSIS — W19XXXA Unspecified fall, initial encounter: Secondary | ICD-10-CM | POA: Insufficient documentation

## 2017-03-18 DIAGNOSIS — I4891 Unspecified atrial fibrillation: Secondary | ICD-10-CM | POA: Insufficient documentation

## 2017-03-18 DIAGNOSIS — E039 Hypothyroidism, unspecified: Secondary | ICD-10-CM | POA: Diagnosis not present

## 2017-03-18 DIAGNOSIS — Z882 Allergy status to sulfonamides status: Secondary | ICD-10-CM | POA: Diagnosis not present

## 2017-03-18 DIAGNOSIS — S2241XA Multiple fractures of ribs, right side, initial encounter for closed fracture: Principal | ICD-10-CM | POA: Insufficient documentation

## 2017-03-18 DIAGNOSIS — Y92002 Bathroom of unspecified non-institutional (private) residence single-family (private) house as the place of occurrence of the external cause: Secondary | ICD-10-CM | POA: Diagnosis not present

## 2017-03-18 DIAGNOSIS — R413 Other amnesia: Secondary | ICD-10-CM | POA: Diagnosis not present

## 2017-03-18 DIAGNOSIS — I452 Bifascicular block: Secondary | ICD-10-CM | POA: Insufficient documentation

## 2017-03-18 DIAGNOSIS — S2222XA Fracture of body of sternum, initial encounter for closed fracture: Secondary | ICD-10-CM | POA: Diagnosis not present

## 2017-03-18 DIAGNOSIS — Z90710 Acquired absence of both cervix and uterus: Secondary | ICD-10-CM | POA: Insufficient documentation

## 2017-03-18 DIAGNOSIS — M4802 Spinal stenosis, cervical region: Secondary | ICD-10-CM | POA: Diagnosis not present

## 2017-03-18 DIAGNOSIS — M5481 Occipital neuralgia: Secondary | ICD-10-CM | POA: Diagnosis not present

## 2017-03-18 DIAGNOSIS — I872 Venous insufficiency (chronic) (peripheral): Secondary | ICD-10-CM | POA: Diagnosis not present

## 2017-03-18 DIAGNOSIS — M1990 Unspecified osteoarthritis, unspecified site: Secondary | ICD-10-CM | POA: Insufficient documentation

## 2017-03-18 DIAGNOSIS — M50323 Other cervical disc degeneration at C6-C7 level: Secondary | ICD-10-CM | POA: Diagnosis not present

## 2017-03-18 DIAGNOSIS — Z86718 Personal history of other venous thrombosis and embolism: Secondary | ICD-10-CM | POA: Insufficient documentation

## 2017-03-18 DIAGNOSIS — M50322 Other cervical disc degeneration at C5-C6 level: Secondary | ICD-10-CM | POA: Diagnosis not present

## 2017-03-18 DIAGNOSIS — G5600 Carpal tunnel syndrome, unspecified upper limb: Secondary | ICD-10-CM | POA: Diagnosis not present

## 2017-03-18 DIAGNOSIS — D649 Anemia, unspecified: Secondary | ICD-10-CM

## 2017-03-18 DIAGNOSIS — Z888 Allergy status to other drugs, medicaments and biological substances status: Secondary | ICD-10-CM | POA: Diagnosis not present

## 2017-03-18 DIAGNOSIS — Z87891 Personal history of nicotine dependence: Secondary | ICD-10-CM | POA: Insufficient documentation

## 2017-03-18 DIAGNOSIS — K219 Gastro-esophageal reflux disease without esophagitis: Secondary | ICD-10-CM | POA: Insufficient documentation

## 2017-03-18 DIAGNOSIS — Y93E1 Activity, personal bathing and showering: Secondary | ICD-10-CM | POA: Insufficient documentation

## 2017-03-18 DIAGNOSIS — S2220XA Unspecified fracture of sternum, initial encounter for closed fracture: Secondary | ICD-10-CM

## 2017-03-18 DIAGNOSIS — S2249XA Multiple fractures of ribs, unspecified side, initial encounter for closed fracture: Secondary | ICD-10-CM | POA: Diagnosis present

## 2017-03-18 DIAGNOSIS — R531 Weakness: Secondary | ICD-10-CM

## 2017-03-18 DIAGNOSIS — I890 Lymphedema, not elsewhere classified: Secondary | ICD-10-CM | POA: Insufficient documentation

## 2017-03-18 DIAGNOSIS — Z8711 Personal history of peptic ulcer disease: Secondary | ICD-10-CM | POA: Insufficient documentation

## 2017-03-18 DIAGNOSIS — M810 Age-related osteoporosis without current pathological fracture: Secondary | ICD-10-CM | POA: Diagnosis not present

## 2017-03-18 DIAGNOSIS — S2239XA Fracture of one rib, unspecified side, initial encounter for closed fracture: Secondary | ICD-10-CM | POA: Diagnosis present

## 2017-03-18 DIAGNOSIS — L03119 Cellulitis of unspecified part of limb: Secondary | ICD-10-CM | POA: Insufficient documentation

## 2017-03-18 DIAGNOSIS — I2510 Atherosclerotic heart disease of native coronary artery without angina pectoris: Secondary | ICD-10-CM | POA: Diagnosis not present

## 2017-03-18 DIAGNOSIS — R0781 Pleurodynia: Secondary | ICD-10-CM

## 2017-03-18 LAB — CBC WITH DIFFERENTIAL/PLATELET
BASOS ABS: 0.1 10*3/uL (ref 0–0.1)
Basophils Relative: 1 %
EOS ABS: 0.1 10*3/uL (ref 0–0.7)
Eosinophils Relative: 3 %
HCT: 28.8 % — ABNORMAL LOW (ref 35.0–47.0)
Hemoglobin: 9.7 g/dL — ABNORMAL LOW (ref 12.0–16.0)
LYMPHS PCT: 28 %
Lymphs Abs: 1.3 10*3/uL (ref 1.0–3.6)
MCH: 29.7 pg (ref 26.0–34.0)
MCHC: 33.7 g/dL (ref 32.0–36.0)
MCV: 88 fL (ref 80.0–100.0)
Monocytes Absolute: 0.7 10*3/uL (ref 0.2–0.9)
Monocytes Relative: 15 %
NEUTROS PCT: 53 %
Neutro Abs: 2.4 10*3/uL (ref 1.4–6.5)
PLATELETS: 234 10*3/uL (ref 150–440)
RBC: 3.27 MIL/uL — AB (ref 3.80–5.20)
RDW: 14.7 % — ABNORMAL HIGH (ref 11.5–14.5)
WBC: 4.6 10*3/uL (ref 3.6–11.0)

## 2017-03-18 LAB — PROTIME-INR
INR: 0.92
PROTHROMBIN TIME: 12.3 s (ref 11.4–15.2)

## 2017-03-18 LAB — TYPE AND SCREEN
ABO/RH(D): O NEG
Antibody Screen: NEGATIVE

## 2017-03-18 LAB — COMPREHENSIVE METABOLIC PANEL
ALT: 21 U/L (ref 14–54)
ANION GAP: 5 (ref 5–15)
AST: 28 U/L (ref 15–41)
Albumin: 3.7 g/dL (ref 3.5–5.0)
Alkaline Phosphatase: 66 U/L (ref 38–126)
BUN: 23 mg/dL — ABNORMAL HIGH (ref 6–20)
CHLORIDE: 103 mmol/L (ref 101–111)
CO2: 29 mmol/L (ref 22–32)
CREATININE: 0.56 mg/dL (ref 0.44–1.00)
Calcium: 8.5 mg/dL — ABNORMAL LOW (ref 8.9–10.3)
Glucose, Bld: 93 mg/dL (ref 65–99)
POTASSIUM: 4 mmol/L (ref 3.5–5.1)
Sodium: 137 mmol/L (ref 135–145)
Total Bilirubin: 0.5 mg/dL (ref 0.3–1.2)
Total Protein: 6.8 g/dL (ref 6.5–8.1)

## 2017-03-18 LAB — TROPONIN I

## 2017-03-18 MED ORDER — PANTOPRAZOLE SODIUM 40 MG PO TBEC
40.0000 mg | DELAYED_RELEASE_TABLET | Freq: Every day | ORAL | Status: DC
  Administered 2017-03-19 – 2017-03-20 (×2): 40 mg via ORAL
  Filled 2017-03-18 (×2): qty 1

## 2017-03-18 MED ORDER — VITAMIN E 180 MG (400 UNIT) PO CAPS
400.0000 [IU] | ORAL_CAPSULE | Freq: Every day | ORAL | Status: DC
  Administered 2017-03-18 – 2017-03-20 (×3): 400 [IU] via ORAL
  Filled 2017-03-18 (×3): qty 1

## 2017-03-18 MED ORDER — SODIUM CHLORIDE 0.9% FLUSH: 3.0000 mL | INTRAVENOUS | Status: DC | PRN

## 2017-03-18 MED ORDER — TRAMADOL HCL 50 MG PO TABS
50.0000 mg | ORAL_TABLET | Freq: Four times a day (QID) | ORAL | Status: DC | PRN
  Administered 2017-03-18: 50 mg via ORAL
  Filled 2017-03-18: qty 1

## 2017-03-18 MED ORDER — POTASSIUM GLUCONATE 550 MG PO TABS: 1.0000 | ORAL_TABLET | Freq: Every evening | ORAL | Status: DC

## 2017-03-18 MED ORDER — ACETAMINOPHEN 650 MG RE SUPP: 650.0000 mg | Freq: Four times a day (QID) | RECTAL | Status: DC | PRN

## 2017-03-18 MED ORDER — OMEGA-3-ACID ETHYL ESTERS 1 G PO CAPS
1.0000 g | ORAL_CAPSULE | Freq: Every day | ORAL | Status: DC
  Administered 2017-03-18 – 2017-03-20 (×3): 1 g via ORAL
  Filled 2017-03-18 (×3): qty 1

## 2017-03-18 MED ORDER — CARBAMAZEPINE ER 200 MG PO CP12: 200.0000 mg | ORAL_CAPSULE | Freq: Two times a day (BID) | ORAL | Status: DC

## 2017-03-18 MED ORDER — HYDROCODONE-ACETAMINOPHEN 5-325 MG PO TABS
1.0000 | ORAL_TABLET | Freq: Four times a day (QID) | ORAL | Status: DC | PRN
  Administered 2017-03-18 – 2017-03-19 (×3): 1 via ORAL
  Filled 2017-03-18 (×3): qty 1

## 2017-03-18 MED ORDER — CYANOCOBALAMIN 500 MCG PO TABS
500.0000 ug | ORAL_TABLET | Freq: Every day | ORAL | Status: DC
  Administered 2017-03-18 – 2017-03-20 (×3): 500 ug via ORAL
  Filled 2017-03-18 (×3): qty 1

## 2017-03-18 MED ORDER — BIOTIN 1000 MCG PO TABS: ORAL_TABLET | Freq: Every day | ORAL | Status: DC

## 2017-03-18 MED ORDER — FENTANYL CITRATE (PF) 100 MCG/2ML IJ SOLN
25.0000 ug | Freq: Once | INTRAMUSCULAR | Status: AC
  Administered 2017-03-18: 25 ug via INTRAVENOUS
  Filled 2017-03-18: qty 2

## 2017-03-18 MED ORDER — LEVOTHYROXINE SODIUM 100 MCG PO TABS
100.0000 ug | ORAL_TABLET | Freq: Every day | ORAL | Status: DC
  Administered 2017-03-19 – 2017-03-20 (×2): 100 ug via ORAL
  Filled 2017-03-18 (×2): qty 1

## 2017-03-18 MED ORDER — ACETAMINOPHEN 325 MG PO TABS: 650.0000 mg | ORAL_TABLET | Freq: Four times a day (QID) | ORAL | Status: DC | PRN

## 2017-03-18 MED ORDER — CARBAMAZEPINE ER 100 MG PO TB12
500.0000 mg | ORAL_TABLET | Freq: Two times a day (BID) | ORAL | Status: DC
  Administered 2017-03-18 – 2017-03-20 (×4): 500 mg via ORAL
  Filled 2017-03-18 (×6): qty 5

## 2017-03-18 MED ORDER — SODIUM CHLORIDE 0.9% FLUSH
3.0000 mL | Freq: Two times a day (BID) | INTRAVENOUS | Status: DC
  Administered 2017-03-18 – 2017-03-20 (×5): 3 mL via INTRAVENOUS

## 2017-03-18 MED ORDER — CALCIUM CARBONATE-VITAMIN D 500-200 MG-UNIT PO TABS
1.0000 | ORAL_TABLET | Freq: Two times a day (BID) | ORAL | Status: DC
  Administered 2017-03-18 – 2017-03-20 (×5): 1 via ORAL
  Filled 2017-03-18 (×5): qty 1

## 2017-03-18 MED ORDER — TETANUS-DIPHTH-ACELL PERTUSSIS 5-2.5-18.5 LF-MCG/0.5 IM SUSP
0.5000 mL | Freq: Once | INTRAMUSCULAR | Status: AC
  Administered 2017-03-18: 0.5 mL via INTRAMUSCULAR
  Filled 2017-03-18: qty 0.5

## 2017-03-18 MED ORDER — OXYCODONE-ACETAMINOPHEN 5-325 MG PO TABS
1.0000 | ORAL_TABLET | Freq: Once | ORAL | Status: AC
  Administered 2017-03-18: 1 via ORAL
  Filled 2017-03-18: qty 1

## 2017-03-18 MED ORDER — HEPARIN SODIUM (PORCINE) 5000 UNIT/ML IJ SOLN
5000.0000 [IU] | Freq: Three times a day (TID) | INTRAMUSCULAR | Status: DC
  Administered 2017-03-18 – 2017-03-20 (×6): 5000 [IU] via SUBCUTANEOUS
  Filled 2017-03-18 (×6): qty 1

## 2017-03-18 MED ORDER — SODIUM CHLORIDE 0.9 % IV SOLN: 250.0000 mL | INTRAVENOUS | Status: DC | PRN

## 2017-03-18 MED ORDER — GABAPENTIN 300 MG PO CAPS
300.0000 mg | ORAL_CAPSULE | Freq: Two times a day (BID) | ORAL | Status: DC
Start: 2017-03-18 — End: 2017-03-20
  Administered 2017-03-18 – 2017-03-20 (×5): 300 mg via ORAL
  Filled 2017-03-18 (×5): qty 1

## 2017-03-18 NOTE — ED Notes (Signed)
Attempted to call report x 1  

## 2017-03-18 NOTE — Progress Notes (Signed)
PHARMACIST - PHYSICIAN ORDER COMMUNICATION  CONCERNING: P&T Medication Policy on Herbal Medications  DESCRIPTION:  This patient's order for:  biotin  has been noted.  This product(s) is classified as an "herbal" or natural product. Due to a lack of definitive safety studies or FDA approval, nonstandard manufacturing practices, plus the potential risk of unknown drug-drug interactions while on inpatient medications, the Pharmacy and Therapeutics Committee does not permit the use of "herbal" or natural products of this type within [facility].   ACTION TAKEN: The pharmacy department is unable to verify this order at this time Please reevaluate patient's clinical condition at discharge and address if the herbal or natural product(s) should be resumed at that time.   

## 2017-03-18 NOTE — Progress Notes (Signed)
Admission: Patient alert and oriented. Bruising on right face. Patient 5-10 per patient. Patient complaining of pain during deep breathing. Patient informed RN that she lives at home with husband and has had multiple falls within the last 6 months. Patient uses walker at home. Wants to discuss code status with MD, RN notified MD.   Harvie HeckMelanie Aviya Jarvie, RN

## 2017-03-18 NOTE — Progress Notes (Signed)
Chaplain received OR to visit patient in room 148 for advance directive education. Chaplain visited patient and family. Stated patient needing to use restroom and would contact Chaplain later on.     03/18/17 1500  Clinical Encounter Type  Visited With Patient and family together  Visit Type Initial  Referral From Nurse  Consult/Referral To Chaplain

## 2017-03-18 NOTE — ED Provider Notes (Signed)
[facility] Emergency Department Provider Note  ____________________________________________   First MD Initiated Contact with Patient 03/18/17 [phone]     (approximate)  I have reviewed the triage vital signs and the nursing notes.   HISTORY  Chief Complaint Fall   HPI Suzanne Russo is a 83 y.o. female who comes to the emergency department via EMS after mechanical fall this morning. She is sitting on the toilet trying to tie her shoes when she leaned forward and fell forward and struck the left side of her face. She also struck her chest against the bathtub and her primary concern is pain in her right chest rate now. Pain is moderate severity worse with deep inspiration and improved with rest.She does have a laceration above her left brow. She says she is not on blood thinning medication. She denies loss of consciousness. She denies neck pain. She denies numbness or weakness.    Past Medical History:  Diagnosis Date  . Carpal tunnel syndrome   . Concussion with no loss of consciousness 08/26/2015  . Degenerative arthritis   . DVT (deep venous thrombosis) (HCC)   . GERD (gastroesophageal reflux disease)   . Hemorrhoids   . Hypothyroidism   . Memory difficulty 07/16/2014  . Occipital neuralgia   . Occipital neuralgia of left side 12/15/2014  . Osteoporosis   . Pelvic fracture (HCC)   . Peptic ulcer disease   . Right clavicle fracture   . Trigeminal neuralgia   . Venous insufficiency     Patient Active Problem List   Diagnosis Date Noted  . Rib fractures 03/18/2017  . Chronic venous insufficiency 03/14/2017  . Ankle ulcer, left, limited to breakdown of skin (HCC) 01/03/2017  . Cellulitis of lower extremity 11/14/2016  . Swelling of limb 11/14/2016  . Lymphedema 11/14/2016  . Headache disorder 08/26/2015  . Concussion with no loss of consciousness 08/26/2015  . Occipital neuralgia of left side 12/15/2014  . Memory difficulty 07/16/2014  .  Trigeminal neuralgia 07/11/2013    Past Surgical History:  Procedure Laterality Date  . bilateral cataract surgery    . bilateral knee surgery    . left hip fracture    . left thumb surgery    . left toe surgery    . NASAL SINUS SURGERY    . TONSILLECTOMY    . VAGINAL HYSTERECTOMY      Prior to Admission medications   Medication Sig Start Date End Date Taking? Authorizing Provider  Biotin 1000 MCG tablet Take 3,000 mg by mouth daily.    Yes [provider]  Calcium Carbonate-Vitamin D (CALCIUM + D PO) Take 1 tablet by mouth 2 (two) times daily.    Yes [provider]  carbamazepine (CARBATROL) 200 MG 12 hr capsule Take 1 capsule (200 mg total) by mouth 2 (two) times daily. 12/29/16  Yes York Spaniel, MD  carbamazepine (CARBATROL) 300 MG 12 hr capsule Take 1 capsule (300 mg total) by mouth 2 (two) times daily. 12/29/16  Yes York Spaniel, MD  celecoxib (CELEBREX) 200 MG capsule Take 200 mg by mouth daily. 08/19/15  Yes [provider]  Cyanocobalamin (B-12 PO) Take 1 tablet by mouth daily.    Yes [provider]  gabapentin (NEURONTIN) 100 MG capsule Take 2 capsules (200 mg total) by mouth 2 (two) times daily. Patient taking differently: Take 300 mg by mouth See admin instructions. tk 300mg  in the morning and 300mg  qhs 12/28/16  Yes York Spaniel,  MD  hydrocortisone (ANUSOL-HC) 25 MG suppository Place 25 mg rectally as needed.   Yes [provider]  levothyroxine (SYNTHROID, LEVOTHROID) 100 MCG tablet Take 100 mcg by mouth daily before breakfast.   Yes [provider]  loratadine-pseudoephedrine (CLARITIN-D 24-HOUR) 10-240 MG 24 hr tablet Take 1 tablet by mouth daily.   Yes [provider]  meclizine (ANTIVERT) 25 MG tablet Take 25 mg by mouth every 6 (six) hours as needed.   Yes [provider]  Omega-3 Fatty Acids (FISH OIL) 1000 MG CAPS Take 1,000 mg by mouth daily.    Yes [provider]    omeprazole (PRILOSEC) 20 MG capsule Take 20 mg by mouth daily.   Yes [provider]  Potassium Gluconate 550 MG TABS Take 1 tablet by mouth every evening.   Yes [provider]  traMADol (ULTRAM) 50 MG tablet Take 1 tablet (50 mg total) by mouth every 6 (six) hours as needed. 01/09/17  Yes York Spaniel, MD  triamcinolone cream (KENALOG) 0.1 % Apply 1 application topically 2 (two) times daily. Compounded with eucerin   Yes [provider]  vitamin E 400 UNIT capsule Take 400 Units by mouth daily.   Yes [provider]  hyoscyamine (LEVSIN, ANASPAZ) 0.125 MG tablet Take 0.125 mg by mouth as needed. 07/16/14 07/26/14  [provider]    Allergies Citalopram; Sulfa antibiotics; and Tetracycline  Family History  Problem Relation Age of Onset  . Heart disease Mother   . COPD Father   . Congestive Heart Failure Brother     Social History Social History  Substance Use Topics  . Smoking status: Former Smoker    Packs/day: 1.00    Years: 45.00    Types: Cigarettes  . Smokeless tobacco: Never Used  . Alcohol use 4.2 oz/week    7 Glasses of wine per week     Comment: wine on occasion    Review of Systems Constitutional: No fever/chills Eyes: No visual changes. ENT: No sore throat. Cardiovascular: Positive chest pain. Respiratory: Denies shortness of breath. Gastrointestinal: No abdominal pain.  No nausea, no vomiting.  No diarrhea.  No constipation. Genitourinary: Negative for dysuria. Musculoskeletal: Negative for back pain. Skin: Positive for wound Neurological: Positive for headache   ____________________________________________   PHYSICAL EXAM:  VITAL SIGNS: ED Triage Vitals  Enc Vitals Group     BP 03/18/17 0814 (!) 152/78     Pulse Rate 03/18/17 0814 68     Resp 03/18/17 0814 14     Temp 03/18/17 0816 98.5 F (36.9 C)     Temp Source 03/18/17 0816 Oral     SpO2 03/18/17 0814 95 %     Weight 03/18/17 0815 120 lb  (54.4 kg)     Height 03/18/17 0815 5\' 3"  (1.6 m)     Head Circumference --      Peak Flow --      Pain Score 03/18/17 0814 10     Pain Loc --      Pain Edu? --      Excl. in GC? --     Constitutional: Alert and oriented x 4 well appearing nontoxic no diaphoresis speaks in full, clear sentences Eyes: PERRL EOMI. Head: 2 cm laceration above left brow Nose: No congestion/rhinnorhea. Mouth/Throat: No trismus Neck: No stridor.  No midline tenderness Cardiovascular: Normal rate, regular rhythm. Grossly normal heart sounds.  Good peripheral circulation. Tender across right anterior chest although no bruising no crepitus Respiratory: Normal  respiratory effort.  No retractions. Lungs CTAB and moving good air Gastrointestinal: Soft nontender Musculoskeletal: No lower extremity edema   Neurologic:  Normal speech and language. No gross focal neurologic deficits are appreciated. Skin:  Skin is warm, dry and intact. No rash noted. Psychiatric: Mood and affect are normal. Speech and behavior are normal.    ____________________________________________   DIFFERENTIAL  Rib contusion, rib fracture, sternal fracture, pneumothorax, hemothorax, pulmonary contusion, intracerebral hemorrhage, cervical spine fracture, laceration ____________________________________________   LABS (all labs ordered are listed, but only abnormal results are displayed)  Labs Reviewed  COMPREHENSIVE METABOLIC PANEL - Abnormal; Notable for the following:       Result Value   BUN 23 (*)    Calcium 8.5 (*)    All other components within normal limits  CBC WITH DIFFERENTIAL/PLATELET - Abnormal; Notable for the following:    RBC 3.27 (*)    Hemoglobin 9.7 (*)    HCT 28.8 (*)    RDW 14.7 (*)    All other components within normal limits  CBC - Abnormal; Notable for the following:    RBC 3.38 (*)    Hemoglobin 10.3 (*)    HCT 29.9 (*)    RDW 14.9 (*)    All other components within normal limits  TROPONIN I    PROTIME-INR  TYPE AND SCREEN    No signs of acute ischemia __________________________________________  EKG  ED ECG REPORT I, Merrily BrittleNeil Chananya Canizalez, the attending physician, personally viewed and interpreted this ECG.  Date: 03/18/2017 Rate: 65 Rhythm: normal sinus rhythm QRS Axis: normal Intervals: Prolonged QTC ST/T Wave abnormalities: normal Conduction Disturbances: Bifascicular block Narrative Interpretation: Abnormal but no signs of acute ischemia. Bifascicular block is old.  ____________________________________________  RADIOLOGY  CT scan shows sternal fracture along with multiple right-sided rib fractures ____________________________________________   PROCEDURES  Procedure(s) performed: no  Procedures  Critical Care performed: yes  CRITICAL CARE Performed by: Merrily BrittleNeil Lakeesha Fontanilla   Total critical care time: 32 minutes  Critical care time was exclusive of separately billable procedures and treating other patients.  Critical care was necessary to treat or prevent imminent or life-threatening deterioration.  Critical care was time spent personally by me on the following activities: development of treatment plan with patient and/or surrogate as well as nursing, discussions with consultants, evaluation of patient's response to treatment, examination of patient, obtaining history from patient or surrogate, ordering and performing treatments and interventions, ordering and review of laboratory studies, ordering and review of radiographic studies, pulse oximetry and re-evaluation of patient's condition.   Observation: no ____________________________________________   INITIAL IMPRESSION / ASSESSMENT AND PLAN / ED COURSE  Pertinent labs & imaging results that were available during my care of the patient were reviewed by me and considered in my medical decision making (see chart for details).  The patient arrives to Make an uncomfortable appearing after a concerning  mechanical fall. She is significantly tender across her chest. I'm worried about rib fractures, pneumothorax, hemothorax etc. Scans are pending.  The patient's CT scan is negative for pulmonary injury but does show a broken sternum as well as multiple broken ribs. She has required multiple doses of IV pain medication and I'm concerned that she will develop atelectasis, pneumonia and could possibly die from this degree of pain. At this point she'll require inpatient admission.      ____________________________________________   FINAL CLINICAL IMPRESSION(S) / ED DIAGNOSES  Final diagnoses:  Fall, initial encounter  Closed fracture of sternum, unspecified portion of sternum,  initial encounter  Closed fracture of multiple ribs of right side, initial encounter      NEW MEDICATIONS STARTED DURING THIS VISIT:  Current Discharge Medication List       Note:  This document was prepared using Dragon voice recognition software and may include unintentional dictation errors.     Merrily Brittle, MD 03/19/17 1058

## 2017-03-18 NOTE — H&P (Signed)
Suzanne Russo is an 83 y.o. female.   Chief Complaint: Fall in rib pain. HPI: This 83 year old female who early this morning fell while getting out of the bathtub. She did not complain of any dizziness, headache or chest pain before the event. She had trouble getting up secondary to pain in the right lower rib cage. She complains of pain in the right lower rib cage at times she takes of breath.  Past Medical History:  Diagnosis Date  . Carpal tunnel syndrome   . Concussion with no loss of consciousness 08/26/2015  . Degenerative arthritis   . DVT (deep venous thrombosis) ([place])   . GERD (gastroesophageal reflux disease)   . Hemorrhoids   . Hypothyroidism   . Memory difficulty 07/16/2014  . Occipital neuralgia   . Occipital neuralgia of left side 12/15/2014  . Osteoporosis   . Pelvic fracture ([place])   . Peptic ulcer disease   . Right clavicle fracture   . Trigeminal neuralgia   . Venous insufficiency     Past Surgical History:  Procedure Laterality Date  . bilateral cataract surgery    . bilateral knee surgery    . left hip fracture    . left thumb surgery    . left toe surgery    . NASAL SINUS SURGERY    . TONSILLECTOMY    . VAGINAL HYSTERECTOMY      Family History  Problem Relation Age of Onset  . Heart disease Mother   . COPD Father   . Congestive Heart Failure Brother    Social History:  reports that she has quit smoking. Her smoking use included Cigarettes. She has a 45.00 pack-year smoking history. She has never used smokeless tobacco. She reports that she drinks about 4.2 oz of alcohol per week . She reports that she does not use drugs.  Allergies:  Allergies  Allergen Reactions  . Citalopram Other (See Comments)    Caused bad dreams  . Sulfa Antibiotics Rash  . Tetracycline Rash     (Not in a hospital admission)  Results for orders placed or performed during the hospital encounter of 03/18/17 (from the past 48 hour(s))  Comprehensive metabolic panel      Status: Abnormal   Collection Time: 03/18/17  9:29 AM  Result Value Ref Range   Sodium 137 135 - 145 mmol/L   Potassium 4.0 3.5 - 5.1 mmol/L   Chloride 103 101 - 111 mmol/L   CO2 29 22 - 32 mmol/L   Glucose, Bld 93 65 - 99 mg/dL   BUN 23 (H) 6 - 20 mg/dL   Creatinine, Ser 0.56 0.44 - 1.00 mg/dL   Calcium 8.5 (L) 8.9 - 10.3 mg/dL   Total Protein 6.8 6.5 - 8.1 g/dL   Albumin 3.7 3.5 - 5.0 g/dL   AST 28 15 - 41 U/L   ALT 21 14 - 54 U/L   Alkaline Phosphatase 66 38 - 126 U/L   Total Bilirubin 0.5 0.3 - 1.2 mg/dL   GFR calc non Af Amer >60 >60 mL/min   GFR calc Af Amer >60 >60 mL/min    Comment: (NOTE) The eGFR has been calculated using the CKD EPI equation. This calculation has not been validated in all clinical situations. eGFR's persistently <60 mL/min signify possible Chronic Kidney Disease.    Anion gap 5 5 - 15  Troponin I     Status: None   Collection Time: 03/18/17  9:29 AM  Result Value Ref Range  Troponin I <0.03 <0.03 ng/mL  CBC with Differential     Status: Abnormal   Collection Time: 03/18/17  9:29 AM  Result Value Ref Range   WBC 4.6 3.6 - 11.0 K/uL   RBC 3.27 (L) 3.80 - 5.20 MIL/uL   Hemoglobin 9.7 (L) 12.0 - 16.0 g/dL   HCT 28.8 (L) 35.0 - 47.0 %   MCV 88.0 80.0 - 100.0 fL   MCH 29.7 26.0 - 34.0 pg   MCHC 33.7 32.0 - 36.0 g/dL   RDW 14.7 (H) 11.5 - 14.5 %   Platelets 234 150 - 440 K/uL   Neutrophils Relative % 53 %   Neutro Abs 2.4 1.4 - 6.5 K/uL   Lymphocytes Relative 28 %   Lymphs Abs 1.3 1.0 - 3.6 K/uL   Monocytes Relative 15 %   Monocytes Absolute 0.7 0.2 - 0.9 K/uL   Eosinophils Relative 3 %   Eosinophils Absolute 0.1 0 - 0.7 K/uL   Basophils Relative 1 %   Basophils Absolute 0.1 0 - 0.1 K/uL  Type and screen [facility]     Status: None   Collection Time: 03/18/17  9:29 AM  Result Value Ref Range   ABO/RH(D) O NEG    Antibody Screen NEG    Sample Expiration 03/21/2017   Protime-INR     Status: None   Collection Time:  03/18/17  9:29 AM  Result Value Ref Range   Prothrombin Time 12.3 11.4 - 15.2 seconds   INR 0.92    Ct Head Wo Contrast  Result Date: 03/18/2017 CLINICAL DATA:  83 year old female found down at home. Fell off toilet. Struck head and chest during the fall. Pain. EXAM: CT HEAD WITHOUT CONTRAST CT CERVICAL SPINE WITHOUT CONTRAST TECHNIQUE: Multidetector CT imaging of the head and cervical spine was performed following the standard protocol without intravenous contrast. Multiplanar CT image reconstructions of the cervical spine were also generated. COMPARISON:  Head CT without contrast 09/14/2015. FINDINGS: CT HEAD FINDINGS Brain: Cerebral volume is not significantly changed since 2016. Chronic Patchy and confluent periventricular white matter hypodensity. Some involvement of the deep white matter capsules. Small chronic lacunar infarct of the posterior right cerebellum is stable. No midline shift, ventriculomegaly, mass effect, evidence of mass lesion, intracranial hemorrhage or evidence of cortically based acute infarction. Vascular: Calcified atherosclerosis at the skull base. Skull: Stable and intact. Sinuses/Orbits: Previous paranasal sinus surgery. Increased nasal cavity and sphenoid mucosal thickening compared to 2016. Tympanic cavities and mastoids remain clear. Other: Broad-based left anterior convexity scalp hematoma measures up to 5 mm 6 mm in thickness. Underlying left frontal bone is intact. No other acute scalp or orbits soft tissue findings. CT CERVICAL SPINE FINDINGS Alignment: Straightening and mild reversal of cervical lordosis. Mild degenerative appearing anterolisthesis of C5 on C6 and C6 on C7. Trace anterolisthesis at C7-T1 and T1-T2 also appears degenerative. Skull base and vertebrae: Intact skullbase. Osteopenia. No acute cervical spine fracture identified. Soft tissues and spinal canal: No prevertebral fluid or swelling. No visible canal hematoma. Disc levels: Chronic or congenital  ankylosis of the C2 through C5 levels. Subsequent severe C1 C2 joint degeneration with complete joint space loss, extensive subchondral cystic change, sclerosis, and osteophytosis. Associated degenerative ligamentous hypertrophy resulting in spinal stenosis at the cervicomedullary junction level (series 7, image 22). Severe disc, endplate and posterior element degeneration at C5-C6 and C6-C7. Mild degenerative spinal stenosis suspected at C6-C7. Upper chest: Mild levoconvex upper thoracic scoliosis. Visible thoracic levels appear intact. Apical lung  scarring. IMPRESSION: 1. Left anterior scalp hematoma without underlying fracture. 2. No acute intracranial abnormality. Stable probable chronic small vessel disease changes in the bilateral cerebral white matter and right cerebellum. 3.  No acute fracture or listhesis identified in the cervical spine. 4. 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 Subsequent very severe spinal degeneration at C1 C2, C5-C6 and C6-C7. Associated degenerative spinal stenosis at the cervicomedullary junction and C6-C7. Electronically Signed   By: Genevie Ann M.D.   On: 03/18/2017 09:27   Ct Chest Wo Contrast  Result Date: 03/18/2017 CLINICAL DATA:  83 year old female found down at home. Fell off toilet. Struck head and chest during the fall. Pain. EXAM: CT CHEST WITHOUT CONTRAST TECHNIQUE: Multidetector CT imaging of the chest was performed following the standard protocol without IV contrast. COMPARISON:  Cervical spine CT from today reported separately. Chest CT 07/09/2014. FINDINGS: Cardiovascular: Calcified coronary artery atherosclerosis. No cardiomegaly or pericardial effusion. Mild tortuosity of the thoracic aorta. Mild calcified plaque in the upper abdominal aorta. Vascular patency is not evaluated in the absence of IV contrast. Mediastinum/Nodes: Negative noncontrast appearance of the mediastinum. No retrosternal or mediastinal hematoma. Lungs/Pleura: Major  airways are patent. Lower lung volumes today compared to 2015, with mild atelectasis. Chronic apical lung scarring appears stable. No pneumothorax. No pleural effusion. No pulmonary contusion or acute pulmonary opacity identified aside from atelectasis. Upper Abdomen: Negative noncontrast visible liver, spleen, pancreas, adrenal glands, left kidney, and splenic flexure (retained stool). Partially visible postoperative changes along the gastrohepatic ligament. Musculoskeletal: Subacute right anterolateral third and fourth rib fractures with periosteal new bone formation. Superimposed acute right anterolateral fifth rib fracture (series 3, image 101). There might be an associated nondisplaced right anterior sixth rib fracture (series 3, image 122). Chronic right lateral third and fourth rib fractures are stable since 2015. No other rib fracture identified. Nondisplaced fracture through the lower third of the sternum which is new since 2015 and appears acute. Chronic levoconvex upper thoracic scoliosis. Stable thoracic vertebral height and alignment since 2015. No vertebral fracture identified. IMPRESSION: 1. Acute non to minimally displaced fractures of the anterior right 5th and 6th ribs. 2. Acute minimally displaced fracture through the lower third of the sternum. 3. No other acute traumatic injury identified in the noncontrast chest. 4. Subacute/healing fractures of the right anterior third and fourth ribs, with underlying chronic fractures in the lateral segments of those same ribs. 5. Mild pulmonary scarring in atelectasis. 6. Calcified atherosclerosis of the coronary arteries and visible abdominal aorta. Electronically Signed   By: Genevie Ann M.D.   On: 03/18/2017 09:35   Ct Cervical Spine Wo Contrast  Result Date: 03/18/2017 CLINICAL DATA:  83 year old female found down at home. Fell off toilet. Struck head and chest during the fall. Pain. EXAM: CT HEAD WITHOUT CONTRAST CT CERVICAL SPINE WITHOUT CONTRAST  TECHNIQUE: Multidetector CT imaging of the head and cervical spine was performed following the standard protocol without intravenous contrast. Multiplanar CT image reconstructions of the cervical spine were also generated. COMPARISON:  Head CT without contrast 09/14/2015. FINDINGS: CT HEAD FINDINGS Brain: Cerebral volume is not significantly changed since 2016. Chronic Patchy and confluent periventricular white matter hypodensity. Some involvement of the deep white matter capsules. Small chronic lacunar infarct of the posterior right cerebellum is stable. No midline shift, ventriculomegaly, mass effect, evidence of mass lesion, intracranial hemorrhage or evidence of cortically based acute infarction. Vascular: Calcified atherosclerosis at the skull base. Skull: Stable and intact. Sinuses/Orbits: Previous  paranasal sinus surgery. Increased nasal cavity and sphenoid mucosal thickening compared to 2016. Tympanic cavities and mastoids remain clear. Other: Broad-based left anterior convexity scalp hematoma measures up to 5 mm 6 mm in thickness. Underlying left frontal bone is intact. No other acute scalp or orbits soft tissue findings. CT CERVICAL SPINE FINDINGS Alignment: Straightening and mild reversal of cervical lordosis. Mild degenerative appearing anterolisthesis of C5 on C6 and C6 on C7. Trace anterolisthesis at C7-T1 and T1-T2 also appears degenerative. Skull base and vertebrae: Intact skullbase. Osteopenia. No acute cervical spine fracture identified. Soft tissues and spinal canal: No prevertebral fluid or swelling. No visible canal hematoma. 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 Subsequent severe C1 C2 joint degeneration with complete joint space loss, extensive subchondral cystic change, sclerosis, and osteophytosis. Associated degenerative ligamentous hypertrophy resulting in spinal stenosis at the cervicomedullary junction level (series 7, image 22). Severe disc, endplate  and posterior element degeneration at C5-C6 and C6-C7. Mild degenerative spinal stenosis suspected at C6-C7. Upper chest: Mild levoconvex upper thoracic scoliosis. Visible thoracic levels appear intact. Apical lung scarring. IMPRESSION: 1. Left anterior scalp hematoma without underlying fracture. 2. No acute intracranial abnormality. Stable probable chronic small vessel disease changes in the bilateral cerebral white matter and right cerebellum. 3.  No acute fracture or listhesis identified in the cervical spine. 4. 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 Subsequent very severe spinal degeneration at C1 C2, C5-C6 and C6-C7. Associated degenerative spinal stenosis at the cervicomedullary junction and C6-C7. Electronically Signed   By: Genevie Ann M.D.   On: 03/18/2017 09:27   Dg Chest Port 1 View  Result Date: 03/18/2017 CLINICAL DATA:  Fall EXAM: PORTABLE CHEST 1 VIEW COMPARISON:  None. FINDINGS: There are skin folds towards the left apex. No obvious pneumothorax. Upper normal heart size. Chronic interstitial changes but no consolidation or lung mass. No pleural effusion. Normal vascularity. IMPRESSION: No active disease. Electronically Signed   By: Marybelle Killings M.D.   On: 03/18/2017 09:18    Review of Systems  Constitutional: Negative for chills and fever.  HENT: Negative for hearing loss.   Eyes: Negative for blurred vision.  Respiratory: Negative for shortness of breath.   Cardiovascular: Negative for chest pain.  Gastrointestinal: Negative for nausea and vomiting.  Genitourinary: Negative for dysuria.  Musculoskeletal: Negative for myalgias.  Skin: Negative for rash.  Neurological: Negative for dizziness.    Blood pressure (!) 146/70, pulse (!) 59, temperature 98.5 F (36.9 C), temperature source Oral, resp. rate 12, height '5\' 3"'  (1.6 m), weight 54.4 kg (120 lb), SpO2 96 %. Physical Exam  Constitutional: She is oriented to person, place, and time. She appears  well-developed and well-nourished. No distress.  HENT:  Mouth/Throat: Oropharynx is clear and moist. No oropharyngeal exudate.  Lacerations left forehead.  Eyes: Pupils are equal, round, and reactive to light. Scleral icterus is present.  Neck: Normal range of motion. No JVD present. No tracheal deviation present. No thyromegaly present.  Cardiovascular: Normal rate and regular rhythm.   No murmur heard. Respiratory:  Tenderness in the right sternal area. Lungs clear to auscultation. No dullness to percussion. No use of accessory muscles.  GI: Soft. Bowel sounds are normal. She exhibits no distension and no mass. There is no tenderness.  Musculoskeletal: She exhibits no edema or tenderness.  Lymphadenopathy:    She has no cervical adenopathy.  Neurological: She is alert and oriented to person, place, and time.  No cranial nerve deficit.  Skin: Skin is warm and dry.     Assessment/Plan 1. Right rib and sternal fracture. This was secondary from the fall. She is splinting and taking shallow breaths. She's had several doses of pain medication. We'll go ahead and admit her for observation for better pain control will go ahead and put her on incentive spirometry as she has a risk for an pneumonia secondary to splinting. 2. Fall. There was no history of syncope. She does remember tripping getting out of the bathtub. 3. Chronic anemia. Hemoglobin appears to be stable compared to past levels. No further workup. 4. Hypothyroidism. Continue her Synthroid.  Total time spent 45 minutes  Baxter Hire, MD 03/18/2017, 11:48 AM

## 2017-03-18 NOTE — ED Notes (Addendum)
Pt reports left leg hurting also

## 2017-03-18 NOTE — ED Triage Notes (Signed)
Per EMS pt was at home using the bathroom.  Husband found pt on the floor.  Pt states she was reaching to put her shoes on while on the toilet.  Pt hit her head and chest during the fall.  Pt denies LOC and pain is 10/10.

## 2017-03-18 NOTE — Care Management Obs Status (Signed)
MEDICARE OBSERVATION STATUS NOTIFICATION   Patient Details  Name: Suzanne Russo MRN: [id number] Date of Birth: 08-16-1932   Medicare Observation Status Notification Given:  Yes (Moon letter given)  Waynetta SandyMoon letter discussed with husband at bedside. Husband was adamant that 83yo [name] "does not have Medicare. It is illegal for her to have Medicare. She has [employer]." This Clinical research associatewriter explained that [name] BCBS policy was a supplement to her [employer]icare policy but neither Mr or [name] was interested in discussing Medicare Observation status further. Copy of Moon letter left at bedside. Moon letter is unsigned at this time.     Aravind Chrismer A, RN 03/18/2017, 4:10 PM

## 2017-03-18 NOTE — ED Notes (Signed)
Pt transported to room 148 ?

## 2017-03-19 LAB — CBC
HEMATOCRIT: 29.9 % — AB (ref 35.0–47.0)
HEMOGLOBIN: 10.3 g/dL — AB (ref 12.0–16.0)
MCH: 30.5 pg (ref 26.0–34.0)
MCHC: 34.4 g/dL (ref 32.0–36.0)
MCV: 88.5 fL (ref 80.0–100.0)
Platelets: 248 10*3/uL (ref 150–440)
RBC: 3.38 MIL/uL — ABNORMAL LOW (ref 3.80–5.20)
RDW: 14.9 % — AB (ref 11.5–14.5)
WBC: 6.6 10*3/uL (ref 3.6–11.0)

## 2017-03-19 MED ORDER — OXYCODONE HCL 5 MG PO TABS
5.0000 mg | ORAL_TABLET | ORAL | Status: DC | PRN
  Administered 2017-03-19 – 2017-03-20 (×5): 5 mg via ORAL
  Filled 2017-03-19 (×5): qty 1

## 2017-03-19 MED ORDER — OXYCODONE HCL 5 MG PO TABS: 5.0000 mg | ORAL_TABLET | ORAL | Status: DC | PRN

## 2017-03-19 MED ORDER — MORPHINE SULFATE (PF) 2 MG/ML IV SOLN
2.0000 mg | INTRAVENOUS | Status: DC | PRN
  Administered 2017-03-19: 2 mg via INTRAVENOUS
  Filled 2017-03-19: qty 1

## 2017-03-19 NOTE — Evaluation (Signed)
Physical Therapy Evaluation Patient Details Name: Suzanne Russo MRN: [id number] DOB: 1932/01/29 Today's Date: 03/19/2017   History of Present Illness  presented to ER secondary to fall in bathroom with R lower rib-cage pain; admitted with R 5-6th rib fractures and distal 1/3 of sternum fracture.   Clinical Impression  Upon evaluation, patient alert and oriented; follows all commands and demonstrates good safety awareness/insight.  Bilat UE elevation limited due to pain, otherwise, strength and ROM grossly symmetrical and WFL for basic transfers and mobility. Educated in 'light' sternal precautions for comfort (encouraged to minimize pressure through chest, push with one arm vs. Two with functional activities); patient/daughter voiced understanding.  Demonstrates ability to complete bed mobility, sit/stand, basic transfers and gait (40') with RW and 4WRW with cga for optimal safety.  Slow and guarded, but no overt buckling or LOB.  Patient reporting optimal comfort/confidence with RW (has one at home already); do recommend continued use of RW at all times due to pain and higher-level balance deficits.  Patient/daughter voiced understanding. Would benefit from skilled PT to address above deficits and promote optimal return to PLOF; Recommend transition to HHPT upon discharge from acute hospitalization.     Follow Up Recommendations Home health PT    Equipment Recommendations   (has RW, 4WRW, SPC and BSC)    Recommendations for Other Services       Precautions / Restrictions Precautions Precautions: Fall Restrictions Weight Bearing Restrictions: No      Mobility  Bed Mobility Overal bed mobility: Needs Assistance Bed Mobility: Supine to Sit     Supine to sit: Supervision     General bed mobility comments: cuing for hand placement to decreased stress across sternum/chest  Transfers Overall transfer level: Modified independent Equipment used: Rolling walker (2 wheeled) Transfers:  Sit to/from Stand Sit to Stand: Min guard;Min assist         General transfer comment: cuing for hand placement (encouraged for single vs bilat UEs)  Ambulation/Gait Ambulation/Gait assistance: Min assist;Min guard Ambulation Distance (Feet): 40 Feet Assistive device: Rolling walker (2 wheeled)       General Gait Details: narrowed BOS with fair step height/length; min cuing for minimal WBing bilat UEs (to minimize pressure through chest).  Slow and guarded, but no overt buckling or LOB.  Stairs            Wheelchair Mobility    Modified Rankin (Stroke Patients Only)       Balance Overall balance assessment: Needs assistance Sitting-balance support: No upper extremity supported;Feet supported Sitting balance-Leahy Scale: Good     Standing balance support: Bilateral upper extremity supported Standing balance-Leahy Scale: Fair                               Pertinent Vitals/Pain Pain Assessment: 0-10 Pain Score: 5  Pain Location: R ribs, chest Pain Descriptors / Indicators: Aching;Grimacing;Guarding Pain Intervention(s): Limited activity within patient's tolerance;Monitored during session;Repositioned    Home Living Family/patient expects to be discharged to:: Private residence Living Arrangements: Spouse/significant other Available Help at Discharge: Family Type of Home: House Home Access: Stairs to enter Entrance Stairs-Rails: Lawyer of Steps: 2 Home Layout: Laundry or work area in basement;One level Home Equipment: Walker - 2 wheels;Walker - 4 wheels;Cane - single point;Bedside commode      Prior Function Level of Independence: Independent with assistive device(s)         Comments: Mod indep for ADLs, household  activities with intermittent use of rollator; SPC outside of the home.  Does endorse multiple fall history.  Per daughter, family (son and other daughter) are planning to come into town to stay with  patient/husband for increased support in home environment upon discharge.     Hand Dominance        Extremity/Trunk Assessment   Upper Extremity Assessment Upper Extremity Assessment: Overall WFL for tasks assessed (UE elevation guarded (shoulder-height) due to pain)    Lower Extremity Assessment Lower Extremity Assessment: Overall WFL for tasks assessed       Communication   Communication: No difficulties  Cognition Arousal/Alertness: Awake/alert Behavior During Therapy: WFL for tasks assessed/performed Overall Cognitive Status: Within Functional Limits for tasks assessed                                        General Comments      Exercises Other Exercises Other Exercises: Toilet transfer, ambulatory with RW, cga/min assist; good safety awaress with RW and environmental barriers.  Sit/stand from [facility] with RW, cga.  Reviewed use of BSC in home environment (bedside at night, frame over toilet in daytime) Other Exercises: 90' with 4WRW, cga/close sup-minimal change in gait performance, though patient voicing optimal comfort/confidence with RW at this time.   Assessment/Plan    PT Assessment Patient needs continued PT services  PT Problem List Decreased strength;Decreased range of motion;Decreased balance;Decreased activity tolerance;Decreased mobility;Decreased coordination;Decreased knowledge of use of DME;Decreased safety awareness;Decreased knowledge of precautions;Pain       PT Treatment Interventions DME instruction;Gait training;Stair training;Functional mobility training;Therapeutic activities;Therapeutic exercise;Balance training;Patient/family education    PT Goals (Current goals can be found in the Care Plan section)  Acute Rehab PT Goals Patient Stated Goal: to return home PT Goal Formulation: With patient/family Time For Goal Achievement: 04/02/17 Potential to Achieve Goals: Good    Frequency 7X/week   Barriers to discharge         Co-evaluation               AM-PAC PT "6 Clicks" Daily Activity  Outcome Measure Difficulty turning over in bed (including adjusting bedclothes, sheets and blankets)?: Total Difficulty moving from lying on back to sitting on the side of the bed? : Total Difficulty sitting down on and standing up from a chair with arms (e.g., wheelchair, bedside commode, etc,.)?: Total Help needed moving to and from a bed to chair (including a wheelchair)?: A Little Help needed walking in hospital room?: A Little Help needed climbing 3-5 steps with a railing? : A Little 6 Click Score: 12    End of Session Equipment Utilized During Treatment: Gait belt Activity Tolerance: Patient tolerated treatment well Patient left: in chair;with call bell/phone within reach;with chair alarm set;with family/visitor present Nurse Communication: Mobility status PT Visit Diagnosis: Muscle weakness (generalized) (M62.81);Difficulty in walking, not elsewhere classified (R26.2)    Time: 1610-9604 PT Time Calculation (min) (ACUTE ONLY): 52 min   Charges:   PT Evaluation $PT Eval Low Complexity: 1 Procedure PT Treatments $Gait Training: 8-22 mins $Therapeutic Activity: 8-22 mins   PT G Codes:   PT G-Codes **NOT FOR INPATIENT CLASS** Functional Assessment Tool Used: AM-PAC 6 Clicks Basic Mobility Functional Limitation: Mobility: Walking and moving around Mobility: Walking and Moving Around Current Status (V4098): At least 20 percent but less than 40 percent impaired, limited or restricted Mobility: Walking and Moving Around Goal Status [phone]):  At least 1 percent but less than 20 percent impaired, limited or restricted    Grainne Knights H. Manson PasseyBrown, PT, DPT, NCS 03/19/17, 12:39 PM [phone]

## 2017-03-19 NOTE — Progress Notes (Signed)
[facility] [place] [facility]   PATIENT NAME: Suzanne Russo    MR#:  [id number]  DATE OF BIRTH:  [birth year]  SUBJECTIVE:  CHIEF COMPLAINT:   Chief Complaint  Patient presents with  . Fall   The patient is a 83 year old Caucasian female with past medical history significant for history of DVT, hypothyroidism, pelvic fracture, trigeminal neuralgia, who presents to the hospital with complaints of fall, midsternal and right-sided chest pain, worsening with deep breathing. CT scan of the chest revealed right fifth, sixth rib fractures, lower third sternum fracture. The patient feels somewhat better today, pain is more tolerable. No cough or phlegm production   Review of Systems  Constitutional: Negative for chills, fever and weight loss.  HENT: Negative for congestion.   Eyes: Negative for blurred vision and double vision.  Respiratory: Negative for cough, sputum production, shortness of breath and wheezing.   Cardiovascular: Positive for chest pain. Negative for palpitations, orthopnea, leg swelling and PND.  Gastrointestinal: Negative for abdominal pain, blood in stool, constipation, diarrhea, nausea and vomiting.  Genitourinary: Negative for dysuria, frequency, hematuria and urgency.  Musculoskeletal: Negative for falls.  Neurological: Negative for dizziness, tremors, focal weakness and headaches.  Endo/Heme/Allergies: Does not bruise/bleed easily.  Psychiatric/Behavioral: Negative for depression. The patient does not have insomnia.     VITAL SIGNS: Blood pressure 109/63, pulse 69, temperature 98.3 F (36.8 C), temperature source Oral, resp. rate 17, height 5\' 3"  (1.6 m), weight 54.4 kg (120 lb), SpO2 97 %.  PHYSICAL EXAMINATION:   GENERAL:  83 y.o.-year-old patient lying in the bed with no acute distress.  EYES: Pupils equal, round, reactive to light and accommodation. No scleral icterus. Extraocular muscles intact.  HEENT: Head atraumatic,  normocephalic. Oropharynx and nasopharynx clear.  NECK:  Supple, no jugular venous distention. No thyroid enlargement, no tenderness.  LUNGS: Normal breath sounds bilaterally, no wheezing, rales,rhonchi or crepitation. No use of accessory muscles of respiration.  CARDIOVASCULAR: S1, S2 normal. No murmurs, rubs, or gallops. Painful right side of the chest and sternum palpation ABDOMEN: Soft, nontender, nondistended. Bowel sounds present. No organomegaly or mass.  EXTREMITIES: No pedal edema, cyanosis, or clubbing.  NEUROLOGIC: Cranial nerves II through XII are intact. Muscle strength 5/5 in all extremities. Sensation intact. Gait not checked.  PSYCHIATRIC: The patient is alert and oriented x 3.  SKIN: No obvious rash, lesion, or ulcer.   ORDERS/RESULTS REVIEWED:   CBC  Recent Labs Lab 03/18/17 0929 03/19/17 0348  WBC 4.6 6.6  HGB 9.7* 10.3*  HCT 28.8* 29.9*  PLT 234 248  MCV 88.0 88.5  MCH 29.7 30.5  MCHC 33.7 34.4  RDW 14.7* 14.9*  LYMPHSABS 1.3  --   MONOABS 0.7  --   EOSABS 0.1  --   BASOSABS 0.1  --    ------------------------------------------------------------------------------------------------------------------  Chemistries   Recent Labs Lab 03/18/17 0929  NA 137  K 4.0  CL 103  CO2 29  GLUCOSE 93  BUN 23*  CREATININE 0.56  CALCIUM 8.5*  AST 28  ALT 21  ALKPHOS 66  BILITOT 0.5   ------------------------------------------------------------------------------------------------------------------ estimated creatinine clearance is 44.1 mL/min (by C-G formula based on SCr of 0.56 mg/dL). ------------------------------------------------------------------------------------------------------------------ No results for input(s): TSH, T4TOTAL, T3FREE, THYROIDAB in the last 72 hours.  Invalid input(s): FREET3  Cardiac Enzymes  Recent Labs Lab 03/18/17 0929  TROPONINI <0.03    ------------------------------------------------------------------------------------------------------------------ Invalid input(s): POCBNP ---------------------------------------------------------------------------------------------------------------  RADIOLOGY: Ct Head Wo Contrast  Result Date: 03/18/2017 CLINICAL DATA:  83 year old female found down at home. Fell off toilet. Struck head and chest during the fall. Pain. EXAM: CT HEAD WITHOUT CONTRAST CT CERVICAL SPINE WITHOUT CONTRAST TECHNIQUE: Multidetector CT imaging of the head and cervical spine was performed following the standard protocol without intravenous contrast. Multiplanar CT image reconstructions of the cervical spine were also generated. COMPARISON:  Head CT without contrast 09/14/2015. FINDINGS: CT HEAD FINDINGS Brain: Cerebral volume is not significantly changed since 2016. Chronic Patchy and confluent periventricular white matter hypodensity. Some involvement of the deep white matter capsules. Small chronic lacunar infarct of the posterior right cerebellum is stable. No midline shift, ventriculomegaly, mass effect, evidence of mass lesion, intracranial hemorrhage or evidence of cortically based acute infarction. Vascular: Calcified atherosclerosis at the skull base. Skull: Stable and intact. Sinuses/Orbits: Previous paranasal sinus surgery. Increased nasal cavity and sphenoid mucosal thickening compared to 2016. Tympanic cavities and mastoids remain clear. Other: Broad-based left anterior convexity scalp hematoma measures up to 5 mm 6 mm in thickness. Underlying left frontal bone is intact. No other acute scalp or orbits soft tissue findings. CT CERVICAL SPINE FINDINGS Alignment: Straightening and mild reversal of cervical lordosis. Mild degenerative appearing anterolisthesis of C5 on C6 and C6 on C7. Trace anterolisthesis at C7-T1 and T1-T2 also appears degenerative. Skull base and vertebrae: Intact skullbase. Osteopenia. No acute  cervical spine fracture identified. Soft tissues and spinal canal: No prevertebral fluid or swelling. No visible canal hematoma. 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 Subsequent severe C1 C2 joint degeneration with complete joint space loss, extensive subchondral cystic change, sclerosis, and osteophytosis. Associated degenerative ligamentous hypertrophy resulting in spinal stenosis at the cervicomedullary junction level (series 7, image 22). Severe disc, endplate and posterior element degeneration at C5-C6 and C6-C7. Mild degenerative spinal stenosis suspected at C6-C7. Upper chest: Mild levoconvex upper thoracic scoliosis. Visible thoracic levels appear intact. Apical lung scarring. IMPRESSION: 1. Left anterior scalp hematoma without underlying fracture. 2. No acute intracranial abnormality. Stable probable chronic small vessel disease changes in the bilateral cerebral white matter and right cerebellum. 3.  No acute fracture or listhesis identified in the cervical spine. 4. 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 Subsequent very severe spinal degeneration at C1 C2, C5-C6 and C6-C7. Associated degenerative spinal stenosis at the cervicomedullary junction and C6-C7. Electronically Signed   By: Odessa Fleming M.D.   On: 03/18/2017 09:27   Ct Chest Wo Contrast  Result Date: 03/18/2017 CLINICAL DATA:  83 year old female found down at home. Fell off toilet. Struck head and chest during the fall. Pain. EXAM: CT CHEST WITHOUT CONTRAST TECHNIQUE: Multidetector CT imaging of the chest was performed following the standard protocol without IV contrast. COMPARISON:  Cervical spine CT from today reported separately. Chest CT 07/09/2014. FINDINGS: Cardiovascular: Calcified coronary artery atherosclerosis. No cardiomegaly or pericardial effusion. Mild tortuosity of the thoracic aorta. Mild calcified plaque in the upper abdominal aorta. Vascular patency is not evaluated  in the absence of IV contrast. Mediastinum/Nodes: Negative noncontrast appearance of the mediastinum. No retrosternal or mediastinal hematoma. Lungs/Pleura: Major airways are patent. Lower lung volumes today compared to 2015, with mild atelectasis. Chronic apical lung scarring appears stable. No pneumothorax. No pleural effusion. No pulmonary contusion or acute pulmonary opacity identified aside from atelectasis. Upper Abdomen: Negative noncontrast visible liver, spleen, pancreas, adrenal glands, left kidney, and splenic flexure (retained stool). Partially visible postoperative changes along the gastrohepatic ligament. Musculoskeletal: Subacute right anterolateral third and fourth rib fractures  with periosteal new bone formation. Superimposed acute right anterolateral fifth rib fracture (series 3, image 101). There might be an associated nondisplaced right anterior sixth rib fracture (series 3, image 122). Chronic right lateral third and fourth rib fractures are stable since 2015. No other rib fracture identified. Nondisplaced fracture through the lower third of the sternum which is new since 2015 and appears acute. Chronic levoconvex upper thoracic scoliosis. Stable thoracic vertebral height and alignment since 2015. No vertebral fracture identified. IMPRESSION: 1. Acute non to minimally displaced fractures of the anterior right 5th and 6th ribs. 2. Acute minimally displaced fracture through the lower third of the sternum. 3. No other acute traumatic injury identified in the noncontrast chest. 4. Subacute/healing fractures of the right anterior third and fourth ribs, with underlying chronic fractures in the lateral segments of those same ribs. 5. Mild pulmonary scarring in atelectasis. 6. Calcified atherosclerosis of the coronary arteries and visible abdominal aorta. Electronically Signed   By: Odessa Fleming M.D.   On: 03/18/2017 09:35   Ct Cervical Spine Wo Contrast  Result Date: 03/18/2017 CLINICAL DATA:   83 year old female found down at home. Fell off toilet. Struck head and chest during the fall. Pain. EXAM: CT HEAD WITHOUT CONTRAST CT CERVICAL SPINE WITHOUT CONTRAST TECHNIQUE: Multidetector CT imaging of the head and cervical spine was performed following the standard protocol without intravenous contrast. Multiplanar CT image reconstructions of the cervical spine were also generated. COMPARISON:  Head CT without contrast 09/14/2015. FINDINGS: CT HEAD FINDINGS Brain: Cerebral volume is not significantly changed since 2016. Chronic Patchy and confluent periventricular white matter hypodensity. Some involvement of the deep white matter capsules. Small chronic lacunar infarct of the posterior right cerebellum is stable. No midline shift, ventriculomegaly, mass effect, evidence of mass lesion, intracranial hemorrhage or evidence of cortically based acute infarction. Vascular: Calcified atherosclerosis at the skull base. Skull: Stable and intact. Sinuses/Orbits: Previous paranasal sinus surgery. Increased nasal cavity and sphenoid mucosal thickening compared to 2016. Tympanic cavities and mastoids remain clear. Other: Broad-based left anterior convexity scalp hematoma measures up to 5 mm 6 mm in thickness. Underlying left frontal bone is intact. No other acute scalp or orbits soft tissue findings. CT CERVICAL SPINE FINDINGS Alignment: Straightening and mild reversal of cervical lordosis. Mild degenerative appearing anterolisthesis of C5 on C6 and C6 on C7. Trace anterolisthesis at C7-T1 and T1-T2 also appears degenerative. Skull base and vertebrae: Intact skullbase. Osteopenia. No acute cervical spine fracture identified. Soft tissues and spinal canal: No prevertebral fluid or swelling. No visible canal hematoma. 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 Subsequent severe C1 C2 joint degeneration with complete joint space loss, extensive subchondral cystic change, sclerosis, and  osteophytosis. Associated degenerative ligamentous hypertrophy resulting in spinal stenosis at the cervicomedullary junction level (series 7, image 22). Severe disc, endplate and posterior element degeneration at C5-C6 and C6-C7. Mild degenerative spinal stenosis suspected at C6-C7. Upper chest: Mild levoconvex upper thoracic scoliosis. Visible thoracic levels appear intact. Apical lung scarring. IMPRESSION: 1. Left anterior scalp hematoma without underlying fracture. 2. No acute intracranial abnormality. Stable probable chronic small vessel disease changes in the bilateral cerebral white matter and right cerebellum. 3.  No acute fracture or listhesis identified in the cervical spine. 4. 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 Subsequent very severe spinal degeneration at C1 C2, C5-C6 and C6-C7. Associated degenerative spinal stenosis at the cervicomedullary junction and C6-C7. Electronically Signed   By: Rexene Edison  Margo Aye M.D.   On: 03/18/2017 09:27   Dg Chest Port 1 View  Result Date: 03/18/2017 CLINICAL DATA:  Fall EXAM: PORTABLE CHEST 1 VIEW COMPARISON:  None. FINDINGS: There are skin folds towards the left apex. No obvious pneumothorax. Upper normal heart size. Chronic interstitial changes but no consolidation or lung mass. No pleural effusion. Normal vascularity. IMPRESSION: No active disease. Electronically Signed   By: Jolaine Click M.D.   On: 03/18/2017 09:18    EKG:  Orders placed or performed during the hospital encounter of 03/18/17  . ED EKG  . ED EKG  . EKG 12-Lead  . EKG 12-Lead    ASSESSMENT AND PLAN:  Active Problems:   Rib fractures #1 right anterior fifth, sixth rib fractures, lower third sternum fracture, continue pain medications, get physical therapist involved for further recommendations, initiate incentive spirometry #2. Anemia. Follow with therapy, get Hemoccult #3 fall, generalized weakness, getting physical therapist involved for further  recommendations, possibly home health services upon discharge #4. Hypothyroidism, continue Synthroid   Management plans discussed with the patient, family and they are in agreement.   DRUG ALLERGIES:  Allergies  Allergen Reactions  . Citalopram Other (See Comments)    Caused bad dreams  . Sulfa Antibiotics Rash  . Tetracycline Rash    CODE STATUS:     Code Status Orders        Start     Ordered   03/18/17 1341  Full code  Continuous     03/18/17 1340    Code Status History    Date Active Date Inactive Code Status Order ID Comments User Context   This patient has a current code status but no historical code status.    Advance Directive Documentation     Most Recent Value  Type of Advance Directive  Living will  Pre-existing out of facility DNR order (yellow form or pink MOST form)  -  "MOST" Form in Place?  -      TOTAL TIME TAKING CARE OF THIS PATIENT: 30 minutes.    Katharina Caper M.D on 03/19/2017 at 10:32 AM  Between 7am to 6pm - Pager - [phone]  After 6pm go to [email or web address] - password [email or web address] [facility]  [facility]  Office  [phone]  CC: Primary care physician; Marisue Ivan, MD

## 2017-03-19 NOTE — Care Management (Signed)
RNCM received call from patient's daughter Roddie McMarlene stating that her father still does not recall name of agency but since it came from [facility] [facility] he thought it was [facility]. I [facility] with C[facility] at [facility] and they did not provide home health. I later learned it was [facility] that provided service. I have updated Roddie McMarlene and she spoke with patient and patient's husband- they then thought of the PT's name (Ruby?) and "want that agency" so they will call RNCM back with update.

## 2017-03-19 NOTE — Care Management (Signed)
RNCM received call back from [facility] stating it was [facility]. Referral to Clydie BraunKaren with [facility] home [facility].

## 2017-03-19 NOTE — Progress Notes (Signed)
Patient has been resting well with no issues. Woke up this morning with complaint of pain of 10/10 on the pain scale at multiple sites. Hydrocodone 5/325 mg 1 tab PRN administered. Will continue to monitor.

## 2017-03-19 NOTE — Care Management Note (Signed)
Case Management Note  Patient Details  Name: Suzanne Russo MRN: [id number] Date of Birth: [birth year]  Subjective/Objective:                  Spoke with patient's daughter Roddie McMarlene. She states that patient has used home health before but will need to ask patient's husband name of agency. Patient is independent from home with husband however per [facility] Ms[facility]arlene patient should use walker more than she does.  Her PCP is Dr. Burnadette PopLinthavong. Roddie McMarlene will call RNCM with Home health agency preference.  Action/Plan: RNCM to continue to follow.   Expected Discharge Date:                  Expected Discharge Plan:     In-House Referral:     Discharge planning Services  CM Consult  Post Acute Care Choice:  Home Health Choice offered to:  Adult Children  DME Arranged:    DME Agency:     HH Arranged:    HH Agency:     Status of Service:  In process, will continue to follow  If discussed at Long Length of Stay Meetings, dates discussed:    Additional Comments:  Collie Siadngela Jordin Vicencio, RN 03/19/2017, 1:07 PM

## 2017-03-19 NOTE — Evaluation (Signed)
Occupational Therapy Evaluation Patient Details Name: Suzanne Russo MRN: [id number] DOB: Mar 13, 1932 Today's Date: 03/19/2017    History of Present Illness presented to ER secondary to fall in bathroom with R lower rib-cage pain; admitted with R 5-6th rib fractures and distal 1/3 of sternum fracture.    Clinical Impression   Pt seen for OT evaluation this date. Pt presents with significant pain in R ribs and sternum 2:2 recent fall. Pt on BSC upon OT's arrival, generally min guard with verbal cues for hand placement and using RW to perform sit to stand transfer from [facility] back to EOB. Required Min assist for BLE mgt due to pain when getting back in bed. Pt/spouse/daughter educated in energy conservation strategies, falls prevention, UI management, home/routines modifications to minimize falls risk and maximize quality of life and functional independence. Pt used to enjoy gardening with her spouse and has been unable to do this. Pt also endorses multiple falls (about 1x/mo per daughter) due to turning too quickly, rushing, or loss of balance (tends to keep feet together limiting her base of support). Laundry and freezer in basement. Pt will benefit from skilled OT services to address noted impairments in order to maximize return to PLOF.     Follow Up Recommendations  Home health OT    Equipment Recommendations  Toilet rise with handles;Other (comment) Lexicographer)    Recommendations for Other Services       Precautions / Restrictions Precautions Precautions: Fall Restrictions Weight Bearing Restrictions: No      Mobility Bed Mobility Overal bed mobility: Needs Assistance Bed Mobility: Sit to Supine     Supine to sit: Min assist     General bed mobility comments: min assist for BLE mgt back to bed, due to pain  Transfers Overall transfer level: Needs assistance Equipment used: Rolling walker (2 wheeled) Transfers: Sit to/from Stand Sit to Stand: Min guard         General  transfer comment: VC for hand placement (single vs bilat UEs)    Balance Overall balance assessment: Needs assistance Sitting-balance support: No upper extremity supported;Feet supported Sitting balance-Leahy Scale: Good     Standing balance support: Bilateral upper extremity supported Standing balance-Leahy Scale: Fair                             ADL either performed or assessed with clinical judgement   ADL Overall ADL's : Needs assistance/impaired Eating/Feeding: Bed level;Set up   Grooming: Sitting;Set up;Wash/dry hands   Upper Body Bathing: Moderate assistance;Sitting;Minimal assistance   Lower Body Bathing: Moderate assistance;Sitting/lateral leans   Upper Body Dressing : Minimal assistance;Sitting   Lower Body Dressing: Moderate assistance;Sit to/from stand   Toilet Transfer: BSC;Ambulation;RW;Min guard Statistician Details (indicate cue type and reason): VCs for hand placement to minimize pressure/strain on sternum and maximize safety Toileting- Clothing Manipulation and Hygiene: Sitting/lateral lean;Set up;Supervision/safety         General ADL Comments: pt generally min-mod assist for ADL     Vision Baseline Vision/History: Wears glasses Wears Glasses: At all times Patient Visual Report: No change from baseline Vision Assessment?: No apparent visual deficits     Perception     Praxis      Pertinent Vitals/Pain Pain Assessment: 0-10 Pain Score: 10-Worst pain ever Pain Location: R ribs, chest during [facility] toileting task and functional mobility Pain Descriptors / Indicators: Aching;Grimacing;Guarding Pain Intervention(s): Limited activity within patient's tolerance;Monitored during session;RN gave pain meds during session;Repositioned  Hand Dominance Right   Extremity/Trunk Assessment Upper Extremity Assessment Upper Extremity Assessment: Overall WFL for tasks assessed   Lower Extremity Assessment Lower Extremity Assessment: Overall  WFL for tasks assessed   Cervical / Trunk Assessment Cervical / Trunk Assessment: Kyphotic   Communication Communication Communication: No difficulties   Cognition Arousal/Alertness: Awake/alert Behavior During Therapy: WFL for tasks assessed/performed Overall Cognitive Status: Within Functional Limits for tasks assessed                                     General Comments       Exercises Other Exercises Other Exercises: pt/family educated in falls prevention, home/routines modifications to support safety, energy conservation strategies, UI mgt; pt/family verbalized understanding    Shoulder Instructions      Home Living Family/patient expects to be discharged to:: Private residence Living Arrangements: Spouse/significant other Available Help at Discharge: Family (spouse able to provide limited physical assist) Type of Home: House Home Access: Stairs to enter [employer] CorporationEntrance Stairs-Number of Steps: 2 Entrance Stairs-Rails: Left;Right Home Layout: Laundry or work area in basement;One level     Bathroom Shower/Tub: Tub/shower unit;Walk-in shower (tub/shower with tub rail; using walk in shower)   Bathroom Toilet: Handicapped height Bathroom Accessibility: Yes How Accessible: Accessible via walker Home Equipment: Walker - 2 wheels;Walker - 4 wheels;Cane - single point;Bedside commode;Hand held shower head;Shower seat          Prior Functioning/Environment Level of Independence: Independent with assistive device(s)        Comments: Mod indep for ADLs, household activities with intermittent use of rollator; SPC outside of the home.  Does endorse multiple fall history - most often due to keeping feet together when standing, turning too quickly/rushing. Pt also endorses becoming dizzy/lightheaded with significant body positioning changes at times        OT Problem List: Pain;Impaired UE functional use;Impaired balance (sitting and/or standing);Decreased knowledge  of use of DME or AE;Decreased activity tolerance      OT Treatment/Interventions: Self-care/ADL training;Therapeutic exercise;Therapeutic activities;Energy conservation;DME and/or AE instruction;Patient/family education    OT Goals(Current goals can be found in the care plan section) Acute Rehab OT Goals Patient Stated Goal: to return home OT Goal Formulation: With patient/family Time For Goal Achievement: 04/02/17 Potential to Achieve Goals: Good  OT Frequency: Min 1X/week   Barriers to D/C:            Co-evaluation              AM-PAC PT "6 Clicks" Daily Activity     Outcome Measure Help from another person eating meals?: None Help from another person taking care of personal grooming?: None Help from another person toileting, which includes using toliet, bedpan, or urinal?: A Little Help from another person bathing (including washing, rinsing, drying)?: A Lot Help from another person to put on and taking off regular upper body clothing?: A Little Help from another person to put on and taking off regular lower body clothing?: A Lot 6 Click Score: 18   End of Session Equipment Utilized During Treatment: Gait belt;Rolling walker  Activity Tolerance: Patient tolerated treatment well Patient left: in bed;with call bell/phone within reach;with bed alarm set;with family/visitor present  OT Visit Diagnosis: Unsteadiness on feet (R26.81);Repeated falls (R29.6);Pain Pain - Right/Left: Right Pain - part of body:  (ribs, sternum)                Time: 4098-11911348-1449  OT Time Calculation (min): 61 min Charges:  OT General Charges $OT Visit: 1 Procedure OT Evaluation $OT Eval Low Complexity: 1 Procedure OT Treatments $Self Care/Home Management : 38-52 mins G-Codes: OT G-codes **NOT FOR INPATIENT CLASS** Functional Assessment Tool Used: AM-PAC 6 Clicks Daily Activity;Clinical judgement Functional Limitation: Self care Self Care Current Status (Z6109): At least 40 percent but less  than 60 percent impaired, limited or restricted Self Care Goal Status (U0454): At least 20 percent but less than 40 percent impaired, limited or restricted   Richrd Prime, MPH, MS, OTR/L ascom [phone] 03/19/17, 3:21 PM

## 2017-03-19 NOTE — Progress Notes (Signed)
CH made a follow up with Pt who wanted to complete an Advanced Directive. [place] met Pt and daughter who was at the bedside. [place] educated the Pt and her daughter about the AD, Pt asked [place] to comeback later and talk to her and husband. Pt was in pain the time when the [facility] visited with her. CH offered prayers for Pt and family.    03/19/17 1300  Clinical Encounter Type  Visited With Patient;Patient and family together  Visit Type Initial;Follow-up;Other (Comment)  Referral From Chaplain  Consult/Referral To Chaplain  Spiritual Encounters  Spiritual Needs Literature;Other (Comment)

## 2017-03-20 DIAGNOSIS — E039 Hypothyroidism, unspecified: Secondary | ICD-10-CM

## 2017-03-20 DIAGNOSIS — R0781 Pleurodynia: Secondary | ICD-10-CM

## 2017-03-20 DIAGNOSIS — D649 Anemia, unspecified: Secondary | ICD-10-CM

## 2017-03-20 DIAGNOSIS — R531 Weakness: Secondary | ICD-10-CM

## 2017-03-20 DIAGNOSIS — S2222XA Fracture of body of sternum, initial encounter for closed fracture: Secondary | ICD-10-CM

## 2017-03-20 LAB — URINALYSIS, COMPLETE (UACMP) WITH MICROSCOPIC
BACTERIA UA: NONE SEEN
Bilirubin Urine: NEGATIVE
GLUCOSE, UA: NEGATIVE mg/dL
Hgb urine dipstick: NEGATIVE
Ketones, ur: NEGATIVE mg/dL
Leukocytes, UA: NEGATIVE
NITRITE: NEGATIVE
PROTEIN: NEGATIVE mg/dL
SPECIFIC GRAVITY, URINE: 1.009 (ref 1.005–1.030)
Squamous Epithelial / LPF: NONE SEEN
pH: 7 (ref 5.0–8.0)

## 2017-03-20 MED ORDER — OXYCODONE HCL 5 MG PO TABS: 5.0000 mg | ORAL_TABLET | ORAL | 0 refills | Status: DC | PRN

## 2017-03-20 MED ORDER — LIDOCAINE-PRILOCAINE 2.5-2.5 % EX CREA: 1.0000 "application " | TOPICAL_CREAM | CUTANEOUS | 0 refills | Status: DC | PRN

## 2017-03-20 MED ORDER — LIDOCAINE 5 % EX PTCH: 2.0000 | MEDICATED_PATCH | CUTANEOUS | 0 refills | Status: DC

## 2017-03-20 MED ORDER — LIDOCAINE 5 % EX PTCH
2.0000 | MEDICATED_PATCH | CUTANEOUS | Status: DC
  Administered 2017-03-20: 2 via TRANSDERMAL
  Filled 2017-03-20: qty 2

## 2017-03-20 MED ORDER — BIOTIN 300 MCG PO TABS: 1.0000 | ORAL_TABLET | Freq: Every day | ORAL | 3 refills | Status: DC

## 2017-03-20 MED ORDER — TRAMADOL HCL 50 MG PO TABS: 50.0000 mg | ORAL_TABLET | Freq: Four times a day (QID) | ORAL | 0 refills | Status: DC | PRN

## 2017-03-20 NOTE — Clinical Social Work Placement (Signed)
   CLINICAL SOCIAL WORK PLACEMENT  NOTE  Date:  03/20/2017  Patient Details  Name: Suzanne Russo MRN: [id number] Date of Birth: 06-30-1932  Clinical Social Work is seeking post-discharge placement for this patient at the Skilled  Nursing Facility level of care (*CSW will initial, date and re-position this form in  chart as items are completed):  Yes   Patient/family provided with [place] Clinical Social Work Department's list of facilities offering this level of care within the geographic area requested by the patient (or if unable, by the patient's family).  Yes   Patient/family informed of their freedom to choose among providers that offer the needed level of care, that participate in Medicare, Medicaid or managed care program needed by the patient, have an available bed and are willing to accept the patient.  Yes   Patient/family informed of [facility]'s ownership interest in [facility] and [facility], as well as of the fact that they are under no obligation to receive care at these facilities.  PASRR submitted to EDS on       PASRR number received on       Existing PASRR number confirmed on 03/20/17     FL2 transmitted to all facilities in geographic area requested by pt/family on 03/20/17     FL2 transmitted to all facilities within larger geographic area on       Patient informed that his/her managed care company has contracts with or will negotiate with certain facilities, including the following:        Yes   Patient/family informed of bed offers received.  Patient chooses bed at  [name] )     Physician recommends and patient chooses bed at      Patient to be transferred to  [name] ) on 03/20/17.  Patient to be transferred to facility by  [facility] )     Patient family notified on 03/20/17 of transfer.  Name of family member notified:   (Patient's [name] is at bedside and aware of D/C today. )     PHYSICIAN        Additional Comment:    _______________________________________________ Kaelani Kendrick, Darleen CrockerBailey M, LCSW 03/20/2017, 3:59 PM

## 2017-03-20 NOTE — Care Management (Signed)
[facility] notified of discharge to [facility] today

## 2017-03-20 NOTE — Progress Notes (Signed)
Patient is alert and oriented and able to verbalize needs. Medicated for 7/10 pain in right ribcage and sternum. Husband and daughter at bedside. Vital signs stable. PIV removed. Discharge instructions gone over with patient and family at this time. Patient will be transported to [facility] by EMS. No concerns voiced at this time. Awaiting arrival of EMS.

## 2017-03-20 NOTE — Progress Notes (Signed)
Physical Therapy Treatment Patient Details Name: Suzanne Russo T Bucio MRN: [id number] DOB: 25-Jul-1932 Today's Date: 03/20/2017    History of Present Illness presented to ER secondary to fall in bathroom with R lower rib-cage pain; admitted with R 5-6th rib fractures and distal 1/3 of sternum fracture.     PT Comments    Pt in bed agrees to therapy.  To edge of bed with increased time and min a x 1.  Ambulated to bathroom to void.  She then continued to ambulate 7170' with walker and min guard/assist.  Overall increased ambulation distances today but remains limited by pain.    Discussed discharge plan with daughter.  She expressed concerns over frequent falls and her fathers inability to assist with her mobility.  Encouraged +1 assist for all mobility at this time.  She stated family will be staying with them for a while to assist.   Follow Up Recommendations  Home health PT;Supervision for mobility/OOB     Equipment Recommendations       Recommendations for Other Services       Precautions / Restrictions Precautions Precautions: Fall Restrictions Weight Bearing Restrictions: No    Mobility  Bed Mobility Overal bed mobility: Needs Assistance Bed Mobility: Supine to Sit;Sit to Supine     Supine to sit: Min assist Sit to supine: Min assist   General bed mobility comments: min assist with increased time  Transfers Overall transfer level: Needs assistance Equipment used: Rolling walker (2 wheeled) Transfers: Sit to/from Stand Sit to Stand: Min guard         General transfer comment: VC for hand placement   Ambulation/Gait Ambulation/Gait assistance: Min assist;Min guard Ambulation Distance (Feet): 70 Feet Assistive device: Rolling walker (2 wheeled) Gait Pattern/deviations: Step-through pattern;Decreased step length - right;Decreased step length - left   Gait velocity interpretation: <1.8 ft/sec, indicative of risk for recurrent falls General Gait Details: narrow BOS,  no LOB's, slow and guarded gait.     Stairs            Wheelchair Mobility    Modified Rankin (Stroke Patients Only)       Balance Overall balance assessment: Needs assistance Sitting-balance support: No upper extremity supported;Feet supported Sitting balance-Leahy Scale: Good     Standing balance support: Bilateral upper extremity supported Standing balance-Leahy Scale: Fair                              Cognition Arousal/Alertness: Awake/alert Behavior During Therapy: WFL for tasks assessed/performed Overall Cognitive Status: Within Functional Limits for tasks assessed                                        Exercises Other Exercises Other Exercises: to commode with hesitance sitting due to low height "Mine is higher at home"    General Comments        Pertinent Vitals/Pain Pain Assessment: 0-10 Pain Score: 10-Worst pain ever Pain Location: R ribs, chest durig mobility and sitting Pain Descriptors / Indicators: Aching;Grimacing;Guarding Pain Intervention(s): Limited activity within patient's tolerance;Monitored during session    Home Living                      Prior Function            PT Goals (current goals can now be found in the care plan  section)      Frequency    7X/week      PT Plan Current plan remains appropriate    Co-evaluation              AM-PAC PT "6 Clicks" Daily Activity  Outcome Measure  Difficulty turning over in bed (including adjusting bedclothes, sheets and blankets)?: Total Difficulty moving from lying on back to sitting on the side of the bed? : Total Difficulty sitting down on and standing up from a chair with arms (e.g., wheelchair, bedside commode, etc,.)?: Total Help needed moving to and from a bed to chair (including a wheelchair)?: Total Help needed walking in hospital room?: Total Help needed climbing 3-5 steps with a railing? : Total 6 Click Score: 6    End of  Session Equipment Utilized During Treatment: Gait belt Activity Tolerance: Patient tolerated treatment well;Patient limited by pain Patient left: in bed;with bed alarm set;with call bell/phone within reach;with family/visitor present Nurse Communication: Mobility status;Other (comment)       Time: 9604-5409 PT Time Calculation (min) (ACUTE ONLY): 25 min  Charges:  $Gait Training: 8-22 mins $Therapeutic Activity: 8-22 mins                    G Codes:       Danielle Dess, PTA 03/20/17, 10:31 AM

## 2017-03-20 NOTE — Clinical Social Work Note (Signed)
Clinical Social Work Assessment  Patient Details  Name: Suzanne Russo MRN: [id number] Date of Birth: 15-Oct-1931  Date of referral:  03/20/17               Reason for consult:  Facility Placement                Permission sought to share information with:  Chartered certified accountant granted to share information::  Yes, Verbal Permission Granted  Name::      [name]::   [name]  Relationship::     Contact Information:     Housing/Transportation Living arrangements for the past 2 months:  Single Family Home Source of Information:  Patient, Adult Children, Spouse Patient Interpreter Needed:  None Criminal Activity/Legal Involvement Pertinent to Current Situation/Hospitalization:  No - Comment as needed Significant Relationships:  Adult Children, Spouse Lives with:  Spouse Do you feel safe going back to the place where you live?  Yes Need for family participation in patient care:  Yes (Comment)  Care giving concerns:  Patient lives in [place] with her [name].    Social Worker assessment / plan:  Holiday representative (CSW) received SNF consult from MD. PT is recommending home health. CSW met with patient and her [name], daughter Suzanne Russo and son in law were at bedside. Patient was alert and oriented X4. Patient reported that she would like to discuss her D/C options. CSW explained that PT is recommending home health and her insurance [employer] will not pay for short term rehab at a SNF. Patient and family reported that they are not interested in PT but would like to look into respite care. CSW explained private pay respite care option at SNF or home health PT with private caregivers at home. CSW explained that home health would be covered by insurance but SNF and caregivers would be an out of pocket expense. Patient and family agreed to private pay for SNF and prefer [facility]. FL2 complete and faxed out to [employer].   [facility] admissions coordinator at [facility] came to visit patient and family today and discussed private pay rates. Patient and family accepted bed offer from [facility]. Patient is medically stable for D/C to [facility] today. Per Magda Paganini patient can come today to room 503. RN will call report and arrange EMS for transport. CSW sent D/C orders to [facility] via [place]. Patient and her [name] and daughter Suzanne Russo are aware of above. Please reconsult if future social work needs arise. CSW signing off.   Employment status:  Retired Nurse, adult PT Recommendations:  Home with [place] / Referral to community resources:  [place]  Patient/Family's Response to care:  Patient is agreeable to private pay for respite care at [facility].   Patient/Family's Understanding of and Emotional Response to Diagnosis, Current Treatment, and Prognosis:  Patient and her family were very pleasant and thanked CSW for assistance.   Emotional Assessment Appearance:  Appears stated age Attitude/Demeanor/Rapport:    Affect (typically observed):  Accepting, Adaptable, Pleasant Orientation:  Oriented to Self, Oriented to Place, Oriented to  Time, Oriented to Situation Alcohol / Substance use:  Not Applicable Psych involvement (Current and /or in the community):  No (Comment)  Discharge Needs  Concerns to be addressed:  Discharge Planning Concerns Readmission within the last 30 days:  No Current discharge risk:  Dependent with Mobility Barriers to Discharge:  No  Barriers Identified   Garo Heidelberg, Veronia Beets, LCSW 03/20/2017, 4:00 PM

## 2017-03-20 NOTE — [place] FL2 (Signed)
[place] MEDICAID FL2 LEVEL OF CARE SCREENING TOOL     IDENTIFICATION  Patient Name: Suzanne Russo Birthdate: [birth year] Sex: female Admission Date (Current Location): 03/18/2017  [place]   Facility and Address:  [facility], [address], [place], [place] [address]      Provider Number: [id number]  Attending Physician Name and Address:  Katharina CaperVaickute, Megumi Treaster, MD  Relative Name and Phone Number:       Current Level of Care: Hospital Recommended Level of Care: Skilled Nursing Facility Prior Approval Number:    Date Approved/Denied:   PASRR Number:  ([id number] )  Discharge Plan: SNF    Current Diagnoses: Patient Active Problem List   Diagnosis Date Noted  . Closed fracture of body of sternum 03/20/2017  . Anemia 03/20/2017  . Generalized weakness 03/20/2017  . Pleuritic chest pain 03/20/2017  . Hypothyroidism 03/20/2017  . Rib fractures 03/18/2017  . Chronic venous insufficiency 03/14/2017  . Ankle ulcer, left, limited to breakdown of skin (HCC) 01/03/2017  . Cellulitis of lower extremity 11/14/2016  . Swelling of limb 11/14/2016  . Lymphedema 11/14/2016  . Headache disorder 08/26/2015  . Concussion with no loss of consciousness 08/26/2015  . Occipital neuralgia of left side 12/15/2014  . Memory difficulty 07/16/2014  . Trigeminal neuralgia 07/11/2013    Orientation RESPIRATION BLADDER Height & Weight     Self, Time, Situation, Place  Normal Continent Weight: 120 lb (54.4 kg) Height:  5\' 3"  (160 cm)  BEHAVIORAL SYMPTOMS/MOOD NEUROLOGICAL BOWEL NUTRITION STATUS   (none)  (none) Continent Diet (Diet: Low Sodium, Heart Healthy )  AMBULATORY STATUS COMMUNICATION OF NEEDS Skin   Limited Assist Verbally Other (Comment) (Venous Stasis Ulcer foot left, medial )                       Personal Care Assistance Level of Assistance  Bathing, Feeding, Dressing Bathing Assistance: Limited assistance Feeding  assistance: Independent Dressing Assistance: Limited assistance     Functional Limitations Info  Sight, Hearing, Speech Sight Info: Adequate Hearing Info: Impaired Speech Info: Adequate    SPECIAL CARE FACTORS FREQUENCY  PT (By licensed PT)     PT Frequency:  (4-5)              Contractures      Additional Factors Info  Code Status, Allergies Code Status Info:  (Full Code. ) Allergies Info:  ( Citalopram, Sulfa Antibiotics, Tetracycline)           Current Medications (03/20/2017):  This is the current hospital active medication list Current Facility-Administered Medications  Medication Dose Route Frequency Provider Last Rate Last Dose  . 0.9 %  sodium chloride infusion  250 mL Intravenous PRN Gracelyn NurseJohnston, John D, MD      . acetaminophen (TYLENOL) tablet 650 mg  650 mg Oral Q6H PRN Gracelyn NurseJohnston, John D, MD       Or  . acetaminophen (TYLENOL) suppository 650 mg  650 mg Rectal Q6H PRN Gracelyn NurseJohnston, John D, MD      . calcium-vitamin D (OSCAL WITH D) 500-200 MG-UNIT per tablet 1 tablet  1 tablet Oral BID Gracelyn NurseJohnston, John D, MD   1 tablet at 03/20/17 [phone]  . carbamazepine (TEGRETOL XR) 12 hr tablet 500 mg  500 mg Oral BID Gracelyn NurseJohnston, John D, MD   500 mg at 03/20/17 [phone]  . cyanocobalamin tablet 500 mcg  500 mcg Oral Daily Gracelyn NurseJohnston, John D, MD   500 mcg  at 03/20/17 0953  . gabapentin (NEURONTIN) capsule 300 mg  300 mg Oral BID Gracelyn Nurse, MD   300 mg at 03/20/17 0952  . heparin injection 5,000 Units  5,000 Units Subcutaneous Q8H Gracelyn Nurse, MD   5,000 Units at 03/20/17 [phone]  . levothyroxine (SYNTHROID, LEVOTHROID) tablet 100 mcg  100 mcg Oral QAC breakfast Gracelyn Nurse, MD   100 mcg at 03/20/17 0818  . lidocaine (LIDODERM) 5 % 2 patch  2 patch Transdermal Q24H Katharina Caper, MD   2 patch at 03/20/17 1135  . morphine 2 MG/ML injection 2 mg  2 mg Intravenous Q3H PRN Katharina Caper, MD   2 mg at 03/19/17 1409  . omega-3 acid ethyl esters (LOVAZA) capsule 1 g  1 g Oral Daily  Gracelyn Nurse, MD   1 g at 03/20/17 [phone]  . oxyCODONE (Oxy IR/ROXICODONE) immediate release tablet 5 mg  5 mg Oral Q4H PRN Katharina Caper, MD   5 mg at 03/20/17 1135  . pantoprazole (PROTONIX) EC tablet 40 mg  40 mg Oral QAC breakfast Gracelyn Nurse, MD   40 mg at 03/20/17 0818  . sodium chloride flush (NS) 0.9 % injection 3 mL  3 mL Intravenous Q12H Gracelyn Nurse, MD   3 mL at 03/20/17 0956  . sodium chloride flush (NS) 0.9 % injection 3 mL  3 mL Intravenous PRN Gracelyn Nurse, MD      . vitamin E capsule 400 Units  400 Units Oral Daily Gracelyn Nurse, MD   400 Units at 03/20/17 5409     Discharge Medications: Please see discharge summary for a list of discharge medications.  Relevant Imaging Results:  Relevant Lab Results:   Additional Information  (SSN: [id number])  Sample, Darleen Crocker, LCSW

## 2017-03-20 NOTE — Progress Notes (Signed)
Occupational Therapy Treatment Patient Details Name: Suzanne Russo MRN: [id number] DOB: July 03, 1932 Today's Date: 03/20/2017    History of present illness presented to ER secondary to fall in bathroom with R lower rib-cage pain; admitted with R 5-6th rib fractures and distal 1/3 of sternum fracture.    OT comments  Pt progressing towards goals. With family and spouse in room upon OT's arrival. Pt receiving medications upon OT's arrival. Recently to bathroom. Daughter states that they have chosen to go to STR at [facility] following this hospitalization to maximize safety and comfort while healing. Pt/family educated in energy conservation strategies and home/routines modifications to minimize falls risk and maximize safety/independence at home. Continue to progress towards OT goals.    Follow Up Recommendations  Home health OT    Equipment Recommendations  Toilet rise with handles    Recommendations for Other Services      Precautions / Restrictions Precautions Precautions: Fall Restrictions Weight Bearing Restrictions: No       Mobility Bed Mobility  Transfers    Balance                             ADL either performed or assessed with clinical judgement   ADL                                         General ADL Comments: pt educated in use of AE for LB dressing and pt verbalized understanding, states that she has access to both a reacher and sock aid at home but has not used     Vision Baseline Vision/History: Wears glasses Wears Glasses: At all times Patient Visual Report: No change from baseline     Perception     Praxis      Cognition Arousal/Alertness: Awake/alert Behavior During Therapy: WFL for tasks assessed/performed Overall Cognitive Status: Within Functional Limits for tasks assessed                                          Exercises Other Exercises Other Exercises: pt/spouse/daughter educated in  energy conservation strategies and home/routines modifications to maximize safety and functional independence at home; handout provided; all verbalized understanding Other Exercises: pt educated in adaptive utensils and options for medication storage due to pt's report of sometimes dropping utensils and difficulty with cutting during meal prep    Shoulder Instructions       General Comments      Pertinent Vitals/ Pain       Pain Assessment: Faces (occasional sharp pains in ribs/sternum but does not give a number) Pain Score: 10-Worst pain ever Faces Pain Scale: Hurts even more Pain Location: R ribs, sternum with movement Pain Descriptors / Indicators: Aching;Grimacing;Guarding Pain Intervention(s): Limited activity within patient's tolerance;Monitored during session;RN gave pain meds during session;Repositioned  Home Living                                          Prior Functioning/Environment              Frequency  Min 1X/week        Progress Toward Goals  OT Goals(current goals  can now be found in the care plan section)  Progress towards OT goals: Progressing toward goals  Acute Rehab OT Goals Patient Stated Goal: to return home OT Goal Formulation: With patient/family Time For Goal Achievement: 04/02/17 Potential to Achieve Goals: Good  Plan Frequency remains appropriate;Other (comment) (pt/family report decision to go to STR for a little prior to return home to maximize confidence and safety while healing)    Co-evaluation                 AM-PAC PT "6 Clicks" Daily Activity     Outcome Measure   Help from another person eating meals?: None Help from another person taking care of personal grooming?: None Help from another person toileting, which includes using toliet, bedpan, or urinal?: A Little Help from another person bathing (including washing, rinsing, drying)?: A Lot Help from another person to put on and taking off regular  upper body clothing?: A Little Help from another person to put on and taking off regular lower body clothing?: A Lot 6 Click Score: 18    End of Session    OT Visit Diagnosis: Unsteadiness on feet (R26.81);Repeated falls (R29.6);Pain Pain - Right/Left: Right Pain - part of body:  (ribs/sternum)   Activity Tolerance Patient tolerated treatment well   Patient Left in bed;with call bell/phone within reach;with bed alarm set;with family/visitor present   Nurse Communication      Functional Assessment Tool Used: AM-PAC 6 Clicks Daily Activity;Clinical judgement Functional Limitation: Self care Self Care Current Status (Z6109(G8987): At least 40 percent but less than 60 percent impaired, limited or restricted Self Care Goal Status (U0454(G8988): At least 20 percent but less than 40 percent impaired, limited or restricted   Time: 1132-1208 OT Time Calculation (min): 36 min  Charges: OT G-codes **NOT FOR INPATIENT CLASS** Functional Assessment Tool Used: AM-PAC 6 Clicks Daily Activity;Clinical judgement Functional Limitation: Self care Self Care Current Status (U9811(G8987): At least 40 percent but less than 60 percent impaired, limited or restricted Self Care Goal Status (B1478(G8988): At least 20 percent but less than 40 percent impaired, limited or restricted OT General Charges $OT Visit: 1 Procedure OT Treatments $Therapeutic Activity: 23-37 mins  Richrd PrimeJamie Stiller, MPH, MS, OTR/L ascom [phone] 03/20/17, 1:40 PM

## 2017-03-20 NOTE — Discharge Summary (Addendum)
[facility] - [place] [facility]   PATIENT NAME: Suzanne Russo    MR#:  [id number]  DATE OF BIRTH:  [birth year]  DATE OF ADMISSION:  03/18/2017 ADMITTING PHYSICIAN: Gracelyn Nurse, MD  DATE OF DISCHARGE: No discharge date for patient encounter.  PRIMARY CARE PHYSICIAN: Marisue Ivan, MD     ADMISSION DIAGNOSIS:  Fall, initial encounter [W19.XXXA] Closed fracture of multiple ribs of right side, initial encounter [S22.41XA] Closed fracture of sternum, unspecified portion of sternum, initial encounter [S22.20XA]  DISCHARGE DIAGNOSIS:  Active Problems:   Rib fractures   Closed fracture of body of sternum   Anemia   Generalized weakness   Pleuritic chest pain   Hypothyroidism   SECONDARY DIAGNOSIS:   Past Medical History:  Diagnosis Date  . Carpal tunnel syndrome   . Concussion with no loss of consciousness 08/26/2015  . Degenerative arthritis   . DVT (deep venous thrombosis) (HCC)   . GERD (gastroesophageal reflux disease)   . Hemorrhoids   . Hypothyroidism   . Memory difficulty 07/16/2014  . Occipital neuralgia   . Occipital neuralgia of left side 12/15/2014  . Osteoporosis   . Pelvic fracture (HCC)   . Peptic ulcer disease   . Right clavicle fracture   . Trigeminal neuralgia   . Venous insufficiency     .pro HOSPITAL COURSE:   The patient is a 83 year old Caucasian female with past medical history significant for history of DVT, hypothyroidism, pelvic fracture, trigeminal neuralgia, who presents to the hospital with complaints of fall, midsternal and right-sided chest pain, worsening with deep breathing. CT scan of the chest revealed right fifth, sixth rib fractures, lower third sternum fracture. The patient feels somewhat better today, pain is more tolerable, but still very uncomfortable, especially with inspirations. No cough or phlegm production. Patient's family was concerned about patient going home, they chose skilled nursing  facility placement for rehabilitation. Patient is being discharged to skilled nursing facility today Discussion by problem: #1 right anterior fifth, sixth rib fractures, lower third sternum fracture, continue pain medications, patient's family chose skilled nursing facility placement for rehabilitation. Despite physical therapist  Recommendations to go home with home health due to patient's fragile state, the patient is to continue pain medications and incentive spirometry #2. Anemia. Follow as outpatient, no bleeding was noted, Hemoccult was ordered, not obtained #3 fall, generalized weakness, physical therapist  recommended home health services upon discharge, patient's family chose skilled nursing facility for rehabilitation #4. Hypothyroidism, continue Synthroid DISCHARGE CONDITIONS:   Stable  CONSULTS OBTAINED:    DRUG ALLERGIES:   Allergies  Allergen Reactions  . Citalopram Other (See Comments)    Caused bad dreams  . Sulfa Antibiotics Rash  . Tetracycline Rash    DISCHARGE MEDICATIONS:   Current Discharge Medication List    START taking these medications   Details  lidocaine (LIDODERM) 5 % Place 2 patches onto the skin daily. Remove & Discard patch within 12 hours or as directed by MD Qty: 30 patch, Refills: 0    lidocaine-prilocaine (EMLA) cream Apply 1 application topically as needed. Qty: 30 g, Refills: 0    oxyCODONE (OXY IR/ROXICODONE) 5 MG immediate release tablet Take 1 tablet (5 mg total) by mouth every 4 (four) hours as needed for moderate pain, severe pain or breakthrough pain. Qty: 30 tablet, Refills: 0      CONTINUE these medications which have CHANGED   Details  Biotin 300 MCG TABS Take 1 tablet (300 mcg total)  by mouth daily. Qty: 30 tablet, Refills: 3    traMADol (ULTRAM) 50 MG tablet Take 1 tablet (50 mg total) by mouth every 6 (six) hours as needed (mild pain). Qty: 30 tablet, Refills: 0      CONTINUE these medications which have NOT CHANGED    Details  Calcium Carbonate-Vitamin D (CALCIUM + D PO) Take 1 tablet by mouth 2 (two) times daily.     !! carbamazepine (CARBATROL) 200 MG 12 hr capsule Take 1 capsule (200 mg total) by mouth 2 (two) times daily. Qty: 180 capsule, Refills: 3    !! carbamazepine (CARBATROL) 300 MG 12 hr capsule Take 1 capsule (300 mg total) by mouth 2 (two) times daily.    celecoxib (CELEBREX) 200 MG capsule Take 200 mg by mouth daily. Refills: 2    Cyanocobalamin (B-12 PO) Take 1 tablet by mouth daily.     gabapentin (NEURONTIN) 100 MG capsule Take 2 capsules (200 mg total) by mouth 2 (two) times daily.    hydrocortisone (ANUSOL-HC) 25 MG suppository Place 25 mg rectally as needed.    levothyroxine (SYNTHROID, LEVOTHROID) 100 MCG tablet Take 100 mcg by mouth daily before breakfast.    loratadine-pseudoephedrine (CLARITIN-D 24-HOUR) 10-240 MG 24 hr tablet Take 1 tablet by mouth daily.    meclizine (ANTIVERT) 25 MG tablet Take 25 mg by mouth every 6 (six) hours as needed.    Omega-3 Fatty Acids (FISH OIL) 1000 MG CAPS Take 1,000 mg by mouth daily.     omeprazole (PRILOSEC) 20 MG capsule Take 20 mg by mouth daily.    Potassium Gluconate 550 MG TABS Take 1 tablet by mouth every evening.    triamcinolone cream (KENALOG) 0.1 % Apply 1 application topically 2 (two) times daily. Compounded with eucerin    vitamin E 400 UNIT capsule Take 400 Units by mouth daily.     !! - Potential duplicate medications found. Please discuss with provider.    STOP taking these medications     hyoscyamine (LEVSIN, ANASPAZ) 0.125 MG tablet          DISCHARGE INSTRUCTIONS:    The patient is to follow-up with primary care physician within one week after discharge  If you experience worsening of your admission symptoms, develop shortness of breath, life threatening emergency, suicidal or homicidal thoughts you must seek medical attention immediately by calling 911 or calling your MD immediately  if symptoms less  severe.  You Must read complete instructions/literature along with all the possible adverse reactions/side effects for all the Medicines you take and that have been prescribed to you. Take any new Medicines after you have completely understood and accept all the possible adverse reactions/side effects.   Please note  You were cared for by a hospitalist during your hospital stay. If you have any questions about your discharge medications or the care you received while you were in the hospital after you are discharged, you can call the unit and asked to speak with the hospitalist on call if the hospitalist that took care of you is not available. Once you are discharged, your primary care physician will handle any further medical issues. Please note that NO REFILLS for any discharge medications will be authorized once you are discharged, as it is imperative that you return to your primary care physician (or establish a relationship with a primary care physician if you do not have one) for your aftercare needs so that they can reassess your need for medications and monitor your lab  values.    Today   CHIEF COMPLAINT:   Chief Complaint  Patient presents with  . Fall    HISTORY OF PRESENT ILLNESS:    VITAL SIGNS:  Blood pressure 135/62, pulse 61, temperature 98.8 F (37.1 C), temperature source Oral, resp. rate 18, height 5\' 3"  (1.6 m), weight 54.4 kg (120 lb), SpO2 95 %.  I/O:    Intake/Output Summary (Last 24 hours) at 03/20/17 1542 Last data filed at 03/20/17 1354  Gross per 24 hour  Intake              240 ml  Output              900 ml  Net             -660 ml    PHYSICAL EXAMINATION:  GENERAL:  83 y.o.-year-old patient lying in the bed with no acute distress.  EYES: Pupils equal, round, reactive to light and accommodation. No scleral icterus. Extraocular muscles intact.  HEENT: Head atraumatic, normocephalic. Oropharynx and nasopharynx clear.  NECK:  Supple, no jugular venous  distention. No thyroid enlargement, no tenderness.  LUNGS: Normal breath sounds bilaterally, no wheezing, rales,rhonchi or crepitation. No use of accessory muscles of respiration.  CARDIOVASCULAR: S1, S2 normal. No murmurs, rubs, or gallops.  ABDOMEN: Soft, non-tender, non-distended. Bowel sounds present. No organomegaly or mass.  EXTREMITIES: No pedal edema, cyanosis, or clubbing.  NEUROLOGIC: Cranial nerves II through XII are intact. Muscle strength 5/5 in all extremities. Sensation intact. Gait not checked.  PSYCHIATRIC: The patient is alert and oriented x 3.  SKIN: No obvious rash, lesion, or ulcer.   DATA REVIEW:   CBC  Recent Labs Lab 03/19/17 0348  WBC 6.6  HGB 10.3*  HCT 29.9*  PLT 248    Chemistries   Recent Labs Lab 03/18/17 0929  NA 137  K 4.0  CL 103  CO2 29  GLUCOSE 93  BUN 23*  CREATININE 0.56  CALCIUM 8.5*  AST 28  ALT 21  ALKPHOS 66  BILITOT 0.5    Cardiac Enzymes  Recent Labs Lab 03/18/17 0929  TROPONINI <0.03    Microbiology Results  No results found for this or any previous visit.  RADIOLOGY:  No results found.  EKG:   Orders placed or performed during the hospital encounter of 03/18/17  . ED EKG  . ED EKG  . EKG 12-Lead  . EKG 12-Lead      Management plans discussed with the patient, family and they are in agreement.  CODE STATUS:     Code Status Orders        Start     Ordered   03/18/17 1341  Full code  Continuous     03/18/17 1340    Code Status History    Date Active Date Inactive Code Status Order ID Comments User Context   This patient has a current code status but no historical code status.    Advance Directive Documentation     Most Recent Value  Type of Advance Directive  Living will  Pre-existing out of facility DNR order (yellow form or pink MOST form)  -  "MOST" Form in Place?  -      TOTAL TIME TAKING CARE OF THIS PATIENT: 40 minutes.    Katharina CaperVAICKUTE,Gicela Schwarting M.D on 03/20/2017 at 3:42 PM  Between  7am to 6pm - Pager - [phone]  After 6pm go to [email or web address] - password [email or web address]  [facility]  Office  [phone]  CC: Primary care physician; Marisue Ivan, MD

## 2017-03-20 NOTE — Progress Notes (Signed)
CH made a follow up visit with Pt. Pt was lying on bed with daughter and husband at her bedside. Pt's daughter and husband informed CH that Pt had an Advanced Directive on file and did not need to complete a new one. But they asked for a prayers to be offered for Pt as she was going to be moved to therapy facility. CH offered encouragement, prayers and ministry of presence.    03/20/17 1500  Clinical Encounter Type  Visited With Patient  Visit Type Follow-up;Spiritual support  Referral From Chaplain  Spiritual Encounters  Spiritual Needs Prayer;Other (Comment)

## 2017-03-20 NOTE — Progress Notes (Signed)
Called report to nurse Teresa at [name].  

## 2017-04-02 NOTE — Progress Notes (Signed)
Subjective:    Patient ID: Suzanne Russo, female    DOB: 1932/02/05, 83 y.o.   MRN: [id number] Chief Complaint  Patient presents with  . Follow-up    1 week   Patient last seen approximately 1 week ago. Patient has a history of bilateral lower extremity edema complicated with recurrent bouts of cellulitis. The patient is not compliant in wearing medical grade 1 compression stockings and elevating her legs. We agreed to wrap her bilateral lower extremity for one week in Unna wraps to gain control of the edema / cellulitis. The patient denies any fever, nausea or vomiting.     Review of Systems  Constitutional: Negative.   HENT: Negative.   Eyes: Negative.   Respiratory: Negative.   Cardiovascular: Positive for leg swelling.  Gastrointestinal: Negative.   Endocrine: Negative.   Genitourinary: Negative.   Musculoskeletal: Negative.   Skin: Negative.   Allergic/Immunologic: Negative.   Neurological: Negative.   Hematological: Negative.   Psychiatric/Behavioral: Negative.        Objective:   Physical Exam  Constitutional: She is oriented to person, place, and time. She appears well-developed and well-nourished. No distress.  HENT:  Head: Normocephalic and atraumatic.  Eyes: Conjunctivae are normal. Pupils are equal, round, and reactive to light.  Cardiovascular: Normal rate, regular rhythm, normal heart sounds and intact distal pulses.   Pulses:      Radial pulses are 2+ on the right side, and 2+ on the left side.       Dorsalis pedis pulses are 2+ on the right side, and 2+ on the left side.       Posterior tibial pulses are 2+ on the right side, and 2+ on the left side.  Pulmonary/Chest: Effort normal.  Musculoskeletal: She exhibits edema (Mild edema noted bilaterally).  Neurological: She is alert and oriented to person, place, and time.  Skin: Skin is warm and dry. She is not diaphoretic.  No cellulitis noted  Psychiatric: She has a normal mood and affect. Her behavior is  normal. Judgment and thought content normal.  Vitals reviewed.   BP 129/68   Pulse 60   Resp 16   Wt 118 lb 12.8 oz (53.9 kg)   BMI 20.39 kg/m   Past Medical History:  Diagnosis Date  . Carpal tunnel syndrome   . Concussion with no loss of consciousness 08/26/2015  . Degenerative arthritis   . DVT (deep venous thrombosis) (HCC)   . GERD (gastroesophageal reflux disease)   . Hemorrhoids   . Hypothyroidism   . Memory difficulty 07/16/2014  . Occipital neuralgia   . Occipital neuralgia of left side 12/15/2014  . Osteoporosis   . Pelvic fracture (HCC)   . Peptic ulcer disease   . Right clavicle fracture   . Trigeminal neuralgia   . Venous insufficiency     Social History   Social History  . Marital status: Married    Spouse name: N/A  . Number of children: 3  . Years of education: college   Occupational History  . Retired    Social History Main Topics  . Smoking status: Former Smoker    Packs/day: 1.00    Years: 45.00    Types: Cigarettes  . Smokeless tobacco: Never Used  . Alcohol use 4.2 oz/week    7 Glasses of wine per week     Comment: wine on occasion  . Drug use: No  . Sexual activity: Not on file   Other Topics Concern  .  Not on file   Social History Narrative   Patient is right handed.   Patient drinks 3 cups caffeine daily       Past Surgical History:  Procedure Laterality Date  . bilateral cataract surgery    . bilateral knee surgery    . left hip fracture    . left thumb surgery    . left toe surgery    . NASAL SINUS SURGERY    . TONSILLECTOMY    . VAGINAL HYSTERECTOMY      Family History  Problem Relation Age of Onset  . Heart disease Mother   . COPD Father   . Congestive Heart Failure Brother     Allergies  Allergen Reactions  . Citalopram Other (See Comments)    Caused bad dreams  . Sulfa Antibiotics Rash  . Tetracycline Rash       Assessment & Plan:  Patient last seen approximately 1 week ago. Patient has a history of  bilateral lower extremity edema complicated with recurrent bouts of cellulitis. The patient is not compliant in wearing medical grade 1 compression stockings and elevating her legs. We agreed to wrap her bilateral lower extremity for one week in Unna wraps to gain control of the edema / cellulitis. The patient denies any fever, nausea or vomiting.   1. Cellulitis of lower extremity, unspecified laterality - improvement There has been improvement in the patient's edema and cellulitis status post 1 week if bilateral lower extremity Unna wraps Again we had a long conversation about the importance of wearing her medical grade 1 compression stockings, elevating her legs heart level or higher and using her lymphedema pump at least once a day for an hour. Patient states that she will engage in these behaviors Patient has an upcoming appointment to undergo a repeat venous duplex of her lower extremity to assess anatomy and any changes in her venous disease We will transition the patient from Unna wraps to compression stockings  - VAS US LOWER EXTREMITY VENOUS REFLUX; Future  2. Swelling of limb  - improvement As above  3. Lymphedema - improvement As above   Current Outpatient Prescriptions on File Prior to Visit  Medication Sig Dispense Refill  . Calcium Carbonate-Vitamin D (CALCIUM + D PO) Take 1 tablet by mouth 2 (two) times daily.     . carbamazepine (CARBATROL) 200 MG 12 hr capsule Take 1 capsule (200 mg total) by mouth 2 (two) times daily. 180 capsule 3  . carbamazepine (CARBATROL) 300 MG 12 hr capsule Take 1 capsule (300 mg total) by mouth 2 (two) times daily.    . celecoxib (CELEBREX) 200 MG capsule Take 200 mg by mouth daily.  2  . Cyanocobalamin (B-12 PO) Take 1 tablet by mouth daily.     Marland Kitchen. gabapentin (NEURONTIN) 100 MG capsule Take 2 capsules (200 mg total) by mouth 2 (two) times daily. (Patient taking differently: Take 300 mg by mouth See admin instructions. tk 300mg  in the morning and  300mg  qhs)    . hydrocortisone (ANUSOL-HC) 25 MG suppository Place 25 mg rectally as needed.    Marland Kitchen. levothyroxine (SYNTHROID, LEVOTHROID) 100 MCG tablet Take 100 mcg by mouth daily before breakfast.    . loratadine-pseudoephedrine (CLARITIN-D 24-HOUR) 10-240 MG 24 hr tablet Take 1 tablet by mouth daily.    . meclizine (ANTIVERT) 25 MG tablet Take 25 mg by mouth every 6 (six) hours as needed.    . Omega-3 Fatty Acids (FISH OIL) 1000 MG CAPS Take 1,000 mg  by mouth daily.     Marland Kitchen omeprazole (PRILOSEC) 20 MG capsule Take 20 mg by mouth daily.    . vitamin E 400 UNIT capsule Take 400 Units by mouth daily.     No current facility-administered medications on file prior to visit.     There are no Patient Instructions on file for this visit. No Follow-up on file.   Thurma Priego A Alvilda Mckenna, PA-C

## 2017-05-07 ENCOUNTER — Other Ambulatory Visit: Payer: Self-pay | Admitting: Adult Health

## 2017-05-07 ENCOUNTER — Other Ambulatory Visit: Payer: Self-pay | Admitting: Neurology

## 2017-05-07 MED ORDER — CARBAMAZEPINE ER 300 MG PO CP12: 300.0000 mg | ORAL_CAPSULE | Freq: Two times a day (BID) | ORAL | 2 refills | Status: DC

## 2017-05-08 ENCOUNTER — Telehealth: Payer: Self-pay | Admitting: Neurology

## 2017-05-08 NOTE — Telephone Encounter (Signed)
[facility] stating the patient's husband called about a refill sent for carbatrol. Joni Reiningicole stated the pharmacy did not receive the refill. This RN gave her a verbal refill of Dr Clarisa KindredWillis's refill from 05/07/17. Joni ReiningNicole asked specifically if patient is also taking Carbatrol  200 mg. This RN read Dr Clarisa KindredWillis's phone note from 01/01/17 to confirm that the patient is to be taking total of Carbatrol 500 mg twice daily.  She takes one 300 mg capsule and one 200 mg capsule two times daily. Joni Reiningicole confirmed refill from yesterday and what patient is supposed to be taking. She stated she would call patient's husband.

## 2017-07-05 ENCOUNTER — Encounter: Payer: Self-pay | Admitting: Neurology

## 2017-07-05 ENCOUNTER — Ambulatory Visit (INDEPENDENT_AMBULATORY_CARE_PROVIDER_SITE_OTHER): Payer: Medicare Other | Admitting: Neurology

## 2017-07-05 VITALS — BP 147/71 | HR 59 | Ht 63.0 in | Wt 120.5 lb

## 2017-07-05 DIAGNOSIS — R413 Other amnesia: Secondary | ICD-10-CM

## 2017-07-05 DIAGNOSIS — G500 Trigeminal neuralgia: Secondary | ICD-10-CM | POA: Diagnosis not present

## 2017-07-05 MED ORDER — DONEPEZIL HCL 5 MG PO TABS: 5.0000 mg | ORAL_TABLET | Freq: Every day | ORAL | 1 refills | Status: DC

## 2017-07-05 NOTE — Progress Notes (Signed)
Reason for visit: Trigeminal neuralgia  GABBRIELLE Russo is an 83 y.o. female  History of present illness:  Suzanne Russo is an 83 year old right-handed white female with a history of trigeminal neuralgia affecting the right V3 distribution and she has a history of left occipital neuralgia. She also has reported some troubles with memory that have been coming on for the last year or more. The patient has had a fall with a sternal fracture that occurred around 03/18/2017. The patient is on carbamazepine taking 1000 mg daily and she is on gabapentin taking 200 mg twice daily for the neuralgia pain. The patient uses a cane or a rollator for ambulation. She has not had any further falls. She does claim that she has a tendency to veer to the left slightly when she is trying to walk. The patient has had some ongoing progression of memory. She currently lives with her husband. At night, she may have vivid dreams and she has difficulty telling the difference between her dreams and reality when she wakes up. She comes back to the office today for an evaluation.  Past Medical History:  Diagnosis Date  . Carpal tunnel syndrome   . Concussion with no loss of consciousness 08/26/2015  . Degenerative arthritis   . DVT (deep venous thrombosis) (HCC)   . GERD (gastroesophageal reflux disease)   . Hemorrhoids   . Hypothyroidism   . Memory difficulty 07/16/2014  . Occipital neuralgia   . Occipital neuralgia of left side 12/15/2014  . Osteoporosis   . Pelvic fracture (HCC)   . Peptic ulcer disease   . Right clavicle fracture   . Trigeminal neuralgia   . Venous insufficiency     Past Surgical History:  Procedure Laterality Date  . bilateral cataract surgery    . bilateral knee surgery    . left hip fracture    . left thumb surgery    . left toe surgery    . NASAL SINUS SURGERY    . TONSILLECTOMY    . VAGINAL HYSTERECTOMY      Family History  Problem Relation Age of Onset  . Heart disease Mother     . COPD Father   . Congestive Heart Failure Brother     Social history:  reports that she has quit smoking. Her smoking use included Cigarettes. She has a 45.00 pack-year smoking history. She has never used smokeless tobacco. She reports that she drinks about 4.2 oz of alcohol per week . She reports that she does not use drugs.    Allergies  Allergen Reactions  . Citalopram Other (See Comments)    Caused bad dreams  . Sulfa Antibiotics Rash  . Tetracycline Rash    Medications:  Prior to Admission medications   Medication Sig Start Date End Date Taking? Authorizing Provider  acetaminophen (TYLENOL) 500 MG tablet Take 500 mg by mouth every 8 (eight) hours as needed.   Yes [provider]  Biotin 300 MCG TABS Take 1 tablet (300 mcg total) by mouth daily. 03/20/17  Yes Katharina Caper, MD  Calcium Carbonate-Vitamin D (CALCIUM + D PO) Take 1 tablet by mouth 2 (two) times daily.    Yes [provider]  carbamazepine (CARBATROL) 200 MG 12 hr capsule Take 1 capsule (200 mg total) by mouth 2 (two) times daily. 12/29/16  Yes York Spaniel, MD  carbamazepine (CARBATROL) 300 MG 12 hr capsule Take 1 capsule (300 mg total) by mouth 2 (two) times daily. 05/07/17  Yes York Spaniel, MD  celecoxib (CELEBREX) 200 MG capsule Take 200 mg by mouth daily. 08/19/15  Yes [provider]  Cyanocobalamin (B-12 PO) Take 1 tablet by mouth daily.    Yes [provider]  gabapentin (NEURONTIN) 100 MG capsule Take 2 capsules (200 mg total) by mouth 2 (two) times daily. Patient taking differently: Take 300 mg by mouth See admin instructions. tk  in the morning and  qhs 12/28/16  Yes York Spaniel, MD  hydrocortisone (ANUSOL-HC) 25 MG suppository Place 25 mg rectally as needed.   Yes [provider]  levothyroxine (SYNTHROID, LEVOTHROID) 100 MCG tablet Take 100 mcg by mouth daily before breakfast.   Yes [provider]  lidocaine (LIDODERM) 5 %  Place 2 patches onto the skin daily. Remove & Discard patch within 12 hours or as directed by MD 03/21/17  Yes Katharina Caper, MD  lidocaine-prilocaine (EMLA) cream Apply 1 application topically as needed. 03/20/17  Yes Katharina Caper, MD  loratadine-pseudoephedrine (CLARITIN-D 24-HOUR) 10-240 MG 24 hr tablet Take 1 tablet by mouth daily.   Yes [provider]  meclizine (ANTIVERT) 25 MG tablet Take 25 mg by mouth every 6 (six) hours as needed.   Yes [provider]  Omega-3 Fatty Acids (FISH OIL) 1000 MG CAPS Take 1,000 mg by mouth daily.    Yes [provider]  omeprazole (PRILOSEC) 20 MG capsule Take 20 mg by mouth daily.   Yes [provider]  oxyCODONE (OXY IR/ROXICODONE) 5 MG immediate release tablet Take 1 tablet (5 mg total) by mouth every 4 (four) hours as needed for moderate pain, severe pain or breakthrough pain. 03/20/17  Yes Katharina Caper, MD  Potassium Gluconate 550 MG TABS Take 1 tablet by mouth every evening.   Yes [provider]  triamcinolone cream (KENALOG) 0.1 % Apply 1 application topically 2 (two) times daily. Compounded with eucerin   Yes [provider]  vitamin E 400 UNIT capsule Take 400 Units by mouth daily.   Yes [provider]    ROS:  Out of a complete 14 system review of symptoms, the patient complains only of the following symptoms, and all other reviewed systems are negative.  Memory loss, dizziness, headache Agitation, confusion, depression  Blood pressure (!) 147/71, pulse (!) 59, height  (1.6 m), weight 120 lb 8 oz (54.7 kg).  Physical Exam  General: The patient is alert and cooperative at the time of the examination.  Neuromuscular: The patient has flexion of the neck, unable to completely extend the neck.  Skin: No significant peripheral edema is noted.   Neurologic Exam  Mental status: The patient is alert and oriented x 3 at the time of the examination. The patient has apparent  normal recent and remote memory, with an apparently normal attention span and concentration ability. Mini-Mental Status Examination done today shows a total score 26/30.   Cranial nerves: Facial symmetry is present. Speech is normal, no aphasia or dysarthria is noted. Extraocular movements are full. Visual fields are full.  Motor: The patient has good strength in all 4 extremities.  Sensory examination: Soft touch sensation is symmetric on the face, arms, and legs.  Coordination: The patient has good finger-nose-finger and heel-to-shin bilaterally.  Gait and station: The patient has a minimally wide-based gait, the patient is able to walk independently. She usually uses a cane. Tandem gait is slightly unsteady. Romberg is negative. No drift is seen.  Reflexes: Deep tendon reflexes are symmetric.  Assessment/Plan:  1. Trigeminal neuralgia, well controlled  2. Mild gait disorder  3. Memory disorder  The patient has had some problems with gait instability, we will try to reduce the dosing of the Carbatrol as the patient is not having any pain currently. The patient is interested in going on medication for memory, we will start Aricept. The patient already has some problems with rapid bowel transit and she may have diarrhea after eating each meal. We need to watch this on the Aricept, if the symptoms significantly increase or she starts losing weight, this medication will need to be discontinued. The patient will contact me if the pain ensues that she comes down on the Carbatrol. She will stop one 200 mg Carbatrol tablet for 3 weeks and if she does well she will stop the second 200 mg tablet. She will follow-up in 6 months.  Marlan Palau MD 07/05/2017 1:38 PM  [facility] [address] [place], [place] [address]  Phone [phone] Fax [phone]

## 2017-07-05 NOTE — Patient Instructions (Signed)
   We will start Aricept 5 mg at night for memory.  Begin Aricept (donepezil) at 5 mg at night for one month. If this medication is well-tolerated, please call our office and we will call in a prescription for the 10 mg tablets. Look out for side effects that may include nausea, diarrhea, weight loss, or stomach cramps. This medication will also cause a runny nose, therefore there is no need for allergy medications for this purpose.   With the Carbatrol 200 mg tablets reduce to one a day for 3 weeks, then stop the Carbatrol 200 mg tablets altogether if the neuralgia pain does not come back.  Stay on the Carbatrol 300 mg tablets twice a day.

## 2017-08-02 ENCOUNTER — Other Ambulatory Visit: Payer: Self-pay | Admitting: Neurology

## 2017-09-06 ENCOUNTER — Other Ambulatory Visit: Payer: Self-pay | Admitting: Neurology

## 2017-10-25 ENCOUNTER — Other Ambulatory Visit: Payer: Self-pay | Admitting: Neurology

## 2017-12-10 ENCOUNTER — Other Ambulatory Visit: Payer: Self-pay | Admitting: Neurology

## 2018-01-03 ENCOUNTER — Encounter: Payer: Self-pay | Admitting: Neurology

## 2018-01-03 ENCOUNTER — Ambulatory Visit: Payer: Medicare Other | Admitting: Neurology

## 2018-01-03 VITALS — BP 156/69 | HR 55 | Ht 63.0 in | Wt 116.0 lb

## 2018-01-03 DIAGNOSIS — R413 Other amnesia: Secondary | ICD-10-CM | POA: Diagnosis not present

## 2018-01-03 DIAGNOSIS — Z5181 Encounter for therapeutic drug level monitoring: Secondary | ICD-10-CM

## 2018-01-03 DIAGNOSIS — G500 Trigeminal neuralgia: Secondary | ICD-10-CM | POA: Diagnosis not present

## 2018-01-03 MED ORDER — CARBAMAZEPINE ER 200 MG PO CP12: 200.0000 mg | ORAL_CAPSULE | Freq: Two times a day (BID) | ORAL | 3 refills | Status: DC

## 2018-01-03 MED ORDER — GABAPENTIN 300 MG PO CAPS: 300.0000 mg | ORAL_CAPSULE | Freq: Two times a day (BID) | ORAL | 3 refills | Status: DC

## 2018-01-03 NOTE — Progress Notes (Signed)
Reason for visit: Trigeminal neuralgia, gait disorder  Suzanne Russo is an 84 y.o. female  History of present illness:  Suzanne Russo is an 84 year old right-handed white female with a history of trigeminal neuralgia and a history of left occipital neuralgia.  The patient has had some problems with gait instability, we tried to taper her off of the carbamazepine but she began having severe pain again and had to restart the medication.  The patient is also on gabapentin taking 300 mg twice daily.  She has had falls on occasion, the last significant fall was in December 2018.  The patient has not had any fractures.  She is now using a Rollator to get around, she has not fallen with the Rollator.  The patient was also given a trial on Aricept for her memory problems, she could not tolerate this secondary to diarrhea.  Currently, the patient does not wish to go on any new medication for memory.  She returns to this office for an evaluation.  She does report a tremor-like sensation at times in the legs.  Past Medical History:  Diagnosis Date  . Carpal tunnel syndrome   . Concussion with no loss of consciousness 08/26/2015  . Degenerative arthritis   . DVT (deep venous thrombosis) (HCC)   . GERD (gastroesophageal reflux disease)   . Hemorrhoids   . Hypothyroidism   . Memory difficulty 07/16/2014  . Occipital neuralgia   . Occipital neuralgia of left side 12/15/2014  . Osteoporosis   . Pelvic fracture (HCC)   . Peptic ulcer disease   . Right clavicle fracture   . Trigeminal neuralgia   . Venous insufficiency     Past Surgical History:  Procedure Laterality Date  . bilateral cataract surgery    . bilateral knee surgery    . left hip fracture    . left thumb surgery    . left toe surgery    . NASAL SINUS SURGERY    . TONSILLECTOMY    . VAGINAL HYSTERECTOMY      Family History  Problem Relation Age of Onset  . Heart disease Mother   . COPD Father   . Congestive Heart Failure Brother      Social history:  reports that she has quit smoking. Her smoking use included cigarettes. She has a 45.00 pack-year smoking history. She has never used smokeless tobacco. She reports that she drinks about 4.2 oz of alcohol per week. She reports that she does not use drugs.    Allergies  Allergen Reactions  . Aricept [Donepezil]     GI upset  . Citalopram Other (See Comments)    Caused bad dreams  . Sulfa Antibiotics Rash  . Tetracycline Rash    Medications:  Prior to Admission medications   Medication Sig Start Date End Date Taking? Authorizing Provider  acetaminophen (TYLENOL) 500 MG tablet Take 500 mg by mouth every 8 (eight) hours as needed.   Yes [provider]  Biotin 300 MCG TABS Take 1 tablet (300 mcg total) by mouth daily. 03/20/17  Yes Katharina Caper, MD  Calcium Carbonate-Vitamin D (CALCIUM + D PO) Take 1 tablet by mouth 2 (two) times daily.    Yes [provider]  carbamazepine (CARBATROL) 200 MG 12 hr capsule Take 1 capsule (200 mg total) by mouth 2 (two) times daily. 01/03/18  Yes York Spaniel, MD  celecoxib (CELEBREX) 200 MG capsule Take 200 mg by mouth daily. 08/19/15  Yes [provider]  Cyanocobalamin (B-12 PO) Take 1 tablet by mouth daily.    Yes [provider]  hydrocortisone (ANUSOL-HC) 25 MG suppository Place 25 mg rectally as needed.   Yes [provider]  levothyroxine (SYNTHROID, LEVOTHROID) 100 MCG tablet Take 100 mcg by mouth daily before breakfast.   Yes [provider]  loratadine-pseudoephedrine (CLARITIN-D 24-HOUR) 10-240 MG 24 hr tablet Take 1 tablet by mouth daily.   Yes [provider]  meclizine (ANTIVERT) 25 MG tablet Take 25 mg by mouth every 6 (six) hours as needed.   Yes [provider]  Omega-3 Fatty Acids (FISH OIL) 1000 MG CAPS Take 1,000 mg by mouth daily.    Yes [provider]  omeprazole (PRILOSEC) 20 MG capsule Take 20 mg by mouth daily.   Yes [provider]  oxyCODONE (OXY IR/ROXICODONE) 5 MG immediate release tablet Take 1 tablet (5 mg total) by mouth every 4 (four) hours as needed for moderate pain, severe pain or breakthrough pain. 03/20/17  Yes Katharina Caper, MD  Potassium Gluconate 550 MG TABS Take 1 tablet by mouth every evening.   Yes [provider]  triamcinolone cream (KENALOG) 0.1 % Apply 1 application topically 2 (two) times daily. Compounded with eucerin   Yes [provider]  vitamin E 400 UNIT capsule Take 400 Units by mouth daily.   Yes [provider]  zolendronic acid (ZOMETA) 4 MG/5ML injection Inject 4 mg into the vein once. Last received inj 11/07/17   Yes [provider]  gabapentin (NEURONTIN) 300 MG capsule Take 1 capsule (300 mg total) by mouth 2 (two) times daily. 01/03/18   York Spaniel, MD    ROS:  Out of a complete 14 system review of symptoms, the patient complains only of the following symptoms, and all other reviewed systems are negative.  Gait instability Memory problems  Blood pressure (!) 156/69, pulse (!) 55, height 5\' 3"  (1.6 m), weight 116 lb (52.6 kg).  Physical Exam  General: The patient is alert and cooperative at the time of the examination.  Skin: No significant peripheral edema is noted.   Neurologic Exam  Mental status: The patient is alert and oriented x 2 at the time of the examination (not oriented to date). The Mini-Mental status examination done today shows a total score 25/30.   Cranial nerves: Facial symmetry is present. Speech is normal, no aphasia or dysarthria is noted. Extraocular movements are full. Visual fields are full.  Motor: The patient has good strength in all 4 extremities.  Sensory examination: Soft touch sensation is symmetric on the face, arms, and legs.  Coordination: The patient has good finger-nose-finger and heel-to-shin bilaterally.  Gait and station: The patient has an unsteady gait, she uses a Rollator  for ambulation and appears to be fairly stable with this.  Tandem gait was not attempted.  Romberg is positive, the patient tends to drift to the left.  Reflexes: Deep tendon reflexes are symmetric.   Assessment/Plan:  1.  Memory disturbance  2.  Gait disturbance  3.  Trigeminal and occipital neuralgia  The patient is well controlled with her pain on carbamazepine.  She indicated that when she tapered off of carbamazepine she did not notice any improvement in her ability to ambulate.  She is back on the medication secondary to return of neuralgia pain.  She will have blood work done today.  She does not wish to go on any other medications for memory.  She will follow-up in  6 months.  Marlan Palau. Keith Quaran Kedzierski MD 01/03/2018 2:55 PM  [facility] [address] [place], [place] [address]  Phone [phone] Fax [phone]

## 2018-01-04 LAB — CBC WITH DIFFERENTIAL/PLATELET
BASOS ABS: 0 10*3/uL (ref 0.0–0.2)
Basos: 1 %
EOS (ABSOLUTE): 0.2 10*3/uL (ref 0.0–0.4)
Eos: 3 %
HEMOGLOBIN: 9.8 g/dL — AB (ref 11.1–15.9)
Hematocrit: 30.6 % — ABNORMAL LOW (ref 34.0–46.6)
Immature Grans (Abs): 0 10*3/uL (ref 0.0–0.1)
Immature Granulocytes: 0 %
LYMPHS ABS: 1.7 10*3/uL (ref 0.7–3.1)
LYMPHS: 37 %
MCH: 29.8 pg (ref 26.6–33.0)
MCHC: 32 g/dL (ref 31.5–35.7)
MCV: 93 fL (ref 79–97)
MONOCYTES: 16 %
Monocytes Absolute: 0.7 10*3/uL (ref 0.1–0.9)
Neutrophils Absolute: 1.9 10*3/uL (ref 1.4–7.0)
Neutrophils: 43 %
PLATELETS: 270 10*3/uL (ref 150–379)
RBC: 3.29 x10E6/uL — AB (ref 3.77–5.28)
RDW: 14.3 % (ref 12.3–15.4)
WBC: 4.5 10*3/uL (ref 3.4–10.8)

## 2018-01-04 LAB — COMPREHENSIVE METABOLIC PANEL
A/G RATIO: 1.7 (ref 1.2–2.2)
ALK PHOS: 61 IU/L (ref 39–117)
ALT: 19 IU/L (ref 0–32)
AST: 24 IU/L (ref 0–40)
Albumin: 4.4 g/dL (ref 3.5–4.7)
BILIRUBIN TOTAL: 0.3 mg/dL (ref 0.0–1.2)
BUN/Creatinine Ratio: 29 — ABNORMAL HIGH (ref 12–28)
BUN: 20 mg/dL (ref 8–27)
CHLORIDE: 99 mmol/L (ref 96–106)
CO2: 27 mmol/L (ref 20–29)
Calcium: 9.1 mg/dL (ref 8.7–10.3)
Creatinine, Ser: 0.68 mg/dL (ref 0.57–1.00)
GFR calc Af Amer: 94 mL/min/{1.73_m2} (ref >59)
GFR calc non Af Amer: 81 mL/min/{1.73_m2} (ref >59)
GLUCOSE: 89 mg/dL (ref 65–99)
Globulin, Total: 2.6 g/dL (ref 1.5–4.5)
POTASSIUM: 4.7 mmol/L (ref 3.5–5.2)
Sodium: 141 mmol/L (ref 134–144)
Total Protein: 7 g/dL (ref 6.0–8.5)

## 2018-01-04 LAB — CARBAMAZEPINE LEVEL, TOTAL: CARBAMAZEPINE LVL: 9.9 ug/mL (ref 4.0–12.0)

## 2018-05-02 ENCOUNTER — Other Ambulatory Visit: Payer: Self-pay | Admitting: Neurology

## 2018-05-02 ENCOUNTER — Telehealth: Payer: Self-pay | Admitting: Neurology

## 2018-05-02 MED ORDER — CARBAMAZEPINE ER 300 MG PO CP12: 300.0000 mg | ORAL_CAPSULE | Freq: Two times a day (BID) | ORAL | 2 refills | Status: DC

## 2018-05-02 NOTE — Telephone Encounter (Signed)
In the past, the patient was on 600 mg of Carbatrol twice daily for a total of 1200 mg of carbamazepine a day, she was cut back on the carbamazepine some, she could not come off the medication.  She currently is taking 500 mg of carbamazepine twice daily.  She will be taking 200 mg extended release twice daily, and 300 mg extended release twice daily.

## 2018-05-02 NOTE — Telephone Encounter (Signed)
Okey RegalCarol with [facility] pharmacy requesting a call at [phone], stating the pt is requesting refill for Carbamazepine 300 MG. Okey RegalCarol states the pt has tablets for carbamazepine (CARBATROL) 200 MG 12 hr capsule but looks like Dr. Anne HahnWillis disconnected  Carbamazepine 300 MG during office visit please call to clarify

## 2018-05-02 NOTE — Telephone Encounter (Signed)
Dr. Anne HahnWillis- please advise I called Okey RegalCarol back. They received refill on carbamazepine 200mg  tab 01/03/18 qty 180, 3 refills but pt requesting refill on carbamazepine (CARBATROL) 300 MG 12 hr capsule 1 tab twice daily as well. Pharmacy has on record that she takes both. They want to know what she should be taking. Advised I will send message to Dr. Anne HahnWillis to clarify.

## 2018-07-18 ENCOUNTER — Encounter: Payer: Self-pay | Admitting: Neurology

## 2018-07-18 ENCOUNTER — Ambulatory Visit: Payer: Medicare Other | Admitting: Neurology

## 2018-07-18 VITALS — BP 132/67 | HR 56 | Ht 63.0 in | Wt 114.0 lb

## 2018-07-18 DIAGNOSIS — G500 Trigeminal neuralgia: Secondary | ICD-10-CM

## 2018-07-18 DIAGNOSIS — M5481 Occipital neuralgia: Secondary | ICD-10-CM

## 2018-07-18 DIAGNOSIS — R413 Other amnesia: Secondary | ICD-10-CM | POA: Diagnosis not present

## 2018-07-18 NOTE — Patient Instructions (Signed)
Reduce the gabapentin to 300 mg at night for 3 weeks, then stop.

## 2018-07-18 NOTE — Progress Notes (Signed)
Reason for visit: Gait disturbance, memory disturbance, trigeminal neuralgia  Suzanne Russo is an 84 y.o. female  History of present illness:  Suzanne Russo is an 84 year old right-handed white female with a history of trigeminal neuralgia, she takes 1000 mg of carbamazepine daily, she is on gabapentin 600 mg daily.  The patient does have a gait disturbance, she has fallen several times since last seen, she uses a rolling walker to ambulate.  The patient has had a poor appetite, she has lost 2 pounds since last seen.  She has had ongoing memory problems.  She has vivid dreams at night and she may occasionally wake up in the morning and has difficulty separating reality from what she was dreaming about.  She does not have overt hallucinations.  The patient has had vivid dreams on Aricept, she does not wish to go on another medication for memory.  She returns for an evaluation.  Past Medical History:  Diagnosis Date  . Carpal tunnel syndrome   . Concussion with no loss of consciousness 08/26/2015  . Degenerative arthritis   . DVT (deep venous thrombosis) (HCC)   . GERD (gastroesophageal reflux disease)   . Hemorrhoids   . Hypothyroidism   . Memory difficulty 07/16/2014  . Occipital neuralgia   . Occipital neuralgia of left side 12/15/2014  . Osteoporosis   . Pelvic fracture (HCC)   . Peptic ulcer disease   . Right clavicle fracture   . Trigeminal neuralgia   . Venous insufficiency     Past Surgical History:  Procedure Laterality Date  . bilateral cataract surgery    . bilateral knee surgery    . left hip fracture    . left thumb surgery    . left toe surgery    . NASAL SINUS SURGERY    . TONSILLECTOMY    . VAGINAL HYSTERECTOMY      Family History  Problem Relation Age of Onset  . Heart disease Mother   . COPD Father   . Congestive Heart Failure Brother     Social history:  reports that she has quit smoking. Her smoking use included cigarettes. She has a 45.00 pack-year  smoking history. She has never used smokeless tobacco. She reports that she drinks about 7.0 standard drinks of alcohol per week. She reports that she does not use drugs.    Allergies  Allergen Reactions  . Aricept [Donepezil]     GI upset  . Citalopram Other (See Comments)    Caused bad dreams  . Sulfa Antibiotics Rash  . Tetracycline Rash    Medications:  Prior to Admission medications   Medication Sig Start Date End Date Taking? Authorizing Provider  acetaminophen (TYLENOL) 500 MG tablet Take 500 mg by mouth every 8 (eight) hours as needed.   Yes [provider]  Biotin 300 MCG TABS Take 1 tablet (300 mcg total) by mouth daily. 03/20/17  Yes Katharina Caper, MD  Calcium Carbonate-Vitamin D (CALCIUM + D PO) Take 1 tablet by mouth 2 (two) times daily.    Yes [provider]  carbamazepine (CARBATROL) 200 MG 12 hr capsule Take 1 capsule (200 mg total) by mouth 2 (two) times daily. 01/03/18  Yes York Spaniel, MD  carbamazepine (CARBATROL) 300 MG 12 hr capsule Take 1 capsule (300 mg total) by mouth 2 (two) times daily. 05/02/18  Yes York Spaniel, MD  celecoxib (CELEBREX) 200 MG capsule Take 200 mg by mouth daily. 08/19/15  Yes [provider]  Cyanocobalamin (B-12 PO) Take 1 tablet by mouth daily.    Yes [provider]  gabapentin (NEURONTIN) 300 MG capsule Take 1 capsule (300 mg total) by mouth 2 (two) times daily. 01/03/18  Yes York Spaniel, MD  hydrocortisone (ANUSOL-HC) 25 MG suppository Place 25 mg rectally as needed.   Yes [provider]  levothyroxine (SYNTHROID, LEVOTHROID) 100 MCG tablet Take 100 mcg by mouth daily before breakfast.   Yes [provider]  loratadine-pseudoephedrine (CLARITIN-D 24-HOUR) 10-240 MG 24 hr tablet Take 1 tablet by mouth daily.   Yes [provider]  meclizine (ANTIVERT) 25 MG tablet Take 25 mg by mouth every 6 (six) hours as needed.   Yes [provider]  Omega-3 Fatty  Acids (FISH OIL) 1000 MG CAPS Take 1,000 mg by mouth daily.    Yes [provider]  omeprazole (PRILOSEC) 20 MG capsule Take 20 mg by mouth daily.   Yes [provider]  Potassium Gluconate 550 MG TABS Take 1 tablet by mouth every evening.   Yes [provider]  triamcinolone cream (KENALOG) 0.1 % Apply 1 application topically 2 (two) times daily. Compounded with eucerin   Yes [provider]  vitamin E 400 UNIT capsule Take 400 Units by mouth daily.   Yes [provider]  zolendronic acid (ZOMETA) 4 MG/5ML injection Inject 4 mg into the vein once. Last received inj 11/07/17   Yes [provider]    ROS:  Out of a complete 14 system review of symptoms, the patient complains only of the following symptoms, and all other reviewed systems are negative.  Decreased activity, decreased appetite Weakness, tremors, memory loss Walking problems  Blood pressure 132/67, pulse (!) 56, height 5\' 3"  (1.6 m), weight 114 lb (51.7 kg).  Physical Exam  General: The patient is alert and cooperative at the time of the examination.  Skin: No significant peripheral edema is noted.   Neurologic Exam  Mental status: The patient is alert and oriented x 3 at the time of the examination. The Mini-Mental status examination done today shows a total score of 26/30.   Cranial nerves: Facial symmetry is present. Speech is normal, no aphasia or dysarthria is noted. Extraocular movements are full. Visual fields are full.  Motor: The patient has good strength in all 4 extremities.  Sensory examination: Soft touch sensation is symmetric on the face, arms, and legs.  Coordination: The patient has good finger-nose-finger and heel-to-shin bilaterally.  Gait and station: The patient has a slightly wide-based unsteady gait, she walks with a rolling walker and does well with this.  Tandem gait was not attempted.  Romberg is negative.  Reflexes: Deep tendon reflexes  are symmetric.   Assessment/Plan:  1.  Gait disturbance  2.  Memory disturbance  3.  Trigeminal neuralgia  Given the gait instability, we will try to reduce the dose of gabapentin going to 300 mg at night for 3 weeks and then stop the drug if possible.  If the pain returns, she will have to restart the medication.  The patient will continue the carbamazepine.  The patient will follow-up in 6 months.  We discussed the possibility of starting Namenda, the patient does not wish to consider this medication at this time.  Marlan Palau MD 07/18/2018 2:42 PM  [facility] [address] [place], [place] [address]  Phone [phone] Fax [phone]

## 2018-11-13 ENCOUNTER — Other Ambulatory Visit: Payer: Self-pay

## 2018-11-13 ENCOUNTER — Emergency Department: Payer: Medicare Other

## 2018-11-13 ENCOUNTER — Emergency Department
Admission: EM | Admit: 2018-11-13 | Discharge: 2018-11-13 | Disposition: A | Discharge status: Other Acute Inpatient Hospital | Payer: Medicare Other | Attending: Emergency Medicine | Admitting: Emergency Medicine

## 2018-11-13 DIAGNOSIS — S0240EA Zygomatic fracture, right side, initial encounter for closed fracture: Secondary | ICD-10-CM | POA: Diagnosis not present

## 2018-11-13 DIAGNOSIS — S0231XA Fracture of orbital floor, right side, initial encounter for closed fracture: Secondary | ICD-10-CM | POA: Insufficient documentation

## 2018-11-13 DIAGNOSIS — Y9389 Activity, other specified: Secondary | ICD-10-CM | POA: Diagnosis not present

## 2018-11-13 DIAGNOSIS — S0292XA Unspecified fracture of facial bones, initial encounter for closed fracture: Secondary | ICD-10-CM

## 2018-11-13 DIAGNOSIS — S02101A Fracture of base of skull, right side, initial encounter for closed fracture: Secondary | ICD-10-CM | POA: Diagnosis not present

## 2018-11-13 DIAGNOSIS — Z87891 Personal history of nicotine dependence: Secondary | ICD-10-CM | POA: Diagnosis not present

## 2018-11-13 DIAGNOSIS — Z79899 Other long term (current) drug therapy: Secondary | ICD-10-CM | POA: Insufficient documentation

## 2018-11-13 DIAGNOSIS — Y998 Other external cause status: Secondary | ICD-10-CM | POA: Diagnosis not present

## 2018-11-13 DIAGNOSIS — F0390 Unspecified dementia without behavioral disturbance: Secondary | ICD-10-CM | POA: Insufficient documentation

## 2018-11-13 DIAGNOSIS — S066X0A Traumatic subarachnoid hemorrhage without loss of consciousness, initial encounter: Secondary | ICD-10-CM | POA: Insufficient documentation

## 2018-11-13 DIAGNOSIS — Y92009 Unspecified place in unspecified non-institutional (private) residence as the place of occurrence of the external cause: Secondary | ICD-10-CM | POA: Insufficient documentation

## 2018-11-13 DIAGNOSIS — I609 Nontraumatic subarachnoid hemorrhage, unspecified: Secondary | ICD-10-CM

## 2018-11-13 DIAGNOSIS — S0993XA Unspecified injury of face, initial encounter: Secondary | ICD-10-CM | POA: Diagnosis present

## 2018-11-13 DIAGNOSIS — W19XXXA Unspecified fall, initial encounter: Secondary | ICD-10-CM | POA: Insufficient documentation

## 2018-11-13 LAB — CK: Total CK: 203 U/L (ref 38–234)

## 2018-11-13 LAB — URINALYSIS, COMPLETE (UACMP) WITH MICROSCOPIC
Bilirubin Urine: NEGATIVE
Glucose, UA: NEGATIVE mg/dL
Hgb urine dipstick: NEGATIVE
Ketones, ur: 5 mg/dL — AB
Leukocytes, UA: NEGATIVE
Nitrite: NEGATIVE
Protein, ur: NEGATIVE mg/dL
Specific Gravity, Urine: 1.016 (ref 1.005–1.030)
pH: 7 (ref 5.0–8.0)

## 2018-11-13 LAB — CBC WITH DIFFERENTIAL/PLATELET
Abs Immature Granulocytes: 0.03 10*3/uL (ref 0.00–0.07)
Basophils Absolute: 0.1 10*3/uL (ref 0.0–0.1)
Basophils Relative: 1 %
Eosinophils Absolute: 0 10*3/uL (ref 0.0–0.5)
Eosinophils Relative: 0 %
HCT: 30.5 % — ABNORMAL LOW (ref 36.0–46.0)
HEMOGLOBIN: 9.9 g/dL — AB (ref 12.0–15.0)
Immature Granulocytes: 0 %
Lymphocytes Relative: 10 %
Lymphs Abs: 0.9 10*3/uL (ref 0.7–4.0)
MCH: 31 pg (ref 26.0–34.0)
MCHC: 32.5 g/dL (ref 30.0–36.0)
MCV: 95.6 fL (ref 80.0–100.0)
Monocytes Absolute: 0.7 10*3/uL (ref 0.1–1.0)
Monocytes Relative: 8 %
Neutro Abs: 7 10*3/uL (ref 1.7–7.7)
Neutrophils Relative %: 81 %
Platelets: 297 10*3/uL (ref 150–400)
RBC: 3.19 MIL/uL — ABNORMAL LOW (ref 3.87–5.11)
RDW: 12.8 % (ref 11.5–15.5)
WBC: 8.8 10*3/uL (ref 4.0–10.5)
nRBC: 0 % (ref 0.0–0.2)

## 2018-11-13 LAB — COMPREHENSIVE METABOLIC PANEL
ALT: 20 U/L (ref 0–44)
ANION GAP: 7 (ref 5–15)
AST: 28 U/L (ref 15–41)
Albumin: 3.9 g/dL (ref 3.5–5.0)
Alkaline Phosphatase: 61 U/L (ref 38–126)
BUN: 23 mg/dL (ref 8–23)
CO2: 28 mmol/L (ref 22–32)
Calcium: 8.5 mg/dL — ABNORMAL LOW (ref 8.9–10.3)
Chloride: 99 mmol/L (ref 98–111)
Creatinine, Ser: 0.51 mg/dL (ref 0.44–1.00)
GFR calc Af Amer: >60 mL/min (ref >60)
GFR calc non Af Amer: >60 mL/min (ref >60)
Glucose, Bld: 121 mg/dL — ABNORMAL HIGH (ref 70–99)
Potassium: 4.3 mmol/L (ref 3.5–5.1)
Sodium: 134 mmol/L — ABNORMAL LOW (ref 135–145)
Total Bilirubin: 0.7 mg/dL (ref 0.3–1.2)
Total Protein: 7 g/dL (ref 6.5–8.1)

## 2018-11-13 LAB — TROPONIN I: Troponin I: <0.03 ng/mL (ref <0.03)

## 2018-11-13 MED ORDER — ACETAMINOPHEN 500 MG PO TABS
1000.0000 mg | ORAL_TABLET | Freq: Once | ORAL | Status: AC
  Administered 2018-11-13: 1000 mg via ORAL
  Filled 2018-11-13: qty 2

## 2018-11-13 MED ORDER — SODIUM CHLORIDE 0.9 % IV SOLN
75.00 | INTRAVENOUS | Status: DC
Start: ? — End: 2018-11-13

## 2018-11-13 NOTE — ED Provider Notes (Signed)
[facility] Emergency Department Provider Note  ____________________________________________  Time seen: Approximately 10:45 AM  I have reviewed the triage vital signs and the nursing notes.   HISTORY  Chief Complaint Fall  Level 5 caveat:  Portions of the history and physical were unable to be obtained due to dementia   HPI Suzanne Russo is a 85 y.o. female with history of osteoporosis, dementia, hypothyroidism, peptic ulcer disease who presents for evaluation of fall.  Patient coming from home.  Unwitnessed fall.  Unknown downtime.  Found this morning by family member with bruising on the right side of her face.  Patient does not remember falling.  Patient is complaining of moderate sharp pain located in her head.  He denies neck pain or back pain, denies extremity pain, chest pain or abdominal pain.  No trouble breathing.  She is not on blood thinners.  Past Medical History:  Diagnosis Date  . Carpal tunnel syndrome   . Concussion with no loss of consciousness 08/26/2015  . Degenerative arthritis   . DVT (deep venous thrombosis) (HCC)   . GERD (gastroesophageal reflux disease)   . Hemorrhoids   . Hypothyroidism   . Memory difficulty 07/16/2014  . Occipital neuralgia   . Occipital neuralgia of left side 12/15/2014  . Osteoporosis   . Pelvic fracture (HCC)   . Peptic ulcer disease   . Right clavicle fracture   . Trigeminal neuralgia   . Venous insufficiency     Patient Active Problem List   Diagnosis Date Noted  . Closed fracture of body of sternum 03/20/2017  . Anemia 03/20/2017  . Generalized weakness 03/20/2017  . Pleuritic chest pain 03/20/2017  . Hypothyroidism 03/20/2017  . Rib fractures 03/18/2017  . Chronic venous insufficiency 03/14/2017  . Ankle ulcer, left, limited to breakdown of skin (HCC) 01/03/2017  . Cellulitis of lower extremity 11/14/2016  . Swelling of limb 11/14/2016  . Lymphedema 11/14/2016  . Headache disorder  08/26/2015  . Concussion with no loss of consciousness 08/26/2015  . Occipital neuralgia of left side 12/15/2014  . Memory difficulty 07/16/2014  . Trigeminal neuralgia 07/11/2013    Past Surgical History:  Procedure Laterality Date  . bilateral cataract surgery    . bilateral knee surgery    . left hip fracture    . left thumb surgery    . left toe surgery    . NASAL SINUS SURGERY    . TONSILLECTOMY    . VAGINAL HYSTERECTOMY      Prior to Admission medications   Medication Sig Start Date End Date Taking? Authorizing Provider  acetaminophen (TYLENOL) 500 MG tablet Take 500 mg by mouth every 8 (eight) hours as needed.    [provider]  Biotin 300 MCG TABS Take 1 tablet (300 mcg total) by mouth daily. 03/20/17   Katharina Caper, MD  Calcium Carbonate-Vitamin D (CALCIUM + D PO) Take 1 tablet by mouth 2 (two) times daily.     [provider]  carbamazepine (CARBATROL) 200 MG 12 hr capsule Take 1 capsule (200 mg total) by mouth 2 (two) times daily. 01/03/18   York Spaniel, MD  carbamazepine (CARBATROL) 300 MG 12 hr capsule Take 1 capsule (300 mg total) by mouth 2 (two) times daily. 05/02/18   York Spaniel, MD  celecoxib (CELEBREX) 200 MG capsule Take 200 mg by mouth daily. 08/19/15   [provider]  Cyanocobalamin (B-12 PO) Take 1 tablet by mouth daily.  [provider]  hydrocortisone (ANUSOL-HC) 25 MG suppository Place 25 mg rectally as needed.    [provider]  levothyroxine (SYNTHROID, LEVOTHROID) 100 MCG tablet Take 100 mcg by mouth daily before breakfast.    [provider]  loratadine-pseudoephedrine (CLARITIN-D 24-HOUR) 10-240 MG 24 hr tablet Take 1 tablet by mouth daily.    [provider]  meclizine (ANTIVERT) 25 MG tablet Take 25 mg by mouth every 6 (six) hours as needed.    [provider]  Omega-3 Fatty Acids (FISH OIL) 1000 MG CAPS Take 1,000 mg by mouth daily.     [provider]    omeprazole (PRILOSEC) 20 MG capsule Take 20 mg by mouth daily.    [provider]  Potassium Gluconate 550 MG TABS Take 1 tablet by mouth every evening.    [provider]  triamcinolone cream (KENALOG) 0.1 % Apply 1 application topically 2 (two) times daily. Compounded with eucerin    [provider]  vitamin E 400 UNIT capsule Take 400 Units by mouth daily.    [provider]  zolendronic acid (ZOMETA) 4 MG/5ML injection Inject 4 mg into the vein once. Last received inj 11/07/17    [provider]    Allergies Aricept [donepezil]; Citalopram; Sulfa antibiotics; and Tetracycline  Family History  Problem Relation Age of Onset  . Heart disease Mother   . COPD Father   . Congestive Heart Failure Brother     Social History Social History   Tobacco Use  . Smoking status: Former Smoker    Packs/day: 1.00    Years: 45.00    Pack years: 45.00    Types: Cigarettes  . Smokeless tobacco: Never Used  Substance Use Topics  . Alcohol use: Yes    Alcohol/week: 7.0 standard drinks    Types: 7 Glasses of wine per week    Comment: wine on occasion  . Drug use: No    Review of Systems Constitutional: Negative for fever. Eyes: Negative for visual changes. ENT: + facial injury. No neck injury Cardiovascular: Negative for chest injury. Respiratory: Negative for shortness of breath. Negative for chest wall injury. Gastrointestinal: Negative for abdominal pain or injury. Genitourinary: Negative for dysuria. Musculoskeletal: Negative for back injury, negative for arm or leg pain. Skin: Negative for laceration/abrasions. Neurological: + head injury.   ____________________________________________   PHYSICAL EXAM:  VITAL SIGNS: ED Triage Vitals  Enc Vitals Group     BP 11/13/18 1012 (!) 180/85     Pulse Rate 11/13/18 1012 74     Resp 11/13/18 1012 16     Temp 11/13/18 1012 97.7 F (36.5 C)     Temp Source 11/13/18 1012 Oral     SpO2  11/13/18 1012 97 %     Weight 11/13/18 1019 114 lb 9 oz (52 kg)     Height 11/13/18 1019 5\' 5"  (1.651 m)     Head Circumference --      Peak Flow --      Pain Score 11/13/18 1019 7     Pain Loc --      Pain Edu? --      Excl. in GC? --    Full spinal precautions maintained throughout the trauma exam. Constitutional: Alert and oriented x 2. No acute distress. Does not appear intoxicated. HEENT Head: Normocephalic and atraumatic. Face: Stable midface, periorbital hematoma on the R, bruising on the R jaw region Ears: No hemotympanum bilaterally. No Battle sign Eyes: No eye injury.  PERRL. No raccoon eyes Nose: Nontender. No epistaxis. No rhinorrhea Mouth/Throat: Mucous membranes are moist. No oropharyngeal blood. No dental injury. Airway patent without stridor. Normal voice. Neck: no C-collar in place. No midline c-spine tenderness.  Cardiovascular: Normal rate, regular rhythm. Normal and symmetric distal pulses are present in all extremities. Pulmonary/Chest: Chest wall is stable and nontender to palpation/compression. Normal respiratory effort. Breath sounds are normal. No crepitus.  Abdominal: Soft, nontender, non distended. Musculoskeletal: Nontender with normal full range of motion in all extremities. No deformities. No thoracic or lumbar midline spinal tenderness. Pelvis is stable. Skin: Skin is warm, dry and intact. No abrasions or contutions. Psychiatric: Speech and behavior are appropriate. Neurological: Normal speech and language. Moves all extremities to command. No gross focal neurologic deficits are appreciated.  Glascow Coma Score: 4 - Opens eyes on own 6 - Follows simple motor commands 4 - Seems confused, disoriented GCS: 14   ____________________________________________   LABS (all labs ordered are listed, but only abnormal results are displayed)  Labs Reviewed  CBC WITH DIFFERENTIAL/PLATELET - Abnormal; Notable for the following components:      Result Value    RBC 3.19 (*)    Hemoglobin 9.9 (*)    HCT 30.5 (*)    All other components within normal limits  COMPREHENSIVE METABOLIC PANEL - Abnormal; Notable for the following components:   Sodium 134 (*)    Glucose, Bld 121 (*)    Calcium 8.5 (*)    All other components within normal limits  URINALYSIS, COMPLETE (UACMP) WITH MICROSCOPIC - Abnormal; Notable for the following components:   Color, Urine YELLOW (*)    APPearance HAZY (*)    Ketones, ur 5 (*)    Bacteria, UA RARE (*)    All other components within normal limits  CK  TROPONIN I   ____________________________________________  EKG  ED ECG REPORT I, Nita Sickle, the attending physician, personally viewed and interpreted this ECG.  NSR, rate 74, prolonged QTc, LAD, RBBB, LAFB, LVH, no STE or depressions.  Unchanged from prior ____________________________________________  RADIOLOGY  I have personally reviewed the images performed during this visit and I agree with the Radiologist's read.   Interpretation by Radiologist:  Dg Wrist Complete Left  Result Date: 11/13/2018 CLINICAL DATA:  Left wrist pain after fall last night. EXAM: LEFT WRIST - COMPLETE 3+ VIEW COMPARISON:  Radiographs of December 22, 2015. FINDINGS: No acute fracture or dislocation is noted. Old healed fifth metacarpal fracture is noted. Stable chronic resorption of trapezium is noted. Severe degenerative changes seen involving several intercarpal and carpometacarpal joints. No soft tissue abnormality is noted. Mild narrowing of radiocarpal joint is noted as well. IMPRESSION: Severe degenerative changes as described above. Old healed fifth metacarpal fracture. No acute fracture or dislocation is noted. Electronically Signed   By: Lupita Raider, M.D.   On: 11/13/2018 10:50   Ct Head Wo Contrast  Result Date: 11/13/2018 CLINICAL DATA:  Larey Seat last night with trauma to the head and face. EXAM: CT HEAD WITHOUT CONTRAST CT MAXILLOFACIAL WITHOUT CONTRAST CT CERVICAL SPINE  WITHOUT CONTRAST TECHNIQUE: Multidetector CT imaging of the head, cervical spine, and maxillofacial structures were performed using the standard protocol without intravenous contrast. Multiplanar CT image reconstructions of the cervical spine and maxillofacial structures were also generated. COMPARISON:  03/18/2017 FINDINGS: CT HEAD FINDINGS Brain: Generalized atrophy. Chronic small-vessel ischemic changes of the cerebral hemispheric white matter. No sign of acute infarction. No intraparenchymal hemorrhage. Blood layering dependently in the occipital horns of  the lateral ventricles. Subarachnoid air scattered about base of the brain. Vascular: There is atherosclerotic calcification of the major vessels at the base of the brain. Skull: Multiple right-sided facial fractures. Fracture of the greater wing of the sphenoid on the right with intracranial communication responsible for the pneumocephalus. Other: None CT MAXILLOFACIAL FINDINGS Osseous: Multiple nondisplaced facial fractures on the right. Nondisplaced fracture of the zygomatic arch. Nondisplaced fractures the lateral wall of the orbit extending back to the orbital apex. Nondisplaced fracture of the lamina papyracea/medial orbital wall. The fracture extends into the sphenoid bone along the greater wing of the sphenoid, affecting the skull base. Fracture extends laterally into the right frontal bone. Orbits: No evidence of intraorbital soft tissue injury. Extensive subcutaneous emphysema/orbital emphysema on the right. Sinuses: Some layering fluid in the left division of the sphenoid sinus. Chronic mucosal inflammatory changes and previous functional endoscopic sinus surgery. Soft tissues: Extensive air in the soft tissues as noted above. Intracranial air as noted above. CT CERVICAL SPINE FINDINGS Alignment: No traumatic malalignment. Straightening of the normal cervical lordosis. Chronic degenerative anterolisthesis at C5-6 and C6-7. Skull base and vertebrae:  No fracture.  See below. Soft tissues and spinal canal: Negative Disc levels: Chronic degenerative arthropathy at the C1-2 articulation. No encroachment upon the spinal canal. Chronic fusion from C2 through C5. Sufficient patency of the canal and foramina. C5-6 and C6-7 show chronic degenerative spondylosis and facet arthropathy with anterolisthesis of 4 mm. No likely compressive central canal stenosis. Bilateral foraminal narrowing left worse than right could cause exiting nerve root compression. C7-T1 shows facet osteoarthritis but no stenosis. Upper chest: Negative Other: None IMPRESSION: Head CT: Right-sided facial and skull base fractures as outlined above. None of these are significantly displaced. Fracture of the greater wing of the sphenoid results in pneumocephalus. No evidence of subdural or epidural hematoma. Small amount of subarachnoid blood layering dependently in the occipital horns of lateral ventricles. Brain atrophy and chronic small-vessel ischemic changes otherwise. Cervical spine CT: No acute or traumatic finding. Chronic cervical fusion C2 through C5 with degenerative spondylosis and facet arthropathy at C5-6 and C6-7. Maxillofacial CT: Nondisplaced fractures of the zygomatic arch on the right, the lateral orbital wall, the medial orbital wall, the greater wing of the sphenoid in the right frontal bone. Pneumocephalus secondary to the greater wing sphenoid fracture as noted above. Extensive subcutaneous emphysema and orbital emphysema. Electronically Signed   By: Paulina FusiMark  Shogry M.D.   On: 11/13/2018 11:27   Ct Cervical Spine Wo Contrast  Result Date: 11/13/2018 CLINICAL DATA:  Larey SeatFell last night with trauma to the head and face. EXAM: CT HEAD WITHOUT CONTRAST CT MAXILLOFACIAL WITHOUT CONTRAST CT CERVICAL SPINE WITHOUT CONTRAST TECHNIQUE: Multidetector CT imaging of the head, cervical spine, and maxillofacial structures were performed using the standard protocol without intravenous contrast.  Multiplanar CT image reconstructions of the cervical spine and maxillofacial structures were also generated. COMPARISON:  03/18/2017 FINDINGS: CT HEAD FINDINGS Brain: Generalized atrophy. Chronic small-vessel ischemic changes of the cerebral hemispheric white matter. No sign of acute infarction. No intraparenchymal hemorrhage. Blood layering dependently in the occipital horns of the lateral ventricles. Subarachnoid air scattered about base of the brain. Vascular: There is atherosclerotic calcification of the major vessels at the base of the brain. Skull: Multiple right-sided facial fractures. Fracture of the greater wing of the sphenoid on the right with intracranial communication responsible for the pneumocephalus. Other: None CT MAXILLOFACIAL FINDINGS Osseous: Multiple nondisplaced facial fractures on the right. Nondisplaced fracture of the zygomatic  arch. Nondisplaced fractures the lateral wall of the orbit extending back to the orbital apex. Nondisplaced fracture of the lamina papyracea/medial orbital wall. The fracture extends into the sphenoid bone along the greater wing of the sphenoid, affecting the skull base. Fracture extends laterally into the right frontal bone. Orbits: No evidence of intraorbital soft tissue injury. Extensive subcutaneous emphysema/orbital emphysema on the right. Sinuses: Some layering fluid in the left division of the sphenoid sinus. Chronic mucosal inflammatory changes and previous functional endoscopic sinus surgery. Soft tissues: Extensive air in the soft tissues as noted above. Intracranial air as noted above. CT CERVICAL SPINE FINDINGS Alignment: No traumatic malalignment. Straightening of the normal cervical lordosis. Chronic degenerative anterolisthesis at C5-6 and C6-7. Skull base and vertebrae: No fracture.  See below. Soft tissues and spinal canal: Negative Disc levels: Chronic degenerative arthropathy at the C1-2 articulation. No encroachment upon the spinal canal. Chronic  fusion from C2 through C5. Sufficient patency of the canal and foramina. C5-6 and C6-7 show chronic degenerative spondylosis and facet arthropathy with anterolisthesis of 4 mm. No likely compressive central canal stenosis. Bilateral foraminal narrowing left worse than right could cause exiting nerve root compression. C7-T1 shows facet osteoarthritis but no stenosis. Upper chest: Negative Other: None IMPRESSION: Head CT: Right-sided facial and skull base fractures as outlined above. None of these are significantly displaced. Fracture of the greater wing of the sphenoid results in pneumocephalus. No evidence of subdural or epidural hematoma. Small amount of subarachnoid blood layering dependently in the occipital horns of lateral ventricles. Brain atrophy and chronic small-vessel ischemic changes otherwise. Cervical spine CT: No acute or traumatic finding. Chronic cervical fusion C2 through C5 with degenerative spondylosis and facet arthropathy at C5-6 and C6-7. Maxillofacial CT: Nondisplaced fractures of the zygomatic arch on the right, the lateral orbital wall, the medial orbital wall, the greater wing of the sphenoid in the right frontal bone. Pneumocephalus secondary to the greater wing sphenoid fracture as noted above. Extensive subcutaneous emphysema and orbital emphysema. Electronically Signed   By: Paulina Fusi M.D.   On: 11/13/2018 11:27   Ct Maxillofacial Wo Contrast  Result Date: 11/13/2018 CLINICAL DATA:  Larey Seat last night with trauma to the head and face. EXAM: CT HEAD WITHOUT CONTRAST CT MAXILLOFACIAL WITHOUT CONTRAST CT CERVICAL SPINE WITHOUT CONTRAST TECHNIQUE: Multidetector CT imaging of the head, cervical spine, and maxillofacial structures were performed using the standard protocol without intravenous contrast. Multiplanar CT image reconstructions of the cervical spine and maxillofacial structures were also generated. COMPARISON:  03/18/2017 FINDINGS: CT HEAD FINDINGS Brain: Generalized atrophy.  Chronic small-vessel ischemic changes of the cerebral hemispheric white matter. No sign of acute infarction. No intraparenchymal hemorrhage. Blood layering dependently in the occipital horns of the lateral ventricles. Subarachnoid air scattered about base of the brain. Vascular: There is atherosclerotic calcification of the major vessels at the base of the brain. Skull: Multiple right-sided facial fractures. Fracture of the greater wing of the sphenoid on the right with intracranial communication responsible for the pneumocephalus. Other: None CT MAXILLOFACIAL FINDINGS Osseous: Multiple nondisplaced facial fractures on the right. Nondisplaced fracture of the zygomatic arch. Nondisplaced fractures the lateral wall of the orbit extending back to the orbital apex. Nondisplaced fracture of the lamina papyracea/medial orbital wall. The fracture extends into the sphenoid bone along the greater wing of the sphenoid, affecting the skull base. Fracture extends laterally into the right frontal bone. Orbits: No evidence of intraorbital soft tissue injury. Extensive subcutaneous emphysema/orbital emphysema on the right. Sinuses: Some layering fluid  in the left division of the sphenoid sinus. Chronic mucosal inflammatory changes and previous functional endoscopic sinus surgery. Soft tissues: Extensive air in the soft tissues as noted above. Intracranial air as noted above. CT CERVICAL SPINE FINDINGS Alignment: No traumatic malalignment. Straightening of the normal cervical lordosis. Chronic degenerative anterolisthesis at C5-6 and C6-7. Skull base and vertebrae: No fracture.  See below. Soft tissues and spinal canal: Negative Disc levels: Chronic degenerative arthropathy at the C1-2 articulation. No encroachment upon the spinal canal. Chronic fusion from C2 through C5. Sufficient patency of the canal and foramina. C5-6 and C6-7 show chronic degenerative spondylosis and facet arthropathy with anterolisthesis of 4 mm. No likely  compressive central canal stenosis. Bilateral foraminal narrowing left worse than right could cause exiting nerve root compression. C7-T1 shows facet osteoarthritis but no stenosis. Upper chest: Negative Other: None IMPRESSION: Head CT: Right-sided facial and skull base fractures as outlined above. None of these are significantly displaced. Fracture of the greater wing of the sphenoid results in pneumocephalus. No evidence of subdural or epidural hematoma. Small amount of subarachnoid blood layering dependently in the occipital horns of lateral ventricles. Brain atrophy and chronic small-vessel ischemic changes otherwise. Cervical spine CT: No acute or traumatic finding. Chronic cervical fusion C2 through C5 with degenerative spondylosis and facet arthropathy at C5-6 and C6-7. Maxillofacial CT: Nondisplaced fractures of the zygomatic arch on the right, the lateral orbital wall, the medial orbital wall, the greater wing of the sphenoid in the right frontal bone. Pneumocephalus secondary to the greater wing sphenoid fracture as noted above. Extensive subcutaneous emphysema and orbital emphysema. Electronically Signed   By: Paulina Fusi M.D.   On: 11/13/2018 11:27     ____________________________________________   PROCEDURES  Procedure(s) performed: None Procedures Critical Care performed: yes  CRITICAL CARE Performed by: Nita Sickle  ?  Total critical care time: 35 min  Critical care time was exclusive of separately billable procedures and treating other patients.  Critical care was necessary to treat or prevent imminent or life-threatening deterioration.  Critical care was time spent personally by me on the following activities: development of treatment plan with patient and/or surrogate as well as nursing, discussions with consultants, evaluation of patient's response to treatment, examination of patient, obtaining history from patient or surrogate, ordering and performing treatments and  interventions, ordering and review of laboratory studies, ordering and review of radiographic studies, pulse oximetry and re-evaluation of patient's condition.  ____________________________________________   INITIAL IMPRESSION / ASSESSMENT AND PLAN / ED COURSE  85 y.o. female with history of osteoporosis, dementia, hypothyroidism, peptic ulcer disease who presents for evaluation of unwitnessed fall, unknown downtime.  Patient with bruising to the right side of her face.  No other obvious signs of trauma on physical and history.  EMS initially reported a left wrist deformity.  No obvious deformity seen on exam here with a negative x-ray.  CT head face and neck are pending.  Will give Tylenol for pain.  Will check labs to rule out rhabdo, AKI, dehydration, anemia, electrolyte abnormalities as possible causes of patient falls/syncope.  EKG showing no acute ischemia or dysrhythmias.    _________________________ 11:49 AM on 11/13/2018 -----------------------------------------  CT concerning for traumatic subarachnoid hemorrhage, right-sided basilar skull fracture, pneumocephalus, right zygomatic arch fracture, several right orbital fractures, right frontal bone sphenoid fracture.  Patient remains with a GCS of 14.  Discussed with [facility], Dr. Arliss Journey who accepted patient as a yellow trauma transfer.  Patient's husband and daughter are at bedside  and were updated of the findings and need for transfer.  They are in agreement with the plan.  Her labs otherwise did not show any acute abnormalities.   As part of my medical decision making, I reviewed the following data within the electronic MEDICAL RECORD NUMBER Nursing notes reviewed and incorporated, Labs reviewed , Radiograph reviewed , A consult was requested and obtained from this/these consultant(s) [facility] Trauma, Notes from prior ED visits and [place] Controlled Substance Database    Pertinent labs & imaging results that were available during my care of the  patient were reviewed by me and considered in my medical decision making (see chart for details).    ____________________________________________   FINAL CLINICAL IMPRESSION(S) / ED DIAGNOSES  Final diagnoses:  Fall, initial encounter  Closed fracture of right side of base of skull, initial encounter (HCC)  SAH (subarachnoid hemorrhage) (HCC)  Closed extensive facial fractures, initial encounter (HCC)      NEW MEDICATIONS STARTED DURING THIS VISIT:  ED Discharge Orders    None       Note:  This document was prepared using Dragon voice recognition software and may include unintentional dictation errors.    Don Perking, [place], MD 11/13/18 1500

## 2018-11-13 NOTE — ED Triage Notes (Signed)
Pt arrived via [facility] from home d/t fall last night. Pt fell and was able to make it to her bed, get changed, and back to her recliner. Family found pt in the recliner this AM. Pt A&O to person and place. Right eye bruise with pooling of blood in the right jaw with a left wrist deformity.

## 2018-11-13 NOTE — ED Notes (Signed)
Patient transported to CT 

## 2018-11-13 NOTE — ED Notes (Signed)
EMTALA REVIEWED 

## 2018-11-13 NOTE — ED Notes (Signed)
[facility]  CALLED PER  DR  MD

## 2018-12-04 ENCOUNTER — Emergency Department
Admission: EM | Admit: 2018-12-04 | Discharge: 2018-12-04 | Disposition: A | Discharge status: Skilled Nursing Facility | Payer: Medicare Other | Attending: Emergency Medicine | Admitting: Emergency Medicine

## 2018-12-04 ENCOUNTER — Other Ambulatory Visit: Payer: Self-pay

## 2018-12-04 ENCOUNTER — Emergency Department: Payer: Medicare Other

## 2018-12-04 ENCOUNTER — Telehealth: Payer: Self-pay | Admitting: Neurology

## 2018-12-04 DIAGNOSIS — H539 Unspecified visual disturbance: Secondary | ICD-10-CM | POA: Diagnosis not present

## 2018-12-04 DIAGNOSIS — N3001 Acute cystitis with hematuria: Secondary | ICD-10-CM | POA: Insufficient documentation

## 2018-12-04 DIAGNOSIS — E039 Hypothyroidism, unspecified: Secondary | ICD-10-CM | POA: Insufficient documentation

## 2018-12-04 DIAGNOSIS — Z87891 Personal history of nicotine dependence: Secondary | ICD-10-CM | POA: Insufficient documentation

## 2018-12-04 DIAGNOSIS — Z79899 Other long term (current) drug therapy: Secondary | ICD-10-CM | POA: Diagnosis not present

## 2018-12-04 DIAGNOSIS — H538 Other visual disturbances: Secondary | ICD-10-CM | POA: Diagnosis present

## 2018-12-04 DIAGNOSIS — N390 Urinary tract infection, site not specified: Secondary | ICD-10-CM

## 2018-12-04 DIAGNOSIS — R319 Hematuria, unspecified: Secondary | ICD-10-CM

## 2018-12-04 LAB — CBC WITH DIFFERENTIAL/PLATELET
Abs Immature Granulocytes: 0.04 10*3/uL (ref 0.00–0.07)
Basophils Absolute: 0.1 10*3/uL (ref 0.0–0.1)
Basophils Relative: 1 %
Eosinophils Absolute: 0.2 10*3/uL (ref 0.0–0.5)
Eosinophils Relative: 3 %
HEMATOCRIT: 31.6 % — AB (ref 36.0–46.0)
Hemoglobin: 10.1 g/dL — ABNORMAL LOW (ref 12.0–15.0)
Immature Granulocytes: 1 %
LYMPHS ABS: 1.5 10*3/uL (ref 0.7–4.0)
Lymphocytes Relative: 21 %
MCH: 31.4 pg (ref 26.0–34.0)
MCHC: 32 g/dL (ref 30.0–36.0)
MCV: 98.1 fL (ref 80.0–100.0)
Monocytes Absolute: 0.8 10*3/uL (ref 0.1–1.0)
Monocytes Relative: 12 %
Neutro Abs: 4.4 10*3/uL (ref 1.7–7.7)
Neutrophils Relative %: 62 %
Platelets: 310 10*3/uL (ref 150–400)
RBC: 3.22 MIL/uL — ABNORMAL LOW (ref 3.87–5.11)
RDW: 13.1 % (ref 11.5–15.5)
WBC: 6.9 10*3/uL (ref 4.0–10.5)
nRBC: 0 % (ref 0.0–0.2)

## 2018-12-04 LAB — URINALYSIS, COMPLETE (UACMP) WITH MICROSCOPIC
BACTERIA UA: NONE SEEN
Bilirubin Urine: NEGATIVE
Glucose, UA: NEGATIVE mg/dL
Ketones, ur: NEGATIVE mg/dL
Nitrite: NEGATIVE
Protein, ur: 30 mg/dL — AB
RBC / HPF: >50 RBC/hpf — ABNORMAL HIGH (ref 0–5)
Specific Gravity, Urine: 1.023 (ref 1.005–1.030)
WBC, UA: >50 WBC/hpf — ABNORMAL HIGH (ref 0–5)
pH: 5 (ref 5.0–8.0)

## 2018-12-04 LAB — COMPREHENSIVE METABOLIC PANEL
ALT: 19 U/L (ref 0–44)
AST: 16 U/L (ref 15–41)
Albumin: 3.9 g/dL (ref 3.5–5.0)
Alkaline Phosphatase: 65 U/L (ref 38–126)
Anion gap: 7 (ref 5–15)
BUN: 18 mg/dL (ref 8–23)
CO2: 29 mmol/L (ref 22–32)
Calcium: 8.6 mg/dL — ABNORMAL LOW (ref 8.9–10.3)
Chloride: 103 mmol/L (ref 98–111)
Creatinine, Ser: 0.53 mg/dL (ref 0.44–1.00)
GFR calc Af Amer: >60 mL/min (ref >60)
GFR calc non Af Amer: >60 mL/min (ref >60)
GLUCOSE: 90 mg/dL (ref 70–99)
Potassium: 3.7 mmol/L (ref 3.5–5.1)
SODIUM: 139 mmol/L (ref 135–145)
Total Bilirubin: 0.3 mg/dL (ref 0.3–1.2)
Total Protein: 7 g/dL (ref 6.5–8.1)

## 2018-12-04 LAB — CARBAMAZEPINE LEVEL, TOTAL: CARBAMAZEPINE LVL: 6.5 ug/mL (ref 4.0–12.0)

## 2018-12-04 MED ORDER — SODIUM CHLORIDE 0.9 % IV SOLN
1.0000 g | Freq: Once | INTRAVENOUS | Status: AC
  Administered 2018-12-04: 1 g via INTRAVENOUS
  Filled 2018-12-04: qty 10

## 2018-12-04 MED ORDER — CEPHALEXIN 500 MG PO CAPS: 500.0000 mg | ORAL_CAPSULE | Freq: Three times a day (TID) | ORAL | 0 refills | Status: AC

## 2018-12-04 NOTE — Consult Note (Addendum)
Reason for Consult: Altered mental status Referring Physician: Arnaldo Natal, MD  CC: Altered mental status, blurred vision  HPI: Suzanne Russo is an 85 y.o. female with past medical history of trigeminal neuralgia, dementia, multiple falls, hypothyroidism, DVT and recent traumatic fall presenting to the ED from [facility] with complaints of blurred vision and altered mental status.  Patient was recently admitted at [facility] on 11/13/2018 following a traumatic fall with resultant zygomatic arch fracture, traumatic subarachnoid hemorrhage, and orbital floor fracture.  She was evaluated by neurosurgery and ophthalmology at [facility] and was treated with IV antibiotics with no surgical intervention required and discharged to rehab.  Patient reports she has had vision problems since the fall however per family members they had not noticed the vision problems since discharge until 12/01/2018. Family report they noticed that she was having difficulty reading words or numbers and mental status was also altered from baseline.  Patient's daughter reports that for the past 4 days patient has been confused and disoriented more so towards evening. On arrival to the ED, she was afebrile with blood pressure 126/73 mm Hg and pulse rate 72 beats/min. There were no focal neurological deficits; she was alert and oriented x4. ECG showed sinus rhythm of 51 beats per minute and the chest X-ray showed right seventh and eighth rib fracture deformities. Complete blood count (CBC), comprehensive metabolic panel (CMP) and drug of abuse screen were unremarkable other than urinalysis which showed acute UTI.   A non-contrast head CT showed stable appearance of minimally displaced fractures involving anterior portion of right middle cranial fossa in right lateral orbital wall. MRI brain pending.  Past Medical History:  Diagnosis Date  . Carpal tunnel syndrome   . Concussion with no loss of consciousness 08/26/2015  . Degenerative  arthritis   . DVT (deep venous thrombosis) (HCC)   . GERD (gastroesophageal reflux disease)   . Hemorrhoids   . Hypothyroidism   . Memory difficulty 07/16/2014  . Occipital neuralgia   . Occipital neuralgia of left side 12/15/2014  . Osteoporosis   . Pelvic fracture (HCC)   . Peptic ulcer disease   . Right clavicle fracture   . Trigeminal neuralgia   . Venous insufficiency     Past Surgical History:  Procedure Laterality Date  . bilateral cataract surgery    . bilateral knee surgery    . left hip fracture    . left thumb surgery    . left toe surgery    . NASAL SINUS SURGERY    . TONSILLECTOMY    . VAGINAL HYSTERECTOMY      Family History  Problem Relation Age of Onset  . Heart disease Mother   . COPD Father   . Congestive Heart Failure Brother     Social History:  reports that she has quit smoking. Her smoking use included cigarettes. She has a 45.00 pack-year smoking history. She has never used smokeless tobacco. She reports current alcohol use of about 7.0 standard drinks of alcohol per week. She reports that she does not use drugs.  Allergies  Allergen Reactions  . Aricept [Donepezil]     GI upset  . Citalopram Other (See Comments)    Caused bad dreams  . Sulfa Antibiotics Rash  . Tetracycline Rash    Medications: I have reviewed the patient's current medications. Prior to Admission: (Not in a hospital admission)  Prior to Admission medications   Medication Sig Start Date End Date Taking? Authorizing Provider  acetaminophen (  TYLENOL) 500 MG tablet Take 500 mg by mouth every 8 (eight) hours as needed.    [provider]  Biotin 300 MCG TABS Take 1 tablet (300 mcg total) by mouth daily. 03/20/17   Katharina Caper, MD  Calcium Carb-Cholecalciferol (OS-CAL CALCIUM + D3 PO) Take 1 capsule by mouth 2 (two) times daily.    [provider]  carbamazepine (CARBATROL) 200 MG 12 hr capsule Take 1 capsule (200 mg total) by mouth 2 (two) times daily. 01/03/18    York Spaniel, MD  carbamazepine (CARBATROL) 300 MG 12 hr capsule Take 1 capsule (300 mg total) by mouth 2 (two) times daily. 05/02/18   York Spaniel, MD  celecoxib (CELEBREX) 200 MG capsule Take 200 mg by mouth daily. 08/19/15   [provider]  Cyanocobalamin (B-12 PO) Take 1 tablet by mouth daily.     [provider]  hydrocortisone (ANUSOL-HC) 25 MG suppository Place 25 mg rectally as needed.    [provider]  hydrocortisone (ANUSOL-HC) 25 MG suppository Place 1 suppository rectally 2 (two) times daily.    [provider]  levothyroxine (SYNTHROID, LEVOTHROID) 100 MCG tablet Take 100 mcg by mouth daily before breakfast.    [provider]  loratadine-pseudoephedrine (CLARITIN-D 24-HOUR) 10-240 MG 24 hr tablet Take 1 tablet by mouth daily.    [provider]  meclizine (ANTIVERT) 25 MG tablet Take 25 mg by mouth every 6 (six) hours as needed.    [provider]  Omega-3 Fatty Acids (FISH OIL) 1000 MG CAPS Take 1,000 mg by mouth daily.     [provider]  omeprazole (PRILOSEC) 20 MG capsule Take 20 mg by mouth daily.    [provider]  Potassium Gluconate 550 MG TABS Take 1 tablet by mouth every evening.    [provider]  triamcinolone cream (KENALOG) 0.1 % Apply 1 application topically 2 (two) times daily. Compounded with eucerin    [provider]  vitamin E 400 UNIT capsule Take 400 Units by mouth daily.    [provider]  zolendronic acid (ZOMETA) 4 MG/5ML injection Inject 4 mg into the vein once. Last received inj 11/07/17    [provider]    Scheduled:  ROS: Unable to obtain from patient due to current altered mental status.  Physical Examination: Blood pressure (!) 144/72, pulse 68, temperature 97.9 F (36.6 C), temperature source Oral, resp. rate 15, height  (1.626 m), weight 49.9 kg, SpO2 100 %.  HEENT-  Normocephalic, no lesions, without  obvious abnormality.  Normal external eye and conjunctiva.  Normal TM's bilaterally.  Normal auditory canals and external ears. Normal external nose, mucus membranes and septum.  Normal pharynx. Cardiovascular- S1, S2 normal, pulses palpable throughout   Lungs- chest clear, no wheezing, rales, normal symmetric air entry Abdomen- soft, non-tender; bowel sounds normal; no masses,  no organomegaly Extremities- no edema Lymph-no adenopathy palpable Musculoskeletal-no joint tenderness, deformity or swelling Skin-warm and dry, no hyperpigmentation, vitiligo, or suspicious lesions  Neurological Exam   Mental Status: Alert, oriented, thought content appropriate.  Speech fluent without evidence of aphasia.  Able to follow 3 step commands without difficulty. Attention span and concentration seemed appropriate  Cranial Nerves: II: Discs flat bilaterally; Visual fields cuts bilaterally right>left, pupils equal, round, reactive to light and accommodation III,IV, VI: ptosis not present, extra-ocular motions intact bilaterally V,VII: smile symmetric, facial light touch sensation intact VIII: hearing normal bilaterally IX,X: gag reflex present XI: bilateral shoulder shrug  XII: midline tongue extension Motor: Right :  Upper extremity   5/5 Without pronator drift      Left: Upper extremity   5/5 without pronator drift Right:   Lower extremity   4+/5                                          Left: Lower extremity   4+/5 Tone and bulk:normal tone throughout; no atrophy noted Sensory: Pinprick and light touch intact bilaterally Deep Tendon Reflexes: 2+ and symmetric throughout Plantars: Right: mute                              Left: mute Cerebellar: Unable to assess patient not co-operative with exam Gait: not tested due to safety concerns  Data Reviewed  Laboratory Studies:   Basic Metabolic Panel: Recent Labs  Lab 12/04/18 1116  NA 139  K 3.7  CL 103  CO2 29  GLUCOSE 90  BUN 18  CREATININE  0.53  CALCIUM 8.6*    Liver Function Tests: Recent Labs  Lab 12/04/18 1116  AST 16  ALT 19  ALKPHOS 65  BILITOT 0.3  PROT 7.0  ALBUMIN 3.9   No results for input(s): LIPASE, AMYLASE in the last 168 hours. No results for input(s): AMMONIA in the last 168 hours.  CBC: Recent Labs  Lab 12/04/18 1116  WBC 6.9  NEUTROABS 4.4  HGB 10.1*  HCT 31.6*  MCV 98.1  PLT 310    Cardiac Enzymes: No results for input(s): CKTOTAL, CKMB, CKMBINDEX, TROPONINI in the last 168 hours.  BNP: Invalid input(s): POCBNP  CBG: No results for input(s): GLUCAP in the last 168 hours.  Microbiology: No results found for this or any previous visit.  Coagulation Studies: No results for input(s): LABPROT, INR in the last 72 hours.  Urinalysis:  Recent Labs  Lab 12/04/18 1146  COLORURINE YELLOW*  LABSPEC 1.023  PHURINE 5.0  GLUCOSEU NEGATIVE  HGBUR MODERATE*  BILIRUBINUR NEGATIVE  KETONESUR NEGATIVE  PROTEINUR 30*  NITRITE NEGATIVE  LEUKOCYTESUR LARGE*    Lipid Panel:  No results found for: CHOL, TRIG, HDL, CHOLHDL, VLDL, LDLCALC  HgbA1C: No results found for: HGBA1C  Urine Drug Screen:  No results found for: LABOPIA, COCAINSCRNUR, LABBENZ, AMPHETMU, THCU, LABBARB  Alcohol Level: No results for input(s): ETH in the last 168 hours.  Other results: EKG: there are no previous tracings available for comparison.  Imaging: Dg Chest 2 View  Result Date: 12/04/2018 CLINICAL DATA:  /o blurred vision, initially sounded new onset however pt states this has been an issue since she fell. Blurred vision is mostly in right eye, pt able to see fingers and determine how many fingers. EXAM: CHEST - 2 VIEW COMPARISON:  03/18/2017 FINDINGS: Lungs are clear. Heart size upper limits normal. Aortic Atherosclerosis (ICD10-170.0). No effusion. No pneumothorax. Fracture deformities of posterolateral aspect right seventh and eighth ribs, new since previous. Surgical clips and staples in the left upper  abdomen. IMPRESSION: Right seventh and eighth rib fracture deformities, new since 03/18/2017. No pneumothorax. Electronically Signed   By: Corlis Leak M.D.   On: 12/04/2018 12:32   Ct Head Wo Contrast  Result Date: 12/04/2018 CLINICAL DATA:  Blurred vision, altered level consciousness. EXAM: CT HEAD WITHOUT CONTRAST TECHNIQUE: Contiguous axial images were obtained from the base of the skull through the vertex  without intravenous contrast. COMPARISON:  CT scan of November 13, 2018. FINDINGS: Brain: Mild diffuse cortical atrophy is noted. Mild chronic ischemic white matter disease is noted. No mass effect or midline shift is noted. Ventricular size is within normal limits. There is no evidence of mass lesion, hemorrhage or acute infarction. Vascular: No hyperdense vessel or unexpected calcification. Skull: Stable appearance of minimally displaced fracture involving anterior portion of right middle cranial fossa and right lateral orbital wall. No new fractures are noted. Sinuses/Orbits: No acute finding. Other: None. IMPRESSION: Stable appearance of minimally displaced fractures involving anterior portion of right middle cranial fossa in right lateral orbital wall. Mild diffuse cortical atrophy. Mild chronic ischemic white matter disease. No acute intracranial abnormality seen. Electronically Signed   By: Lupita Raider, M.D.   On: 12/04/2018 12:30   Patient seen and examined.  Clinical course and management discussed.  Necessary edits performed.  I agree with the above.  Assessment and plan of care developed and discussed below.    Assessment: 85 y.o female with past medical history of trigeminal neuralgia, dementia, multiple falls, hypothyroidism, DVT and recent traumatic fall presenting to the ED from [facility] with complaints of blurred vision and altered mental status. CT head reviewed and shows stable appearance of minimally displaced fractures involving anterior portion of right middle cranial fossa  in right lateral orbital wall. No new acute intracranial abnormality. Blurred vision appears unchanged when records from recent, previous [facility] admission for traumatic brain injury reviewed. Altered mental status likely multifactorial and related to current infection (UTI), recent significant head injury and institutionalization superimposed on an underlying dementia.     Plan: 1. MRI brain without contrast.  If no acute changes noted, no further neurological investigations recommended at this time. 2. Check Tegretol level 3. Agree with treatment of current underlying medical condition. 4. Patient to follow up with ophthalmology.  Per report of family patient already has an outpatient appointment scheduled.    This patient was staffed with Dr. Verlon Au, Thad Ranger who personally evaluated patient, reviewed documentation and agreed with assessment and plan of care as above.  Webb Silversmith, DNP, FNP-BC Board certified Nurse Practitioner Neurology Department   12/04/2018, 1:20 PM   Case discussed with Dr. Jory Ee, MD Neurology [phone]  12/04/2018  3:01 PM

## 2018-12-04 NOTE — Discharge Instructions (Signed)
Take the Keflex antibiotic 1 pill 3 times a day to treat the UTI.  Please be sure to follow-up with ophthalmology to check on your vision.  Please return here for any worsening at all.  Please also have your regular doctor keep a close eye on you and see you in the next 2 to 3 days.

## 2018-12-04 NOTE — ED Provider Notes (Signed)
[facility] Department Provider Note   ____________________________________________   First MD Initiated Contact with Patient 12/04/18 1120     (approximate)  I have reviewed the triage vital signs and the nursing notes.   HISTORY  Chief Complaint Blurred Vision    HPI Suzanne Russo is a 85 y.o. female who comes from nursing home where she is for rehab.  She had previously fallen gotten intracranial hemorrhage.  Patient reports her vision has been blurry since the fall.  Family today reports that they tried to get her to read and she was having difficulty recognizing letters.  She could recognize things but not letters.  They also said she is not been acting like herself.  She was not wearing her underwear that she has worn for 20 years on her pajamas.  She did not put her toothbrush back or was supposed to be in was saying somebody else did it and several other things.  At any rate she is not acting like herself.  I consulted neurology who came down to see her they do not feel the vision problem is anything acute and her MRI shows no acute changes.  She is stable neurologically.  She does have a very bad UTI.  Currently and her whole visit here she is not been confused or disoriented at all.  I have given her some Rocephin.  I will let her go on some Keflex.  She will follow-up with her regular doctor and family will keep a close eye on her and bring her back if she has any further problems.   Past Medical History:  Diagnosis Date  . Carpal tunnel syndrome   . Concussion with no loss of consciousness 08/26/2015  . Degenerative arthritis   . DVT (deep venous thrombosis) (HCC)   . GERD (gastroesophageal reflux disease)   . Hemorrhoids   . Hypothyroidism   . Memory difficulty 07/16/2014  . Occipital neuralgia   . Occipital neuralgia of left side 12/15/2014  . Osteoporosis   . Pelvic fracture (HCC)   . Peptic ulcer disease   . Right clavicle fracture    . Trigeminal neuralgia   . Venous insufficiency     Patient Active Problem List   Diagnosis Date Noted  . Closed fracture of body of sternum 03/20/2017  . Anemia 03/20/2017  . Generalized weakness 03/20/2017  . Pleuritic chest pain 03/20/2017  . Hypothyroidism 03/20/2017  . Rib fractures 03/18/2017  . Chronic venous insufficiency 03/14/2017  . Ankle ulcer, left, limited to breakdown of skin (HCC) 01/03/2017  . Cellulitis of lower extremity 11/14/2016  . Swelling of limb 11/14/2016  . Lymphedema 11/14/2016  . Headache disorder 08/26/2015  . Concussion with no loss of consciousness 08/26/2015  . Occipital neuralgia of left side 12/15/2014  . Memory difficulty 07/16/2014  . Trigeminal neuralgia 07/11/2013    Past Surgical History:  Procedure Laterality Date  . bilateral cataract surgery    . bilateral knee surgery    . left hip fracture    . left thumb surgery    . left toe surgery    . NASAL SINUS SURGERY    . TONSILLECTOMY    . VAGINAL HYSTERECTOMY      Prior to Admission medications   Medication Sig Start Date End Date Taking? Authorizing Provider  acetaminophen (TYLENOL) 500 MG tablet Take 500 mg by mouth every 8 (eight) hours as needed.    [provider]  Biotin 300 MCG TABS Take  1 tablet (300 mcg total) by mouth daily. 03/20/17   Katharina Caper, MD  Calcium Carb-Cholecalciferol (OS-CAL CALCIUM + D3 PO) Take 1 capsule by mouth 2 (two) times daily.    [provider]  carbamazepine (CARBATROL) 200 MG 12 hr capsule Take 1 capsule (200 mg total) by mouth 2 (two) times daily. 01/03/18   York Spaniel, MD  carbamazepine (CARBATROL) 300 MG 12 hr capsule Take 1 capsule (300 mg total) by mouth 2 (two) times daily. 05/02/18   York Spaniel, MD  celecoxib (CELEBREX) 200 MG capsule Take 200 mg by mouth daily. 08/19/15   [provider]  Cyanocobalamin (B-12 PO) Take 1 tablet by mouth daily.     [provider]  hydrocortisone  (ANUSOL-HC) 25 MG suppository Place 25 mg rectally as needed.    [provider]  hydrocortisone (ANUSOL-HC) 25 MG suppository Place 1 suppository rectally 2 (two) times daily.    [provider]  levothyroxine (SYNTHROID, LEVOTHROID) 100 MCG tablet Take 100 mcg by mouth daily before breakfast.    [provider]  loratadine-pseudoephedrine (CLARITIN-D 24-HOUR) 10-240 MG 24 hr tablet Take 1 tablet by mouth daily.    [provider]  meclizine (ANTIVERT) 25 MG tablet Take 25 mg by mouth every 6 (six) hours as needed.    [provider]  Omega-3 Fatty Acids (FISH OIL) 1000 MG CAPS Take 1,000 mg by mouth daily.     [provider]  omeprazole (PRILOSEC) 20 MG capsule Take 20 mg by mouth daily.    [provider]  Potassium Gluconate 550 MG TABS Take 1 tablet by mouth every evening.    [provider]  triamcinolone cream (KENALOG) 0.1 % Apply 1 application topically 2 (two) times daily. Compounded with eucerin    [provider]  vitamin E 400 UNIT capsule Take 400 Units by mouth daily.    [provider]  zolendronic acid (ZOMETA) 4 MG/5ML injection Inject 4 mg into the vein once. Last received inj 11/07/17    [provider]    Allergies Aricept [donepezil]; Citalopram; Sulfa antibiotics; and Tetracycline  Family History  Problem Relation Age of Onset  . Heart disease Mother   . COPD Father   . Congestive Heart Failure Brother     Social History Social History   Tobacco Use  . Smoking status: Former Smoker    Packs/day: 1.00    Years: 45.00    Pack years: 45.00    Types: Cigarettes  . Smokeless tobacco: Never Used  Substance Use Topics  . Alcohol use: Yes    Alcohol/week: 7.0 standard drinks    Types: 7 Glasses of wine per week    Comment: wine on occasion  . Drug use: No    Review of Systems  Constitutional: No fever/chills Eyes: No visual changes. ENT: No sore  throat. Cardiovascular: Denies chest pain. Respiratory: Denies shortness of breath. Gastrointestinal: No abdominal pain.  No nausea, no vomiting.  No diarrhea.  No constipation. Genitourinary: Negative for dysuria. Musculoskeletal: Negative for back pain. Skin: Negative for rash. Neurological: Negative for headaches, focal weakness  ____________________________________________   PHYSICAL EXAM:  VITAL SIGNS: ED Triage Vitals  Enc Vitals Group     BP 12/04/18 1122 126/73     Pulse Rate 12/04/18 1122 72     Resp 12/04/18 1122 15     Temp 12/04/18 1122 97.9 F (36.6 C)     Temp Source 12/04/18 1122 Oral  SpO2 12/04/18 1118 99 %     Weight 12/04/18 1124 110 lb (49.9 kg)     Height 12/04/18 1123  (1.626 m)     Head Circumference --      Peak Flow --      Pain Score 12/04/18 1123 5     Pain Loc --      Pain Edu? --      Excl. in GC? --     Constitutional: Alert and oriented. Well appearing and in no acute distress. Eyes: Conjunctivae are normal. PER. EOMI. fundi show no obvious pathology although it somewhat difficult to visualize head: Atraumatic. Nose: No congestion/rhinnorhea. Mouth/Throat: Mucous membranes are moist.  Oropharynx non-erythematous. Neck: No stridor.  Cardiovascular: Normal rate, regular rhythm. Grossly normal heart sounds.  Good peripheral circulation. Respiratory: Normal respiratory effort.  No retractions. Lungs CTAB. Gastrointestinal: Soft and nontender. No distention. No abdominal bruits. No CVA tenderness. Musculoskeletal: No lower extremity tenderness nor edema.  No joint effusions. Neurologic:  Normal speech and language. No gross focal neurologic deficits are appreciated.  Cranial nerves II through XII are intact visual fields were not checked by me.  Finger-to-nose and rapid alternating movements and hands are normal motor strength is 5/5 throughout patient does not report any numbness. Skin:  Skin is warm, dry and intact. No rash  noted. Psychiatric: Mood and affect are normal. Speech and behavior are normal.  ____________________________________________   LABS (all labs ordered are listed, but only abnormal results are displayed)  Labs Reviewed  COMPREHENSIVE METABOLIC PANEL - Abnormal; Notable for the following components:      Result Value   Calcium 8.6 (*)    All other components within normal limits  CBC WITH DIFFERENTIAL/PLATELET - Abnormal; Notable for the following components:   RBC 3.22 (*)    Hemoglobin 10.1 (*)    HCT 31.6 (*)    All other components within normal limits  URINALYSIS, COMPLETE (UACMP) WITH MICROSCOPIC - Abnormal; Notable for the following components:   Color, Urine YELLOW (*)    APPearance CLOUDY (*)    Hgb urine dipstick MODERATE (*)    Protein, ur 30 (*)    Leukocytes,Ua LARGE (*)    RBC / HPF >50 (*)    WBC, UA >50 (*)    All other components within normal limits  CARBAMAZEPINE LEVEL, TOTAL  URINE DRUG SCREEN, QUALITATIVE ([facility] ONLY)   ____________________________________________  EKG  EKG read interpreted by me shows sinus bradycardia rate of 57 patient has first-degree AV block right bundle branch block and left anterior hemiblock.  Right bundle branch block and left anterior hemiblock were present in 2018.  No ST segment elevation or depression seen. ____________________________________________  RADIOLOGY  ED MD interpretation: Chest x-ray shows some rib fractures which likely resulted from her fall pain is been in that area since the fall.  Head CT shows no acute findings as does the MRI.  I reviewed all the films.  Official radiology report(s): Dg Chest 2 View  Result Date: 12/04/2018 CLINICAL DATA:  /o blurred vision, initially sounded new onset however pt states this has been an issue since she fell. Blurred vision is mostly in right eye, pt able to see fingers and determine how many fingers. EXAM: CHEST - 2 VIEW COMPARISON:  03/18/2017 FINDINGS: Lungs are clear.  Heart size upper limits normal. Aortic Atherosclerosis (ICD10-170.0). No effusion. No pneumothorax. Fracture deformities of posterolateral aspect right seventh and eighth ribs, new since previous. Surgical clips and staples in  the left upper abdomen. IMPRESSION: Right seventh and eighth rib fracture deformities, new since 03/18/2017. No pneumothorax. Electronically Signed   By: Corlis Leak M.D.   On: 12/04/2018 12:32   Ct Head Wo Contrast  Result Date: 12/04/2018 CLINICAL DATA:  Blurred vision, altered level consciousness. EXAM: CT HEAD WITHOUT CONTRAST TECHNIQUE: Contiguous axial images were obtained from the base of the skull through the vertex without intravenous contrast. COMPARISON:  CT scan of November 13, 2018. FINDINGS: Brain: Mild diffuse cortical atrophy is noted. Mild chronic ischemic white matter disease is noted. No mass effect or midline shift is noted. Ventricular size is within normal limits. There is no evidence of mass lesion, hemorrhage or acute infarction. Vascular: No hyperdense vessel or unexpected calcification. Skull: Stable appearance of minimally displaced fracture involving anterior portion of right middle cranial fossa and right lateral orbital wall. No new fractures are noted. Sinuses/Orbits: No acute finding. Other: None. IMPRESSION: Stable appearance of minimally displaced fractures involving anterior portion of right middle cranial fossa in right lateral orbital wall. Mild diffuse cortical atrophy. Mild chronic ischemic white matter disease. No acute intracranial abnormality seen. Electronically Signed   By: Lupita Raider, M.D.   On: 12/04/2018 12:30   Mr Brain Wo Contrast  Result Date: 12/04/2018 CLINICAL DATA:  Blurred vision. EXAM: MRI HEAD WITHOUT CONTRAST TECHNIQUE: Multiplanar, multiecho pulse sequences of the brain and surrounding structures were obtained without intravenous contrast. COMPARISON:  Head CT 12/04/2018 FINDINGS: Brain: There is no evidence of acute  infarct, intracranial hemorrhage, mass, midline shift, or extra-axial fluid collection. There is moderate cerebral atrophy. Confluent T2 hyperintensities in the cerebral white matter bilaterally are chronic based on multiple prior CTs and are nonspecific but compatible with severe chronic small vessel ischemic disease. There are small chronic infarcts in the cerebellum bilaterally. Vascular: Major intracranial vascular flow voids are preserved. Skull and upper cervical spine: Unremarkable bone marrow signal. Prominent C1-2 arthropathy including ligamentous thickening posterior to the dens resulting in a mild impression on the cervicomedullary junction. Multilevel interbody and posterior element ankylosis in the cervical spine. Sinuses/Orbits: Bilateral cataract extraction. Paranasal sinuses and mastoid air cells are clear. Other: None. IMPRESSION: 1. No acute intracranial abnormality. 2. Severe chronic small vessel ischemic disease. Electronically Signed   By: Sebastian Ache M.D.   On: 12/04/2018 14:59    ____________________________________________   PROCEDURES  Procedure(s) performed (including Critical Care):  Procedures   ____________________________________________   INITIAL IMPRESSION / ASSESSMENT AND PLAN / ED COURSE  Patient is awake alert oriented has remained so during her ER visit.  Her carbamazepine level is not elevated.  She does have a fairly bad UTI with greater than 50 RBCs in WBCs in the urine.  I will give her prescription for some Keflex 500 3 times daily.  I have already given her Rocephin IV which should cover her until this time tomorrow.  Family will keep a close check on her.  She will follow-up with ophthalmology and her regular doctor.      ____________________________________________   FINAL CLINICAL IMPRESSION(S) / ED DIAGNOSES  Final diagnoses:  Urinary tract infection with hematuria, site unspecified     ED Discharge Orders    None       Note:  This  document was prepared using Dragon voice recognition software and may include unintentional dictation errors.    Arnaldo Natal, MD 12/04/18 [phone]

## 2018-12-04 NOTE — ED Notes (Signed)
Pt in imaging at this time.

## 2018-12-04 NOTE — Telephone Encounter (Signed)
Pt fell on 2/5 admitted [facility]. Over the past few days she is unable to process reading words or phone numbers. Roddie Mc has been in contact with the PCP last night who is going to contact [facility] rehab today to get MRI ordered in [place]. She needs to know if the patient should be seen sooner than 5/7. Please call to advise

## 2018-12-04 NOTE — Telephone Encounter (Signed)
I contacted the patient and I was able to schedule an earlier f/u for 12/20/18 to discuss a recent fall. Patient is having a MRI done today (12/04/18) and report will be sent to our office once completed.

## 2018-12-04 NOTE — ED Triage Notes (Signed)
Pt tod ED from [facility] via EMS. Pt c/o blurred vision, initially sounded new onset however pt states this has been an issue since she fell. Blurred vision is mostly in right eye, pt able to see fingers and determine how many fingers. PT in good spirits and alert.

## 2018-12-04 NOTE — ED Notes (Signed)
Pt taken to MRI at this time

## 2018-12-06 ENCOUNTER — Other Ambulatory Visit: Payer: Self-pay

## 2018-12-06 ENCOUNTER — Emergency Department: Payer: Medicare Other

## 2018-12-06 ENCOUNTER — Emergency Department
Admission: EM | Admit: 2018-12-06 | Discharge: 2018-12-06 | Disposition: A | Discharge status: Home / Self Care | Payer: Medicare Other | Attending: Emergency Medicine | Admitting: Emergency Medicine

## 2018-12-06 DIAGNOSIS — Z79899 Other long term (current) drug therapy: Secondary | ICD-10-CM | POA: Diagnosis not present

## 2018-12-06 DIAGNOSIS — E039 Hypothyroidism, unspecified: Secondary | ICD-10-CM | POA: Insufficient documentation

## 2018-12-06 DIAGNOSIS — W0110XA Fall on same level from slipping, tripping and stumbling with subsequent striking against unspecified object, initial encounter: Secondary | ICD-10-CM | POA: Insufficient documentation

## 2018-12-06 DIAGNOSIS — W19XXXA Unspecified fall, initial encounter: Secondary | ICD-10-CM

## 2018-12-06 DIAGNOSIS — Y998 Other external cause status: Secondary | ICD-10-CM | POA: Diagnosis not present

## 2018-12-06 DIAGNOSIS — Y92199 Unspecified place in other specified residential institution as the place of occurrence of the external cause: Secondary | ICD-10-CM | POA: Insufficient documentation

## 2018-12-06 DIAGNOSIS — Z87891 Personal history of nicotine dependence: Secondary | ICD-10-CM | POA: Diagnosis not present

## 2018-12-06 DIAGNOSIS — R51 Headache: Secondary | ICD-10-CM | POA: Insufficient documentation

## 2018-12-06 DIAGNOSIS — S4992XA Unspecified injury of left shoulder and upper arm, initial encounter: Secondary | ICD-10-CM | POA: Diagnosis present

## 2018-12-06 DIAGNOSIS — S42202A Unspecified fracture of upper end of left humerus, initial encounter for closed fracture: Secondary | ICD-10-CM | POA: Diagnosis not present

## 2018-12-06 DIAGNOSIS — Y9389 Activity, other specified: Secondary | ICD-10-CM | POA: Insufficient documentation

## 2018-12-06 DIAGNOSIS — M542 Cervicalgia: Secondary | ICD-10-CM | POA: Diagnosis not present

## 2018-12-06 LAB — URINALYSIS, COMPLETE (UACMP) WITH MICROSCOPIC
BACTERIA UA: NONE SEEN
Bilirubin Urine: NEGATIVE
Glucose, UA: NEGATIVE mg/dL
Ketones, ur: NEGATIVE mg/dL
Nitrite: NEGATIVE
Protein, ur: NEGATIVE mg/dL
Specific Gravity, Urine: 1.018 (ref 1.005–1.030)
pH: 5 (ref 5.0–8.0)

## 2018-12-06 LAB — COMPREHENSIVE METABOLIC PANEL
ALT: 17 U/L (ref 0–44)
ANION GAP: 8 (ref 5–15)
AST: 16 U/L (ref 15–41)
Albumin: 3.6 g/dL (ref 3.5–5.0)
Alkaline Phosphatase: 60 U/L (ref 38–126)
BUN: 18 mg/dL (ref 8–23)
CO2: 27 mmol/L (ref 22–32)
Calcium: 8.4 mg/dL — ABNORMAL LOW (ref 8.9–10.3)
Chloride: 105 mmol/L (ref 98–111)
Creatinine, Ser: 0.49 mg/dL (ref 0.44–1.00)
GFR calc Af Amer: >60 mL/min (ref >60)
GFR calc non Af Amer: >60 mL/min (ref >60)
Glucose, Bld: 95 mg/dL (ref 70–99)
Potassium: 3.7 mmol/L (ref 3.5–5.1)
SODIUM: 140 mmol/L (ref 135–145)
Total Bilirubin: 0.6 mg/dL (ref 0.3–1.2)
Total Protein: 6.3 g/dL — ABNORMAL LOW (ref 6.5–8.1)

## 2018-12-06 LAB — TROPONIN I: Troponin I: <0.03 ng/mL (ref <0.03)

## 2018-12-06 LAB — CBC WITH DIFFERENTIAL/PLATELET
Abs Immature Granulocytes: 0.02 10*3/uL (ref 0.00–0.07)
BASOS ABS: 0 10*3/uL (ref 0.0–0.1)
BASOS PCT: 1 %
Eosinophils Absolute: 0.2 10*3/uL (ref 0.0–0.5)
Eosinophils Relative: 4 %
HCT: 28 % — ABNORMAL LOW (ref 36.0–46.0)
Hemoglobin: 8.9 g/dL — ABNORMAL LOW (ref 12.0–15.0)
Immature Granulocytes: 0 %
Lymphocytes Relative: 44 %
Lymphs Abs: 2.6 10*3/uL (ref 0.7–4.0)
MCH: 30.6 pg (ref 26.0–34.0)
MCHC: 31.8 g/dL (ref 30.0–36.0)
MCV: 96.2 fL (ref 80.0–100.0)
Monocytes Absolute: 0.8 10*3/uL (ref 0.1–1.0)
Monocytes Relative: 14 %
NEUTROS PCT: 37 %
NRBC: 0 % (ref 0.0–0.2)
Neutro Abs: 2.1 10*3/uL (ref 1.7–7.7)
PLATELETS: 280 10*3/uL (ref 150–400)
RBC: 2.91 MIL/uL — AB (ref 3.87–5.11)
RDW: 12.8 % (ref 11.5–15.5)
WBC: 5.7 10*3/uL (ref 4.0–10.5)

## 2018-12-06 LAB — CARBAMAZEPINE LEVEL, TOTAL: CARBAMAZEPINE LVL: 9.9 ug/mL (ref 4.0–12.0)

## 2018-12-06 LAB — CK: Total CK: 51 U/L (ref 38–234)

## 2018-12-06 MED ORDER — TRAMADOL HCL 50 MG PO TABS
50.0000 mg | ORAL_TABLET | Freq: Once | ORAL | Status: AC
  Administered 2018-12-06: 50 mg via ORAL
  Filled 2018-12-06: qty 1

## 2018-12-06 MED ORDER — FOSFOMYCIN TROMETHAMINE 3 G PO PACK
3.0000 g | PACK | Freq: Once | ORAL | Status: AC
  Administered 2018-12-06: 3 g via ORAL
  Filled 2018-12-06: qty 3

## 2018-12-06 MED ORDER — ACETAMINOPHEN 325 MG PO TABS
650.0000 mg | ORAL_TABLET | Freq: Once | ORAL | Status: AC
  Administered 2018-12-06: 650 mg via ORAL
  Filled 2018-12-06: qty 2

## 2018-12-06 MED ORDER — TRAMADOL HCL 50 MG PO TABS: 100.0000 mg | ORAL_TABLET | Freq: Four times a day (QID) | ORAL | 0 refills | Status: DC | PRN

## 2018-12-06 NOTE — ED Notes (Signed)
Reviewed discharge instructions, follow-up care, and prescriptions with patient and caregiver from facility. Patient and caregiver verbalized understanding of all information reviewed. Patient stable, with no distress noted at this time.

## 2018-12-06 NOTE — Discharge Instructions (Signed)
Suzanne Russo work-up today was reassuring except for the unfortunate fracture to her left upper arm.  She needs to keep the shoulder immobilizer at all times (or at least as much as possible) because otherwise the bones in her left upper arm (the humerus) will not heal.  Please help her to schedule a follow-up appointment with Dr. Joice Lofts or 1 of his orthopedics colleagues in about 1 week.  She should also follow-up with her primary care provider.  She has a new prescription for tramadol, a pain medication, which she can also discuss with her orthopedic surgeon and primary care provider.  Return to the emergency department if you develop new or worsening symptoms that concern you.

## 2018-12-06 NOTE — ED Provider Notes (Signed)
[facility] Emergency Department Provider Note  ____________________________________________   First MD Initiated Contact with Patient 12/06/18 [phone]     (approximate)  I have reviewed the triage vital signs and the nursing notes.   HISTORY  Chief Complaint Fall and Arm Pain  Level 5 caveat:  history/ROS may be limited by acute altered mental status/delirium versus chronic dementia  HPI Suzanne Russo is a 85 y.o. female who lives at a nursing facility and is brought by EMS for evaluation of an unwitnessed fall.  Reportedly she was found down and is unclear what happened or when she fell.  She is reporting pain in her left shoulder and arm.  She denies headache, neck pain, chest pain, shortness of breath, nausea, vomiting, and abdominal pain.  She also has no pain in her legs at this time.  Moving around makes the arm pain worse and nothing particular makes it better.  She describes the pain as sharp and severe with movement.  She seems to have somewhat of a fluctuating mental status or perhaps sundowning.  She knows the year and the president, where she is and her name, but then she explained that she fell because she was getting ready for a wedding and she got tripped.      Past Medical History:  Diagnosis Date  . Carpal tunnel syndrome   . Concussion with no loss of consciousness 08/26/2015  . Degenerative arthritis   . DVT (deep venous thrombosis) (HCC)   . GERD (gastroesophageal reflux disease)   . Hemorrhoids   . Hypothyroidism   . Memory difficulty 07/16/2014  . Occipital neuralgia   . Occipital neuralgia of left side 12/15/2014  . Osteoporosis   . Pelvic fracture (HCC)   . Peptic ulcer disease   . Right clavicle fracture   . Trigeminal neuralgia   . Venous insufficiency     Patient Active Problem List   Diagnosis Date Noted  . Closed fracture of body of sternum 03/20/2017  . Anemia 03/20/2017  . Generalized weakness 03/20/2017  . Pleuritic  chest pain 03/20/2017  . Hypothyroidism 03/20/2017  . Rib fractures 03/18/2017  . Chronic venous insufficiency 03/14/2017  . Ankle ulcer, left, limited to breakdown of skin (HCC) 01/03/2017  . Cellulitis of lower extremity 11/14/2016  . Swelling of limb 11/14/2016  . Lymphedema 11/14/2016  . Headache disorder 08/26/2015  . Concussion with no loss of consciousness 08/26/2015  . Occipital neuralgia of left side 12/15/2014  . Memory difficulty 07/16/2014  . Trigeminal neuralgia 07/11/2013    Past Surgical History:  Procedure Laterality Date  . bilateral cataract surgery    . bilateral knee surgery    . left hip fracture    . left thumb surgery    . left toe surgery    . NASAL SINUS SURGERY    . TONSILLECTOMY    . VAGINAL HYSTERECTOMY      Prior to Admission medications   Medication Sig Start Date End Date Taking? Authorizing Provider  acetaminophen (TYLENOL) 500 MG tablet Take 500 mg by mouth every 8 (eight) hours as needed.    [provider]  Biotin 300 MCG TABS Take 1 tablet (300 mcg total) by mouth daily. 03/20/17   Katharina Caper, MD  Calcium Carb-Cholecalciferol (OS-CAL CALCIUM + D3 PO) Take 1 capsule by mouth 2 (two) times daily.    [provider]  carbamazepine (CARBATROL) 200 MG 12 hr capsule Take 1 capsule (200 mg total) by mouth 2 (two)  times daily. 01/03/18   York Spaniel, MD  carbamazepine (CARBATROL) 300 MG 12 hr capsule Take 1 capsule (300 mg total) by mouth 2 (two) times daily. 05/02/18   York Spaniel, MD  celecoxib (CELEBREX) 200 MG capsule Take 200 mg by mouth daily. 08/19/15   [provider]  cephALEXin (KEFLEX) 500 MG capsule Take 1 capsule (500 mg total) by mouth 3 (three) times daily for 10 days. 12/04/18 12/14/18  Arnaldo Natal, MD  Cyanocobalamin (B-12 PO) Take 1 tablet by mouth daily.     [provider]  hydrocortisone (ANUSOL-HC) 25 MG suppository Place 25 mg rectally as needed.    [provider]    hydrocortisone (ANUSOL-HC) 25 MG suppository Place 1 suppository rectally 2 (two) times daily.    [provider]  levothyroxine (SYNTHROID, LEVOTHROID) 100 MCG tablet Take 100 mcg by mouth daily before breakfast.    [provider]  loratadine-pseudoephedrine (CLARITIN-D 24-HOUR) 10-240 MG 24 hr tablet Take 1 tablet by mouth daily.    [provider]  meclizine (ANTIVERT) 25 MG tablet Take 25 mg by mouth every 6 (six) hours as needed.    [provider]  Omega-3 Fatty Acids (FISH OIL) 1000 MG CAPS Take 1,000 mg by mouth daily.     [provider]  omeprazole (PRILOSEC) 20 MG capsule Take 20 mg by mouth daily.    [provider]  Potassium Gluconate 550 MG TABS Take 1 tablet by mouth every evening.    [provider]  traMADol (ULTRAM) 50 MG tablet Take 2 tablets (100 mg total) by mouth every 6 (six) hours as needed for moderate pain or severe pain. 12/06/18   Loleta Rose, MD  triamcinolone cream (KENALOG) 0.1 % Apply 1 application topically 2 (two) times daily. Compounded with eucerin    [provider]  vitamin E 400 UNIT capsule Take 400 Units by mouth daily.    [provider]  zolendronic acid (ZOMETA) 4 MG/5ML injection Inject 4 mg into the vein once. Last received inj 11/07/17    [provider]    Allergies Aricept [donepezil]; Citalopram; Sulfa antibiotics; and Tetracycline  Family History  Problem Relation Age of Onset  . Heart disease Mother   . COPD Father   . Congestive Heart Failure Brother     Social History Social History   Tobacco Use  . Smoking status: Former Smoker    Packs/day: 1.00    Years: 45.00    Pack years: 45.00    Types: Cigarettes  . Smokeless tobacco: Never Used  Substance Use Topics  . Alcohol use: Yes    Alcohol/week: 7.0 standard drinks    Types: 7 Glasses of wine per week    Comment: wine on occasion  . Drug use: No    Review of Systems Level 5  caveat:  history/ROS may be limited by acute altered mental status/delirium versus chronic dementia  Constitutional: No fever/chills Eyes: No visual changes. ENT: No sore throat. Cardiovascular: Denies chest pain. Respiratory: Denies shortness of breath. Gastrointestinal: No abdominal pain.  No nausea, no vomiting.  No diarrhea.  No constipation. Genitourinary: Negative for dysuria. Musculoskeletal: Pain in left shoulder/arm.  Negative for neck pain.  Negative for back pain. Integumentary: Negative for rash. Neurological: Negative for headaches, focal weakness or numbness.   ____________________________________________   PHYSICAL EXAM:  VITAL SIGNS: ED Triage Vitals  Enc Vitals Group     BP 12/06/18 0211 (!) 177/117  Pulse Rate 12/06/18 0211 65     Resp 12/06/18 0211 19     Temp 12/06/18 0211 (!) 97.4 F (36.3 C)     Temp Source 12/06/18 0211 Oral     SpO2 12/06/18 0202 95 %     Weight 12/06/18 0212 59 kg (130 lb)     Height 12/06/18 0212 1.626 m (5\' 4" )     Head Circumference --      Peak Flow --      Pain Score 12/06/18 0212 10     Pain Loc --      Pain Edu? --      Excl. in GC? --     Constitutional: Alert and oriented x 3, but then seems confused at other times and thinks that she fell because she was getting ready for a wedding.  Elderly and appears chronically debilitated but is not in acute distress. Eyes: Conjunctivae are normal.  Head: Atraumatic. Nose: No congestion/rhinnorhea. Mouth/Throat: Mucous membranes are moist. Neck: No stridor.  No meningeal signs.  No cervical spine tenderness to palpation. Cardiovascular: Normal rate, regular rhythm. Good peripheral circulation. Grossly normal heart sounds. Respiratory: Normal respiratory effort.  No retractions. Lungs CTAB. Gastrointestinal: Soft and nontender. No distention.  Musculoskeletal: Tenderness to palpation and manipulation of the left upper arm.  There is some minimal swelling and the compartments  are soft and easily compressible.  No lower extremity tenderness nor edema. No gross deformities of extremities.  I was able to passively range her lower extremities including her hips and there was no reproducible tenderness. Neurologic:  Normal speech and language. No gross focal neurologic deficits are appreciated.  Skin:  Skin is warm, dry and intact. No rash noted. Psychiatric: Mood and affect are normal. Speech and behavior are normal.  ____________________________________________   LABS (all labs ordered are listed, but only abnormal results are displayed)  Labs Reviewed  URINALYSIS, COMPLETE (UACMP) WITH MICROSCOPIC - Abnormal; Notable for the following components:      Result Value   Color, Urine YELLOW (*)    APPearance CLEAR (*)    Hgb urine dipstick SMALL (*)    Leukocytes,Ua MODERATE (*)    All other components within normal limits  CBC WITH DIFFERENTIAL/PLATELET - Abnormal; Notable for the following components:   RBC 2.91 (*)    Hemoglobin 8.9 (*)    HCT 28.0 (*)    All other components within normal limits  COMPREHENSIVE METABOLIC PANEL - Abnormal; Notable for the following components:   Calcium 8.4 (*)    Total Protein 6.3 (*)    All other components within normal limits  URINE CULTURE  CK  TROPONIN I  CARBAMAZEPINE LEVEL, TOTAL   ____________________________________________  EKG  ED ECG REPORT I, Loleta Rose, the attending physician, personally viewed and interpreted this ECG.  Date: 12/06/2018 EKG Time: Sinus rhythm Rate: 65 Rhythm: normal sinus rhythm QRS Axis: normal Intervals: Right bundle branch block and left anterior hemiblock ST/T Wave abnormalities: Non-specific ST segment / T-wave changes, but no clear evidence of acute ischemia. Narrative Interpretation: no definitive evidence of acute ischemia; does not meet STEMI criteria.  There is artifact present that was not present on her prior EKG, but there is no significant change in this when  compared to the last.   ____________________________________________  RADIOLOGY I, Loleta Rose, personally viewed and evaluated these images (plain radiographs) as part of my medical decision making, as well as reviewing the written report by the radiologist.  ED MD interpretation:  Acute proximal left humeral fracture  Official radiology report(s): Ct Head Wo Contrast  Result Date: 12/06/2018 CLINICAL DATA:  Unwitnessed fall tonight. Left arm and shoulder pain. Neck stiffness. Pain right frontal area. EXAM: CT HEAD WITHOUT CONTRAST CT CERVICAL SPINE WITHOUT CONTRAST TECHNIQUE: Multidetector CT imaging of the head and cervical spine was performed following the standard protocol without intravenous contrast. Multiplanar CT image reconstructions of the cervical spine were also generated. COMPARISON:  CT head 12/04/2018. CT head and cervical spine 11/13/2018. MRI brain 12/04/2018 FINDINGS: CT HEAD FINDINGS Brain: Diffuse cerebral atrophy. Ventricular dilatation consistent with central atrophy. Low-attenuation changes in the deep white matter consistent small vessel ischemia. No mass-effect or midline shift. No abnormal extra-axial fluid collections. Gray-white matter junctions are distinct. Basal cisterns are not effaced. No acute intracranial hemorrhage. Vascular: Intracranial arterial vascular calcifications. Skull: Fractures of the right temporal bone extending into the middle cranial fossa and right orbit and sphenoid bone, unchanged since prior study. No new depressed skull fractures. Sinuses/Orbits: Mucosal thickening in the paranasal sinuses. No acute air-fluid levels. Mastoid air cells are clear. Other: None. CT CERVICAL SPINE FINDINGS Alignment: There is about 4 mm anterior subluxation of C5 on C6 and 5 mm anterior subluxation of C6 on C7. the alignment is unchanged since the previous study. Normal alignment of the facet joints. Skull base and vertebrae: Basal skull appears intact. No vertebral  compression deformities. No destructive or expansile bone lesions. Soft tissues and spinal canal: No prevertebral soft tissue swelling. No abnormal paraspinal mass or soft tissue infiltration. Disc levels: Severe degenerative changes throughout the cervical spine, most prominent at C1-2, C5-6, and C6-7 levels. Degenerative changes throughout the facet joints. Similar appearance to previous study. Upper chest: Mild emphysematous changes in the lung apices. Diffuse interlobular septal thickening. Scarring in the lung apices. This is likely fibrosis but could indicate edema. No pneumothorax. Other: None. IMPRESSION: 1. No acute intracranial abnormalities. Chronic atrophy and small vessel ischemic changes. 2. Fractures of the right temporal bone extending into the middle cranial fossa and right orbit and sphenoid bone, unchanged since prior study. 3. Anterior subluxation of C5 on C6 and C6 on C7, unchanged since prior study. Severe degenerative changes throughout the cervical spine. No acute displaced fractures identified. Electronically Signed   By: Burman Nieves M.D.   On: 12/06/2018 04:00   Ct Cervical Spine Wo Contrast  Result Date: 12/06/2018 CLINICAL DATA:  Unwitnessed fall tonight. Left arm and shoulder pain. Neck stiffness. Pain right frontal area. EXAM: CT HEAD WITHOUT CONTRAST CT CERVICAL SPINE WITHOUT CONTRAST TECHNIQUE: Multidetector CT imaging of the head and cervical spine was performed following the standard protocol without intravenous contrast. Multiplanar CT image reconstructions of the cervical spine were also generated. COMPARISON:  CT head 12/04/2018. CT head and cervical spine 11/13/2018. MRI brain 12/04/2018 FINDINGS: CT HEAD FINDINGS Brain: Diffuse cerebral atrophy. Ventricular dilatation consistent with central atrophy. Low-attenuation changes in the deep white matter consistent small vessel ischemia. No mass-effect or midline shift. No abnormal extra-axial fluid collections. Gray-white  matter junctions are distinct. Basal cisterns are not effaced. No acute intracranial hemorrhage. Vascular: Intracranial arterial vascular calcifications. Skull: Fractures of the right temporal bone extending into the middle cranial fossa and right orbit and sphenoid bone, unchanged since prior study. No new depressed skull fractures. Sinuses/Orbits: Mucosal thickening in the paranasal sinuses. No acute air-fluid levels. Mastoid air cells are clear. Other: None. CT CERVICAL SPINE FINDINGS Alignment: There is about 4 mm anterior subluxation of C5 on C6 and 5  mm anterior subluxation of C6 on C7. the alignment is unchanged since the previous study. Normal alignment of the facet joints. Skull base and vertebrae: Basal skull appears intact. No vertebral compression deformities. No destructive or expansile bone lesions. Soft tissues and spinal canal: No prevertebral soft tissue swelling. No abnormal paraspinal mass or soft tissue infiltration. Disc levels: Severe degenerative changes throughout the cervical spine, most prominent at C1-2, C5-6, and C6-7 levels. Degenerative changes throughout the facet joints. Similar appearance to previous study. Upper chest: Mild emphysematous changes in the lung apices. Diffuse interlobular septal thickening. Scarring in the lung apices. This is likely fibrosis but could indicate edema. No pneumothorax. Other: None. IMPRESSION: 1. No acute intracranial abnormalities. Chronic atrophy and small vessel ischemic changes. 2. Fractures of the right temporal bone extending into the middle cranial fossa and right orbit and sphenoid bone, unchanged since prior study. 3. Anterior subluxation of C5 on C6 and C6 on C7, unchanged since prior study. Severe degenerative changes throughout the cervical spine. No acute displaced fractures identified. Electronically Signed   By: Burman Nieves M.D.   On: 12/06/2018 04:00   Dg Shoulder Left  Result Date: 12/06/2018 CLINICAL DATA:  Larey Seat last night.   LEFT arm pain. EXAM: LEFT SHOULDER - 2+ VIEW COMPARISON:  None. FINDINGS: Acute angulated proximal LEFT humeral metadiaphyseal fracture extending to surgical neck with impaction. No dislocation. Osteopenia. No destructive bony lesions. Soft tissue planes are non suspicious. IMPRESSION: 1. Acute displaced proximal LEFT humerus fracture.  No dislocation. Electronically Signed   By: Awilda Metro M.D.   On: 12/06/2018 02:33    ____________________________________________   PROCEDURES   Procedure(s) performed (including Critical Care):  Procedures   ____________________________________________   INITIAL IMPRESSION / MDM / ASSESSMENT AND PLAN / ED COURSE  As part of my medical decision making, I reviewed the following data within the electronic MEDICAL RECORD NUMBER Nursing notes reviewed and incorporated, Labs reviewed , EKG interpreted , Old chart reviewed, Radiograph reviewed , Notes from prior ED visits and [place] Controlled Substance Database       Differential diagnosis includes, but is not limited to, arm fracture or dislocation, acute intracranial bleeding, cervical spine fracture, acute infection or metabolic abnormality, medication side effect or supratherapeutic carbamazepine level.  I will check lab work and a urinalysis.  Left shoulder x-ray demonstrates a proximal humerus fracture.  I spoke with Dr. Joice Lofts by phone who confirmed the recommendation of shoulder immobilizer and follow-up in about 1 week.  Patient is stable and in no distress at this time although she does report that the arm hurts.  I will reassess after further work-up but anticipate discharge with outpatient follow-up.  Clinical Course as of Dec 07 455  Fri Dec 06, 2018  0344 Labs are reassuring including her carbamazepine level.  Her hemoglobin is down a little bit but is still about 9 and not indicative of needing a transfusion.   [CF]  0353 Possible UTI, but mild.  I have ordered fosfomycin 3 g by mouth and sent a  urine culture.  Awaiting CT scan results.  Urinalysis, Complete w Microscopic(!) [CF]  0451 I called the patient's daughter who is listed as her emergency contact and left a HIPAA compliant voicemail for her to call me back.  The patient is hemodynamically stable and ready for discharge.  I have ordered tramadol 50 mg by mouth and Tylenol 650 mg by mouth.   [CF]  0454 The patient's CT scan of the head shows subacute  or chronic fractures but nothing acute and the CT scan of the cervical spine also shows nothing acute.  CK was normal.  Urine culture is pending.  Patient is awake and alert and speaking with the nurse as I am writing this.  I will discharge back to her facility and update the daughter if she calls me back.   [CF]    Clinical Course User Index [CF] Loleta Rose, MD    ____________________________________________  FINAL CLINICAL IMPRESSION(S) / ED DIAGNOSES  Final diagnoses:  Fall, initial encounter  Closed fracture of proximal end of left humerus, unspecified fracture morphology, initial encounter     MEDICATIONS GIVEN DURING THIS VISIT:  Medications  fosfomycin (MONUROL) packet 3 g (3 g Oral Given 12/06/18 0417)  traMADol (ULTRAM) tablet 50 mg (50 mg Oral Given 12/06/18 0452)  acetaminophen (TYLENOL) tablet 650 mg (650 mg Oral Given 12/06/18 0452)     ED Discharge Orders         Ordered    traMADol (ULTRAM) 50 MG tablet  Every 6 hours PRN     12/06/18 0454           Note:  This document was prepared using Dragon voice recognition software and may include unintentional dictation errors.   Loleta Rose, MD 12/06/18 [phone]

## 2018-12-06 NOTE — ED Notes (Signed)
Patient transported to CT 

## 2018-12-06 NOTE — ED Triage Notes (Signed)
Report from EMS: PT w/ recent hx of falls. Pt presents post-fall tonight, unwitnessed, unknown down time. Pt c/o L arm/shoulder pain pain. EMS reports no deformity, however, L shoulder is deformed and swollen, exquisitely tender to palpation and movement. Pt is A&O x 3.

## 2018-12-07 LAB — URINE CULTURE
Culture: NO GROWTH
Special Requests: NORMAL

## 2018-12-19 ENCOUNTER — Telehealth: Payer: Self-pay | Admitting: Neurology

## 2018-12-19 NOTE — Telephone Encounter (Signed)
FYI

## 2018-12-19 NOTE — Telephone Encounter (Signed)
Pt daughter(on DPR) has called wanting to make Rn aware of cognitive ability deminishing before appointment.  Daughter states since before the appointment was scheduled pt has fallen again and pt struggles with reading and witting.  Daughter is asking for a call to discuss

## 2018-12-19 NOTE — Telephone Encounter (Signed)
Suzanne Russo returned my call and wanted to update Dr. Anne Hahn. She states the pt has had a few falls and the daughter has noticed a severe decline in her cognitive ability. Pt has an appointment scheduled for tomorrow at 12 pm with a check in time of 11:30 am and I assured concerns would be discussed then.  Daughter was agreeable and had no further questions/concerns.

## 2018-12-19 NOTE — Telephone Encounter (Signed)
Requested a call back from pt's daughter Marisue Humble.

## 2018-12-19 NOTE — Telephone Encounter (Signed)
The patient has had a recent fall on 06 December 2018, she also fell on 13 November 2018, she is to sustained a skull fracture with this fall.  She was in the emergency room on 04 December 2018 with a severe urinary tract infection.  The recent concussion, urinary tract infection may have led to a significant increase in confusion, she has extensive small vessel disease by MRI of the brain that may be leading to some of the changes in memory and gait.  We will see her in the office tomorrow.

## 2018-12-20 ENCOUNTER — Other Ambulatory Visit: Payer: Self-pay

## 2018-12-20 ENCOUNTER — Encounter: Payer: Self-pay | Admitting: Neurology

## 2018-12-20 ENCOUNTER — Ambulatory Visit: Payer: Medicare Other | Admitting: Neurology

## 2018-12-20 VITALS — BP 113/70 | HR 53 | Wt 106.0 lb

## 2018-12-20 DIAGNOSIS — F0781 Postconcussional syndrome: Secondary | ICD-10-CM | POA: Diagnosis not present

## 2018-12-20 DIAGNOSIS — R413 Other amnesia: Secondary | ICD-10-CM

## 2018-12-20 HISTORY — DX: Postconcussional syndrome: F07.81

## 2018-12-20 NOTE — Progress Notes (Signed)
Reason for visit: Gait disorder, memory disorder, concussion  Suzanne Russo is a 85 y.o. female  History of present illness:  Suzanne Russo is an 85 year old right-handed white female with a history of trigeminal neuralgia, the patient was last seen in October 2019, she was noted to have a memory disorder at that point, but she did not wish to go on medication for memory.  The patient has had a hospitalization on 13 November 2018 following a fall.  A fall occurred at home, the patient has no recollection of the event.  She sustained a skull fracture and was noted to have subarachnoid blood within the ventricular system on CT scan of the brain.  The patient went to rehab facility and seemed to be gaining her ability to walk fairly well again with a Rollator.  The patient has been somewhat confused since the fall, on 04 December 2018, the patient went to the emergency room with a severe bladder infection that manifested with increased confusion.  Shortly after that visit, she went back to the emergency room on 06 December 2018 following another fall with a fracture of her left humerus.  For this reason, the patient has not been able to use a walker or Rollator.  She has been discharged from the rehab facility.  The patient is now living at home with someone that comes in as a caretaker.  The patient has not been eating very well at home, she ate well in the rehab facility.  The patient reports problems with headache, she has some dizziness at times, she reports some blurring or double vision.  She has had some slowing of cognitive abilities.  The patient does have a tendency to lean backwards when she stands up.  She may feel somewhat woozy with standing.  She comes to this office for an evaluation.  Past Medical History:  Diagnosis Date  . Carpal tunnel syndrome   . Concussion with no loss of consciousness 08/26/2015  . Degenerative arthritis   . DVT (deep venous thrombosis) (HCC)   . GERD  (gastroesophageal reflux disease)   . Hemorrhoids   . Hypothyroidism   . Memory difficulty 07/16/2014  . Occipital neuralgia   . Occipital neuralgia of left side 12/15/2014  . Osteoporosis   . Pelvic fracture (HCC)   . Peptic ulcer disease   . Right clavicle fracture   . Trigeminal neuralgia   . Venous insufficiency     Past Surgical History:  Procedure Laterality Date  . bilateral cataract surgery    . bilateral knee surgery    . left hip fracture    . left thumb surgery    . left toe surgery    . NASAL SINUS SURGERY    . TONSILLECTOMY    . VAGINAL HYSTERECTOMY      Family History  Problem Relation Age of Onset  . Heart disease Mother   . COPD Father   . Congestive Heart Failure Brother     Social history:  reports that she has quit smoking. Her smoking use included cigarettes. She has a 45.00 pack-year smoking history. She has never used smokeless tobacco. She reports current alcohol use of about 7.0 standard drinks of alcohol per week. She reports that she does not use drugs.  Medications:  Prior to Admission medications   Medication Sig Start Date End Date Taking? Authorizing Provider  acetaminophen (TYLENOL) 500 MG tablet Take 500 mg by mouth every 8 (eight) hours as needed.  [provider]  Biotin 300 MCG TABS Take 1 tablet (300 mcg total) by mouth daily. 03/20/17   Katharina Caper, MD  Calcium Carb-Cholecalciferol (OS-CAL CALCIUM + D3 PO) Take 1 capsule by mouth 2 (two) times daily.    [provider]  carbamazepine (CARBATROL) 200 MG 12 hr capsule Take 1 capsule (200 mg total) by mouth 2 (two) times daily. 01/03/18   York Spaniel, MD  carbamazepine (CARBATROL) 300 MG 12 hr capsule Take 1 capsule (300 mg total) by mouth 2 (two) times daily. 05/02/18   York Spaniel, MD  celecoxib (CELEBREX) 200 MG capsule Take 200 mg by mouth daily. 08/19/15   [provider]  Cyanocobalamin (B-12 PO) Take 1 tablet by mouth daily.     [provider]  hydrocortisone (ANUSOL-HC) 25 MG suppository Place 25 mg rectally as needed.    [provider]  hydrocortisone (ANUSOL-HC) 25 MG suppository Place 1 suppository rectally 2 (two) times daily.    [provider]  levothyroxine (SYNTHROID, LEVOTHROID) 100 MCG tablet Take 100 mcg by mouth daily before breakfast.    [provider]  loratadine-pseudoephedrine (CLARITIN-D 24-HOUR) 10-240 MG 24 hr tablet Take 1 tablet by mouth daily.    [provider]  meclizine (ANTIVERT) 25 MG tablet Take 25 mg by mouth every 6 (six) hours as needed.    [provider]  Omega-3 Fatty Acids (FISH OIL) 1000 MG CAPS Take 1,000 mg by mouth daily.     [provider]  omeprazole (PRILOSEC) 20 MG capsule Take 20 mg by mouth daily.    [provider]  Potassium Gluconate 550 MG TABS Take 1 tablet by mouth every evening.    [provider]  traMADol (ULTRAM) 50 MG tablet Take 2 tablets (100 mg total) by mouth every 6 (six) hours as needed for moderate pain or severe pain. 12/06/18   Loleta Rose, MD  triamcinolone cream (KENALOG) 0.1 % Apply 1 application topically 2 (two) times daily. Compounded with eucerin    [provider]  vitamin E 400 UNIT capsule Take 400 Units by mouth daily.    [provider]  zolendronic acid (ZOMETA) 4 MG/5ML injection Inject 4 mg into the vein once. Last received inj 11/07/17    [provider]      Allergies  Allergen Reactions  . Aricept [Donepezil]     GI upset  . Citalopram Other (See Comments)    Caused bad dreams  . Sulfa Antibiotics Rash  . Tetracycline Rash    ROS:  Out of a complete 14 system review of symptoms, the patient complains only of the following symptoms, and all other reviewed systems are negative.  Memory disturbance Gait disturbance Headache  Blood pressure 113/70, pulse (!) 53, weight 106 lb (48.1 kg).  Physical Exam  General: The patient  is alert and cooperative at the time of the examination.  Eyes: Pupils are equal, round, and reactive to light. Discs are flat bilaterally.  Neck: The neck is supple, no carotid bruits are noted.  Respiratory: The respiratory examination is clear.  Cardiovascular: The cardiovascular examination reveals a regular rate and rhythm, no obvious murmurs or rubs are noted.  Skin: Extremities are without significant edema.  The left arm is in a sling.  Neurologic Exam  Mental status: The patient is alert and oriented x 2 at the time of the examination (not oriented to date). The Mini-Mental Status Examination done today shows a total score of  6/30.  Cranial nerves: Facial symmetry is present. There is good sensation of the face to pinprick and soft touch bilaterally. The strength of the facial muscles and the muscles to head turning and shoulder shrug are normal bilaterally. Speech is well enunciated, no aphasia or dysarthria is noted. Extraocular movements are full. Visual fields are full. The tongue is midline, and the patient has symmetric elevation of the soft palate. No obvious hearing deficits are noted.  Motor: The motor testing reveals 5 over 5 strength of all 4 extremities, with exception that the left arm cannot be fully tested, the patient has good grip strength on the left. Good symmetric motor tone is noted throughout.  Sensory: Sensory testing is intact to pinprick, soft touch and vibration sensation on all 4 extremities. No evidence of extinction is noted.  Coordination: Cerebellar testing reveals good finger-nose-finger on the right and heel-to-shin bilaterally.  Gait and station: The patient is able stand with assistance, she has a tendency to lean backwards.  She can walk with assistance, gait is wide-based.  Romberg is unsteady, the patient has a tendency to fall backwards.  Reflexes: Deep tendon reflexes are symmetric, but are depressed bilaterally.   CT head and cervical  12/06/18:  IMPRESSION: 1. No acute intracranial abnormalities. Chronic atrophy and small vessel ischemic changes. 2. Fractures of the right temporal bone extending into the middle cranial fossa and right orbit and sphenoid bone, unchanged since prior study. 3. Anterior subluxation of C5 on C6 and C6 on C7, unchanged since prior study. Severe degenerative changes throughout the cervical spine. No acute displaced fractures identified.  * CT scan images were reviewed online. I agree with the written report.   MRI brain 12/04/18:  IMPRESSION: 1. No acute intracranial abnormality. 2. Severe chronic small vessel ischemic disease.  * MRI scan images were reviewed online. I agree with the written report.     Assessment/Plan:  1.  Recent fall with concussion, subarachnoid bleeding  2.  Postconcussive headache, postconcussive syndrome  3.  Extensive small vessel disease by MRI brain  4.  Memory disorder  5.  Gait disorder  6.  History of trigeminal neuralgia  The patient has had a significant decline in her cognitive functioning following a relatively severe concussion associated with subarachnoid bleeding and a skull fracture.  The patient is still having headaches, physical therapy will be set up for the home environment, I will delay the onset of speech therapy for cognitive training by another 4 weeks because of the concussion.  The patient will follow-up in the next scheduled visit in May, will follow the mental status over time.  Marlan Palau MD 12/20/2018 12:57 PM  [facility] [address] [place], [place] [address]  Phone [phone] Fax [phone]

## 2018-12-30 ENCOUNTER — Telehealth: Payer: Self-pay | Admitting: Neurology

## 2018-12-30 MED ORDER — ESCITALOPRAM OXALATE 5 MG PO TABS: 5.0000 mg | ORAL_TABLET | Freq: Every day | ORAL | 1 refills | Status: DC

## 2018-12-30 NOTE — Telephone Encounter (Signed)
I tried to call Suzanne Russo, left message, I will call back later.

## 2018-12-30 NOTE — Telephone Encounter (Signed)
I called the daughter.  The patient has had some agitation, some of the agitation is in the evenings and may be sundowning.  We will try low-dose Lexapro to start taking 5 mg daily, they will contact me for any dose adjustments.

## 2019-01-02 ENCOUNTER — Telehealth: Payer: Self-pay

## 2019-01-02 NOTE — Telephone Encounter (Signed)
Patient contacted for Palliative Care visit.  Due to the current COVID-19 infection/crises, the patient and family prefer, and have given their verbal consent for, a provider visit via telemedicine. HIPPA policies of confidentially were discussed and patient/family expressed understanding.  It was mentioned that we may be able to video conference, by sending the link via  patient's e-mail. E-mail address [email or web address].   Contact person is patient's husband, Ree Kida. Visit scheduled for 01/03/2019

## 2019-01-03 ENCOUNTER — Other Ambulatory Visit: Payer: Self-pay

## 2019-01-03 ENCOUNTER — Other Ambulatory Visit: Payer: Medicare Other | Admitting: Student

## 2019-01-07 ENCOUNTER — Telehealth: Payer: Self-pay | Admitting: Student

## 2019-01-07 ENCOUNTER — Other Ambulatory Visit: Payer: Self-pay

## 2019-01-07 ENCOUNTER — Other Ambulatory Visit: Payer: Medicare Other | Admitting: Student

## 2019-01-07 NOTE — Telephone Encounter (Signed)
Palliative NP called to rescheduled Palliative consult in the home due to COVID-19 precautions. Family was offered option to schedule a tele-health visit; Suzanne Russo declined at this time and would like to reschedule an in person visit. Will call back at a later time to schedule visit.

## 2019-01-20 ENCOUNTER — Telehealth: Payer: Self-pay | Admitting: Student

## 2019-01-20 NOTE — Telephone Encounter (Signed)
NP was able to speak with daughter Roddie Mc. Scheduled initial Palliative consult for Friday at 11am.

## 2019-01-20 NOTE — Telephone Encounter (Signed)
Palliative NP left message for daughter Quay Burow to schedule appointment. Awaiting return call.

## 2019-01-24 ENCOUNTER — Other Ambulatory Visit: Payer: Self-pay

## 2019-01-24 ENCOUNTER — Other Ambulatory Visit: Payer: Medicare Other | Admitting: Student

## 2019-01-24 DIAGNOSIS — Z515 Encounter for palliative care: Secondary | ICD-10-CM

## 2019-01-24 NOTE — Progress Notes (Signed)
[facility] Consult Note Telephone: [phone]  Fax: [phone]  PATIENT NAME: Suzanne Russo DOB: 1932-07-01 MRN: [id number]  PRIMARY CARE PROVIDER:   Dion Body, MD  REFERRING PROVIDER:  Dion Body, MD [address], [place] [address]  RESPONSIBLE PARTY: Husband, Suzanne Russo     ASSESSMENT: Palliative NP met with Suzanne Russo, [name], daughter Suzanne Russo and private caregiver Suzanne Russo. Suzanne Russo is alert and oriented x 2, some forgetfulness noted. No acute distress noted. She was able to accurately state her birth date and today's date. She also was able to provide some of her medical history. Discussed role of Palliative Medicine. We discussed goals of care, we discussed symptom management. We discussed advanced care planning. Suzanne Russo is encouraged to delegate some tasks to private caregivers in the home to help alleviate some stressors. Screening for Covid-19 performed prior to in-person visit.        RECOMMENDATIONS and PLAN:  1. Code status: DNR. 2. Medical goals of therapy: Continue PT and ST as directed. Follow up with PCP and Cardiologist as scheduled. 3. Symptom management: Appetite-recommendation for mirtazapine 7.50m qhs X 2 weeks, then increase to 145mqhs. Continue boost nutritional supplement, offer smaller, more frequent meals throughout the day. Pain-continue acetaminophen 50036m tabs every 8 hours prn. Reflux-continue omperazole; take first thing each morning.  4. Discharge Planning: Suzanne Russo continue to reside at home with husband and 24/7 caregivers. [place] [facility] [place].   Suzanne Russo with above recommendation for appetite.  Palliative Medicine will follow up in 4 weeks or sooner, if needed.   I spent 60 minutes providing this consultation,  from 11:00pm to 12:00pm. More than 50% of the time in this  consultation was spent coordinating communication.   HISTORY OF PRESENT ILLNESS:  Suzanne Russo a 87 43o.  female with multiple medical problems including trigeminal neuralgia, abdominal pain, abnormal weight loss, anemia, hyperlipidemia, hypothyroidism, osteoarthritis, osteoporosis. Suzanne Russo hospitalized 11/13/2018 to 11/22/2018 following a fall; diagnosed with subarachnoid hemorrhage, closed right orbital fracture.She was then discharged to skilled facility. She was seen in emergency room on 12/06/2018 due to fall; she sustained closed fracture of proximal end of left humerus. She was seen in emergency room on 12/04/2018 for urinary tract infection. She discharged home from skilled facility the first week of March. She has had 24/7 caregivers in the home since then. She has received physical therapy and [facility] to [facility]. Caregiver Suzanne Russo states she will sometimes express discomfort when adl's are performed; tylenol has been effective. Prior to falls, she had been ambulating with a rollator; she currently ambulates with a quad cane; she has immobilizer sling to left arm. No falls since returning home. She denies cough or shortness of breath. She has hard stools alternating with loose stools; husband states this has been going on for years. He would like to manage this with diet at this time. Her appetite has been poor. Her weight yesterday was 104 pounds; she was 111 pounds 08/2018. Husband prepares meals; she also receives boost nutritional supplements. Suzanne Russo report some abdominal pain; Suzanne Russo "she only has half of a stomach." Patient had been on carafate in the past, but was unable to take due to size of pill. She does take omeprazole around 9am each morning.  Suzanne Russo was seen by Suzanne Russo yesterday; she has a holter monitor on at present. She denies chest pain, palpitations, headaches, dizziness or  feeling light-headed. She has been sleeping well. Suzanne Russo expresses being overwhelmed with taking care of wife. There are 24/7 caregivers present and Suzanne Russo plan to move to [facility]. They are awaiting a moving date, which has been affected by Covid-19. She does have a Living Will. Palliative Care was asked to help address goals of care.   CODE STATUS: DNR   PPS: 50% HOSPICE ELIGIBILITY/DIAGNOSIS: TBD  PAST MEDICAL HISTORY:  Past Medical History:  Diagnosis Date   Carpal tunnel syndrome    Concussion with no loss of consciousness 08/26/2015   Degenerative arthritis    DVT (deep venous thrombosis) (HCC)    GERD (gastroesophageal reflux disease)    Hemorrhoids    Hypothyroidism    Memory difficulty 07/16/2014   Occipital neuralgia    Occipital neuralgia of left side 12/15/2014   Osteoporosis    Pelvic fracture (HCC)    Peptic ulcer disease    Postconcussive syndrome 12/20/2018   Right clavicle fracture    Trigeminal neuralgia    Venous insufficiency     SOCIAL HX:  Social History   Tobacco Use   Smoking status: Former Smoker    Packs/day: 1.00    Years: 45.00    Pack years: 45.00    Types: Cigarettes   Smokeless tobacco: Never Used  Substance Use Topics   Alcohol use: Yes    Alcohol/week: 7.0 standard drinks    Types: 7 Glasses of wine per week    Comment: wine on occasion    ALLERGIES:  Allergies  Allergen Reactions   Aricept [Donepezil]     GI upset   Citalopram Other (See Comments)    Caused bad dreams   Sulfa Antibiotics Rash   Tetracycline Rash     PERTINENT MEDICATIONS:  Outpatient Encounter Medications as of 01/24/2019  Medication Sig   acetaminophen (TYLENOL) 500 MG tablet Take 500 mg by mouth every 8 (eight) hours as needed.   Calcium Carb-Cholecalciferol (OS-CAL CALCIUM + D3 PO) Take 1 capsule by mouth 2 (two) times daily.   carbamazepine (CARBATROL) 200 MG 12 hr capsule Take 1 capsule (200 mg  total) by mouth 2 (two) times daily.   carbamazepine (CARBATROL) 300 MG 12 hr capsule Take 1 capsule (300 mg total) by mouth 2 (two) times daily.   celecoxib (CELEBREX) 200 MG capsule Take 200 mg by mouth daily.   Cyanocobalamin (B-12 PO) Take 1 tablet by mouth daily.    escitalopram (LEXAPRO) 5 MG tablet Take 1 tablet (5 mg total) by mouth daily.   hydrocortisone (ANUSOL-HC) 25 MG suppository Place 25 mg rectally as needed.   levothyroxine (SYNTHROID, LEVOTHROID) 100 MCG tablet Take 100 mcg by mouth daily before breakfast.   omeprazole (PRILOSEC) 20 MG capsule Take 20 mg by mouth daily.   triamcinolone cream (KENALOG) 0.1 % Apply 1 application topically 2 (two) times daily. Compounded with eucerin   Biotin 300 MCG TABS Take 1 tablet (300 mcg total) by mouth daily. (Patient not taking: Reported on 01/24/2019)   hydrocortisone (ANUSOL-HC) 25 MG suppository Place 1 suppository rectally 2 (two) times daily.   loratadine-pseudoephedrine (CLARITIN-D 24-HOUR) 10-240 MG 24 hr tablet Take 1 tablet by mouth daily.   meclizine (ANTIVERT) 25 MG tablet Take 25 mg by mouth every 6 (six) hours as needed.   Omega-3 Fatty Acids (FISH OIL)  1000 MG CAPS Take 1,000 mg by mouth daily.    Potassium Gluconate 550 MG TABS Take 1 tablet by mouth every evening.   traMADol (ULTRAM) 50 MG tablet Take 2 tablets (100 mg total) by mouth every 6 (six) hours as needed for moderate pain or severe pain. (Patient not taking: Reported on 01/24/2019)   vitamin E 400 UNIT capsule Take 400 Units by mouth daily.   zolendronic acid (ZOMETA) 4 MG/5ML injection Inject 4 mg into the vein once. Last received inj 11/07/17   No facility-administered encounter medications on file as of 01/24/2019.     PHYSICAL EXAM:   General: NAD, frail appearing, thin Cardiovascular: regular rate and rhythm Pulmonary: clear ant fields Abdomen: soft, nontender, + bowel sounds Extremities: no edema, no joint deformities Skin: no  rashes Neurological: Weakness but otherwise nonfocal  Ezekiel Slocumb, NP

## 2019-01-27 ENCOUNTER — Telehealth: Payer: Self-pay | Admitting: Student

## 2019-01-27 ENCOUNTER — Other Ambulatory Visit: Payer: Self-pay | Admitting: Neurology

## 2019-01-27 NOTE — Telephone Encounter (Signed)
NP spoke with Suzanne Russo at Dr. Sherryll Burger office. New order received for mirtazapine 7.5mg  daily at bedtime for appetite. Monitor for effectiveness, need to increase dosage. Suzanne Russo is made aware that new order received.

## 2019-02-06 ENCOUNTER — Ambulatory Visit: Payer: Medicare Other | Admitting: Neurology

## 2019-02-10 ENCOUNTER — Emergency Department
Admission: EM | Admit: 2019-02-10 | Discharge: 2019-02-11 | Disposition: A | Discharge status: Home / Self Care | Payer: Medicare Other | Attending: Emergency Medicine | Admitting: Emergency Medicine

## 2019-02-10 ENCOUNTER — Emergency Department: Payer: Medicare Other

## 2019-02-10 ENCOUNTER — Other Ambulatory Visit: Payer: Self-pay

## 2019-02-10 DIAGNOSIS — Z87891 Personal history of nicotine dependence: Secondary | ICD-10-CM | POA: Insufficient documentation

## 2019-02-10 DIAGNOSIS — F0391 Unspecified dementia with behavioral disturbance: Secondary | ICD-10-CM | POA: Diagnosis not present

## 2019-02-10 DIAGNOSIS — Z1159 Encounter for screening for other viral diseases: Secondary | ICD-10-CM | POA: Diagnosis not present

## 2019-02-10 DIAGNOSIS — R456 Violent behavior: Secondary | ICD-10-CM | POA: Diagnosis present

## 2019-02-10 DIAGNOSIS — Z79899 Other long term (current) drug therapy: Secondary | ICD-10-CM | POA: Diagnosis not present

## 2019-02-10 DIAGNOSIS — R4182 Altered mental status, unspecified: Secondary | ICD-10-CM | POA: Insufficient documentation

## 2019-02-10 DIAGNOSIS — E039 Hypothyroidism, unspecified: Secondary | ICD-10-CM | POA: Insufficient documentation

## 2019-02-10 DIAGNOSIS — R413 Other amnesia: Secondary | ICD-10-CM | POA: Diagnosis not present

## 2019-02-10 DIAGNOSIS — R4689 Other symptoms and signs involving appearance and behavior: Secondary | ICD-10-CM

## 2019-02-10 LAB — URINALYSIS, COMPLETE (UACMP) WITH MICROSCOPIC
Bacteria, UA: NONE SEEN
Bilirubin Urine: NEGATIVE
Glucose, UA: NEGATIVE mg/dL
Hgb urine dipstick: NEGATIVE
Ketones, ur: 5 mg/dL — AB
Nitrite: NEGATIVE
Protein, ur: NEGATIVE mg/dL
Specific Gravity, Urine: 1.014 (ref 1.005–1.030)
pH: 7 (ref 5.0–8.0)

## 2019-02-10 LAB — CBC
HCT: 32.6 % — ABNORMAL LOW (ref 36.0–46.0)
Hemoglobin: 10.5 g/dL — ABNORMAL LOW (ref 12.0–15.0)
MCH: 30.1 pg (ref 26.0–34.0)
MCHC: 32.2 g/dL (ref 30.0–36.0)
MCV: 93.4 fL (ref 80.0–100.0)
Platelets: 283 10*3/uL (ref 150–400)
RBC: 3.49 MIL/uL — ABNORMAL LOW (ref 3.87–5.11)
RDW: 13 % (ref 11.5–15.5)
WBC: 4.6 10*3/uL (ref 4.0–10.5)
nRBC: 0 % (ref 0.0–0.2)

## 2019-02-10 LAB — URINE DRUG SCREEN, QUALITATIVE (ARMC ONLY)
Amphetamines, Ur Screen: POSITIVE — AB
Barbiturates, Ur Screen: NOT DETECTED
Benzodiazepine, Ur Scrn: NOT DETECTED
Cannabinoid 50 Ng, Ur ~~LOC~~: NOT DETECTED
Cocaine Metabolite,Ur ~~LOC~~: NOT DETECTED
MDMA (Ecstasy)Ur Screen: NOT DETECTED
Methadone Scn, Ur: NOT DETECTED
Opiate, Ur Screen: NOT DETECTED
Phencyclidine (PCP) Ur S: NOT DETECTED
Tricyclic, Ur Screen: NOT DETECTED

## 2019-02-10 LAB — CARBAMAZEPINE LEVEL, TOTAL: Carbamazepine Lvl: 8 ug/mL (ref 4.0–12.0)

## 2019-02-10 LAB — COMPREHENSIVE METABOLIC PANEL
ALT: 13 U/L (ref 0–44)
AST: 21 U/L (ref 15–41)
Albumin: 4.3 g/dL (ref 3.5–5.0)
Alkaline Phosphatase: 68 U/L (ref 38–126)
Anion gap: 11 (ref 5–15)
BUN: 15 mg/dL (ref 8–23)
CO2: 27 mmol/L (ref 22–32)
Calcium: 8.8 mg/dL — ABNORMAL LOW (ref 8.9–10.3)
Chloride: 95 mmol/L — ABNORMAL LOW (ref 98–111)
Creatinine, Ser: 0.53 mg/dL (ref 0.44–1.00)
GFR calc Af Amer: >60 mL/min (ref >60)
GFR calc non Af Amer: >60 mL/min (ref >60)
Glucose, Bld: 106 mg/dL — ABNORMAL HIGH (ref 70–99)
Potassium: 4.5 mmol/L (ref 3.5–5.1)
Sodium: 133 mmol/L — ABNORMAL LOW (ref 135–145)
Total Bilirubin: 0.6 mg/dL (ref 0.3–1.2)
Total Protein: 7.3 g/dL (ref 6.5–8.1)

## 2019-02-10 LAB — TSH: TSH: 1.889 u[IU]/mL (ref 0.350–4.500)

## 2019-02-10 LAB — ACETAMINOPHEN LEVEL: Acetaminophen (Tylenol), Serum: <10 ug/mL — ABNORMAL LOW (ref 10–30)

## 2019-02-10 LAB — TROPONIN I: Troponin I: <0.03 ng/mL (ref <0.03)

## 2019-02-10 LAB — SARS CORONAVIRUS 2 BY RT PCR (HOSPITAL ORDER, PERFORMED IN ~~LOC~~ HOSPITAL LAB): SARS Coronavirus 2: NEGATIVE

## 2019-02-10 LAB — SALICYLATE LEVEL: Salicylate Lvl: <7 mg/dL (ref 2.8–30.0)

## 2019-02-10 LAB — ETHANOL: Alcohol, Ethyl (B): <10 mg/dL (ref <10)

## 2019-02-10 MED ORDER — OLANZAPINE 5 MG PO TBDP: 2.5000 mg | ORAL_TABLET | Freq: Two times a day (BID) | ORAL | Status: DC | PRN

## 2019-02-10 MED ORDER — LORAZEPAM 2 MG/ML IJ SOLN: 1.0000 mg | Freq: Once | INTRAMUSCULAR | Status: DC

## 2019-02-10 MED ORDER — CIPROFLOXACIN HCL 500 MG PO TABS
500.0000 mg | ORAL_TABLET | Freq: Two times a day (BID) | ORAL | Status: DC
  Administered 2019-02-10 – 2019-02-11 (×3): 500 mg via ORAL
  Filled 2019-02-10 (×3): qty 1

## 2019-02-10 MED ORDER — CARBAMAZEPINE ER 100 MG PO TB12
300.0000 mg | ORAL_TABLET | Freq: Two times a day (BID) | ORAL | Status: DC
  Administered 2019-02-10 – 2019-02-11 (×2): 300 mg via ORAL
  Filled 2019-02-10 (×3): qty 3

## 2019-02-10 MED ORDER — CARBAMAZEPINE ER 200 MG PO TB12
200.0000 mg | ORAL_TABLET | Freq: Two times a day (BID) | ORAL | Status: DC
  Administered 2019-02-10 – 2019-02-11 (×2): 200 mg via ORAL
  Filled 2019-02-10 (×3): qty 1

## 2019-02-10 MED ORDER — LORAZEPAM 2 MG PO TABS
2.0000 mg | ORAL_TABLET | Freq: Once | ORAL | Status: AC
  Administered 2019-02-10: 2 mg via ORAL

## 2019-02-10 MED ORDER — PANTOPRAZOLE SODIUM 40 MG PO TBEC
40.0000 mg | DELAYED_RELEASE_TABLET | Freq: Every day | ORAL | Status: DC
  Administered 2019-02-11: 40 mg via ORAL
  Filled 2019-02-10: qty 1

## 2019-02-10 MED ORDER — ACETAMINOPHEN 500 MG PO TABS: 500.0000 mg | ORAL_TABLET | Freq: Three times a day (TID) | ORAL | Status: DC | PRN

## 2019-02-10 MED ORDER — OLANZAPINE 5 MG PO TBDP: 2.5000 mg | ORAL_TABLET | Freq: Every day | ORAL | Status: DC

## 2019-02-10 MED ORDER — OLANZAPINE 5 MG PO TBDP
2.5000 mg | ORAL_TABLET | Freq: Once | ORAL | Status: AC
  Administered 2019-02-10: 2.5 mg via ORAL
  Filled 2019-02-10: qty 1

## 2019-02-10 MED ORDER — LEVOTHYROXINE SODIUM 50 MCG PO TABS
100.0000 ug | ORAL_TABLET | Freq: Every day | ORAL | Status: DC
  Administered 2019-02-11: 100 ug via ORAL
  Filled 2019-02-10: qty 2

## 2019-02-10 MED ORDER — ESCITALOPRAM OXALATE 10 MG PO TABS: 5.0000 mg | ORAL_TABLET | Freq: Every day | ORAL | Status: DC

## 2019-02-10 MED ORDER — LORAZEPAM 2 MG PO TABS
2.0000 mg | ORAL_TABLET | Freq: Once | ORAL | Status: DC
  Filled 2019-02-10: qty 1

## 2019-02-10 MED ORDER — OLANZAPINE 5 MG PO TBDP
5.0000 mg | ORAL_TABLET | Freq: Every day | ORAL | Status: DC
  Administered 2019-02-10: 5 mg via ORAL
  Filled 2019-02-10: qty 1

## 2019-02-10 NOTE — ED Notes (Signed)
Pt took meds tonight

## 2019-02-10 NOTE — ED Notes (Signed)
Collected Coronavirus swab sent to lab.

## 2019-02-10 NOTE — ED Notes (Signed)
Resumed care from Suzanne Russo t rn.  Pt in hallway bed sleeping at this time

## 2019-02-10 NOTE — ED Provider Notes (Signed)
-----------------------------------------   6:38 PM on 02/10/2019 -----------------------------------------  Patient has been seen by psychiatry.  Psychiatrist has spoken to the family, they would like to take the patient back home, she has 24/7 help at home arranged.  Nobody is there to help tonight but will be starting tomorrow morning.  We will keep in the emergency department overnight and discharge in the morning.  Patient has no acute medical findings and psychiatry believes the patient is safe for discharge home with family.   Minna Antis, MD 02/10/19 [id number]

## 2019-02-10 NOTE — ED Notes (Signed)
She has awakened - assistance provided to the BR  - offered her medications as ordered by the MD  Pt states  "I have already taken my pills this morning and I am not going to take any more until tonight  - no  I am not taking any pills"

## 2019-02-10 NOTE — ED Notes (Signed)
Pt sleeping   siderails up x 2. 
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
## 2019-02-10 NOTE — ED Notes (Signed)
BEHAVIORAL HEALTH ROUNDING Patient sleeping: No. Patient alert : yes Behavior appropriate: Yes.  ; If no, describe:  Nutrition and fluids offered: yes Toileting and hygiene offered: Yes  Sitter present: q15 minute observations and security monitoring Law enforcement present: Yes  ODS  

## 2019-02-10 NOTE — ED Notes (Signed)
Pt to bathroom with assistance.

## 2019-02-10 NOTE — ED Notes (Signed)
Pt up to bathroom with assistance 
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
## 2019-02-10 NOTE — ED Notes (Signed)
Pt took her pills with much encouragement  - stating  "You better not put those pills in a cup - I will only take them out of my own hand - what doctor says I have a bladder infection - you know doctors don't know anything - I have not seen him"  Pt told that the doctor was the lady talking to her a little bit ago - she states  "Oh yeah - she said I can go home"    Pt reassured  Pills swallowed whole with water
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
## 2019-02-10 NOTE — ED Notes (Signed)
Pt up to bathroom with assistance.  Pt alert.   

## 2019-02-11 ENCOUNTER — Other Ambulatory Visit: Payer: Self-pay

## 2019-02-11 DIAGNOSIS — R4689 Other symptoms and signs involving appearance and behavior: Secondary | ICD-10-CM | POA: Diagnosis not present

## 2019-02-11 DIAGNOSIS — R413 Other amnesia: Secondary | ICD-10-CM | POA: Diagnosis not present

## 2019-02-11 MED ORDER — OLANZAPINE 5 MG PO TBDP: ORAL_TABLET | ORAL | 1 refills | Status: DC

## 2019-02-11 MED ORDER — CIPROFLOXACIN HCL 500 MG PO TABS: 500.0000 mg | ORAL_TABLET | Freq: Two times a day (BID) | ORAL | 0 refills | Status: AC

## 2019-02-11 MED ORDER — OLANZAPINE 5 MG PO TABS: 5.0000 mg | ORAL_TABLET | Freq: Two times a day (BID) | ORAL | 0 refills | Status: DC

## 2019-02-11 NOTE — Consult Note (Signed)
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 and calendar           - Provide sensory aids like glasses, hearing aids           - Encourage ambulation, regular activities and visitors to maintain cognitive stimulation   Recommend sleep study after patient discharged from hospital and medically stable to evaluate for abnormal nighttime behaviors.  Disposition: No evidence of imminent risk to self or others at present.   Supportive therapy provided about ongoing stressors. Discussed crisis plan, support from social network, calling 911, coming to the Emergency Department, and calling Suicide Hotline. Provided with  contact information for outpatient psychiatrist.   She was able to engage in safety planning including plan to return to nearest emergency room or contact emergency services if she feels unable to maintain her own safety or the safety of others. Patient had no further questions, comments, or concerns.  Discharge into care of husband and daughter, who agrees to maintain patient safety.    Mariel Craft, MD 02/11/2019 10:05 AM

## 2019-02-11 NOTE — Discharge Instructions (Signed)
Please ensure that patient has EKG in one month.  Schedule appointment with Dr. Elvera Maria office (psychiatry) [facility]  [phone]  [address] [place], [place] [address]   If Ziarra continues to have abnormal sleep behaviors, please consider an overnight in lab sleep study.

## 2019-02-11 NOTE — ED Notes (Signed)
Pt to bathroom with assistance.

## 2019-02-11 NOTE — ED Notes (Signed)
Patient refused to take shower, patient states I will take a bath when I get back where I came from and thank you for asking.

## 2019-02-11 NOTE — ED Notes (Signed)
Patient was given a warm wash cloth to wash her face and hands at this time breakfast was set up for patient

## 2019-02-11 NOTE — Progress Notes (Unsigned)
Daughter called, due to Zyprexa unavailable at her local pharmacy.  I have sent the prescription to [facility], and will send a one month 30 day prescription of Zyprexa 5 mg BID tablet to local pharmacy.

## 2019-02-11 NOTE — ED Notes (Addendum)
Patient discharged home with daughter Buzzy Han), patient and daughter received discharge papers and prescription for Zyprexa.. Patient received belongings and verbalized she has received all of her belongings. Patient appropriate and cooperative, Denies SI/HI AVH. Vital signs taken. NAD noted.

## 2019-02-11 NOTE — ED Notes (Signed)
Patient took medication with no problems

## 2019-02-11 NOTE — ED Provider Notes (Signed)
Cleared for d/c by Dr. Rebecca Eaton of psychiatry   Jene Every, MD 02/11/19 [phone]

## 2019-02-11 NOTE — ED Notes (Signed)
Hourly rounding reveals patient sleeping in room. No complaints, stable, in no acute distress. Q15 minute rounds and monitoring via Security to continue. 

## 2019-02-11 NOTE — ED Notes (Signed)
Report off to jennifer I rn.  

## 2019-02-12 ENCOUNTER — Telehealth: Payer: Self-pay

## 2019-02-12 NOTE — Telephone Encounter (Signed)
I contacted the pt's husband Ree Kida) ok per dpr in regards to Ms. Huante 330 pm appt for 02/13/19.  I advised, due to current COVID 19 pandemic, our office is severely reducing in office visits until further notice, in order to minimize the risk to our patients and healthcare providers.   I offered a video visit and husband was interested but requested I call his daughter Roddie Mc, also ok per dpr) to help with scheduling. I contacted the pt's daughter and advised of the same.  She was agreeable with helping her mom for a virtual video visit.   She understands that although there may be some limitations with this type of visit, we will take all precautions to reduce any security or privacy concerns.  She understands that this will be treated like an in office visit and we will file with pt's insurance, and there may be a patient responsible charge related to this service.  Daughter requested I send the e-mail link to [email or web address] and to [email or web address]   Pt's meds, allergies and pmh have been updated.  Pt was last seen in [facility] ED for behavioral disturbances.

## 2019-02-13 ENCOUNTER — Other Ambulatory Visit: Payer: Self-pay

## 2019-02-13 ENCOUNTER — Ambulatory Visit (INDEPENDENT_AMBULATORY_CARE_PROVIDER_SITE_OTHER): Payer: Medicare Other | Admitting: Neurology

## 2019-02-13 ENCOUNTER — Encounter: Payer: Self-pay | Admitting: Neurology

## 2019-02-13 DIAGNOSIS — G500 Trigeminal neuralgia: Secondary | ICD-10-CM

## 2019-02-13 DIAGNOSIS — F0391 Unspecified dementia with behavioral disturbance: Secondary | ICD-10-CM | POA: Diagnosis not present

## 2019-02-13 DIAGNOSIS — F0390 Unspecified dementia without behavioral disturbance: Secondary | ICD-10-CM

## 2019-02-13 DIAGNOSIS — F0781 Postconcussional syndrome: Secondary | ICD-10-CM | POA: Diagnosis not present

## 2019-02-13 HISTORY — DX: Unspecified dementia, unspecified severity, without behavioral disturbance, psychotic disturbance, mood disturbance, and anxiety: F03.90

## 2019-02-13 LAB — URINE CULTURE: Culture: 10000 — AB

## 2019-02-13 MED ORDER — OLANZAPINE 2.5 MG PO TABS: ORAL_TABLET | ORAL | 3 refills | Status: DC

## 2019-02-13 NOTE — Progress Notes (Signed)
     Virtual Visit via Video Note  I connected with Suzanne Russo on 02/13/19 at  3:30 PM EDT by a video enabled telemedicine application and verified that I am speaking with the correct person using two identifiers.  Location: Patient: The patient is at home. Provider: Physician in office.   I discussed the limitations of evaluation and management by telemedicine and the availability of in person appointments. The patient expressed understanding and agreed to proceed.  History of Present Illness: Suzanne Russo is an 85 year old right-handed white female with a history of trigeminal neuralgia.  The patient has sustained a significant concussion that occurred on 13 November 2018.  The patient had intracranial bleeding, she did have some memory troubles prior to the fall.  Since the concussion, the patient has had a significant alteration in her cognitive functioning level.  She is living at home with her husband, they now have 24/7 assistance at home.  The patient was in the emergency room on 10 Feb 2019 with a 3-day history of increased confusion and agitation.  The patient underwent a CT scan of the brain that was unremarkable, this was reviewed online.  The patient was placed on Zyprexa taking 5 mg twice daily, but the patient is actually receiving 2.5 mg in the morning and 5 mg in the evening.  The patient admits to being somewhat depressed and anxious, she denies any hallucinations.  He does have occasional headaches but this is not a big problem for her and she does occasionally have dizziness.  She does have some gait instability, she has not had a fall recently.  She reports that at times she does not sleep well at night but she will sleep a lot during the daytime.  She is eating and drinking fairly well she claims.   Observations/Objective: The video evaluation reveals that the patient is alert, she will converse well, no aphasia or dysarthria is noted.  Extraocular movements are full.  The  patient will be agitated at times, sometimes crying.  The [facility]- blind evaluation reveals a score of 6/22.  Assessment and Plan: 1.  Postconcussive syndrome  2.  Memory disorder, exacerbation following concussion, dementia  3.  Recent agitation  The patient is on Zyprexa, I will send in a prescription for the 2.5 mg tablet so the family does not have to break tablets in half, we will see how the Zyprexa works for her over the next several days, we may have to go up or down on the prescription dose.  The patient will follow-up in about 4 months.  The recent carbamazepine level in the ER was therapeutic.  Follow Up Instructions: 54-month follow-up with me.   I discussed the assessment and treatment plan with the patient. The patient was provided an opportunity to ask questions and all were answered. The patient agreed with the plan and demonstrated an understanding of the instructions.   The patient was advised to call back or seek an in-person evaluation if the symptoms worsen or if the condition fails to improve as anticipated.  I provided 30 minutes of non-face-to-face time during this encounter.   York Spaniel, MD

## 2019-02-19 ENCOUNTER — Telehealth: Payer: Self-pay

## 2019-02-19 NOTE — Telephone Encounter (Signed)
PA for Zyprexa has been initiated.   (Key: [id number])   Your information has been submitted to [facility]. [employer] [place] will review the request and notify you of the determination decision directly, typically within 3 business days of your submission and once all necessary information is received. You will also receive your request decision electronically. To check for an update later, open the request again from your dashboard. If [employer] has not responded within the specified timeframe or if you have any questions about your PA submission, contact [employer]  directly at [facility]) [phone] or (PDP) [phone].

## 2019-02-20 NOTE — Telephone Encounter (Signed)
PA for Zyprexa has been approved through [employer]. Member ID [id number]. Approval effective 02/19/19-02/19/2020.

## 2019-02-21 ENCOUNTER — Other Ambulatory Visit: Payer: Self-pay

## 2019-02-21 ENCOUNTER — Other Ambulatory Visit: Payer: Medicare Other | Admitting: Student

## 2019-02-21 DIAGNOSIS — Z515 Encounter for palliative care: Secondary | ICD-10-CM

## 2019-02-21 NOTE — Progress Notes (Signed)
[facility] Palliative [facility] Consult Note Telephone: [phone]  Fax: [phone]  PATIENT NAME: Suzanne Russo DOB: October 02, 1932 MRN: [id number]  PRIMARY CARE PROVIDER:   Dion Body, MD  REFERRING PROVIDER:  Dion Body, MD [address], [place] [address]  RESPONSIBLE PARTY: Husband, Rethel Sebek    ASSESSMENT: Palliative Medicine met with patient, husband and private caregiver present for follow up visit. We discussed role of Palliative Medicine. We discussed ongoing goals of care and symptom management. Patient's appetite has improved since last visit. Recommendation to increase omeprazole to 56m daily or take omeprazole 245mBID for stomach pain/discomfort. Continue olanzapine as directed for behaviors. Husband wants to speak with Dr. LiNetty Starringegarding getting approval for therapy to be restarted. Palliative Medicine will continue to provide support and make recommendations and necessary.       RECOMMENDATIONS and PLAN:  1. Code status: DNR. 2. Medical goals of therapy: Will monitor for changes and declines and make recommendations as needed.  3. Symptom management: stomach pain/discomfort: recommendation to increase omeprazole to 4059maily or increase to omeprazole to 26m45mD.  4. Discharge Planning: Patient will continue to reside at home with husband and 24/7 caregivers; [facility]   Palliative Medicine will follow up in 8 weeks or sooner, if needed.   I spent 40 minutes providing this consultation,  from 11:05am to 11:45am. More than 50% of the time in this consultation was spent coordinating communication.   HISTORY OF PRESENT ILLNESS:  Suzanne HARTNEYa 87 y39.  female with multiple medical problems including trigeminal neuralgia, abdominal pain,abnormal weight loss, anemia, hyperlipidemia,hypothyroidism,osteoarthritis,osteoporosis.  Palliative Care was  asked to help address goals of care; she is seen for follow up visit today. She was seen in ER on 02/12/2019 due to increased agitation, combative behaviors; she has since been started on olanzapine. Husband reports behaviors and mood much improved past several days. She also was treated for a urinary tract infection; she denies any further urinary complaints. She has occasional complaints of left shoulder pain; she takes tylenol. She denies shortness of breath, nausea. She still complains of stomach pain at times. She does not want to restart carafate which she has taken in the past.  She still requires assistance with adl's such as with bathing, dressing, meal preparation. She completed therapy; husband will speak with PCP regarding getting ST restarted. She continues to have caregivers in the home 24/7. [facility] is unsure [facility]: [facility]   PPS: 50% HOSPICE ELIGIBILITY/DIAGNOSIS: TBD  PAST MEDICAL HISTORY:  Past Medical History:  Diagnosis Date  . Carpal tunnel syndrome   . Concussion with no loss of consciousness 08/26/2015  . Degenerative arthritis   . Dementia (HCC)[place]7/2020  . DVT (deep venous thrombosis) (HCC)[place]. GERD (gastroesophageal reflux disease)   . Hemorrhoids   . Hypothyroidism   . Memory difficulty 07/16/2014  . Occipital neuralgia   . Occipital neuralgia of left side 12/15/2014  . Osteoporosis   . Pelvic fracture (HCC)[place]. Peptic ulcer disease   . Postconcussive syndrome 12/20/2018  . Right clavicle fracture   . Trigeminal neuralgia   . Venous insufficiency     SOCIAL HX:  Social History   Tobacco Use  . Smoking status: Former Smoker    Packs/day: 1.00    Years: 45.00    Pack years: 45.00  Types: Cigarettes  . Smokeless tobacco: Never Used  Substance Use Topics  . Alcohol use: Yes    Alcohol/week: 7.0 standard drinks    Types: 7 Glasses of wine per week    Comment: wine on occasion    ALLERGIES:  Allergies   Allergen Reactions  . Aricept [Donepezil]     GI upset  . Citalopram Other (See Comments)    Caused bad dreams  . Sulfa Antibiotics Rash  . Tetracycline Rash     PERTINENT MEDICATIONS:  Outpatient Encounter Medications as of 02/21/2019  Medication Sig  . acetaminophen (TYLENOL) 500 MG tablet Take 1,000 mg by mouth every 8 (eight) hours.   . Calcium Carbonate-Vit D-Min (CALTRATE 600+D PLUS MINERALS) 600-800 MG-UNIT TABS Take 1 tablet by mouth 2 (two) times daily.  . carbamazepine (CARBATROL) 200 MG 12 hr capsule Take 1 capsule (200 mg total) by mouth 2 (two) times daily.  . carbamazepine (CARBATROL) 300 MG 12 hr capsule Take 1 capsule (300 mg total) by mouth 2 (two) times daily.  . celecoxib (CELEBREX) 200 MG capsule Take 200 mg by mouth daily.  . Cyanocobalamin (B-12 PO) Take 1,000 mcg by mouth daily.   . hydrocortisone (ANUSOL-HC) 25 MG suppository Place 25 mg rectally 2 (two) times daily as needed for hemorrhoids or anal itching.   . levothyroxine (SYNTHROID, LEVOTHROID) 100 MCG tablet Take 100 mcg by mouth daily before breakfast.  . loratadine-pseudoephedrine (CLARITIN-D 24-HOUR) 10-240 MG 24 hr tablet Take 1 tablet by mouth daily.  . meclizine (ANTIVERT) 25 MG tablet Take 25 mg by mouth every 6 (six) hours as needed.  Marland Kitchen OLANZapine (ZYPREXA) 2.5 MG tablet 1 in the morning, 2 in the evening  . OLANZapine zydis (ZYPREXA) 5 MG disintegrating tablet At bedtime  And may use 2.5 mg twice daily (1/2 tablet) as needed for agitation during the day.  Marland Kitchen omeprazole (PRILOSEC) 20 MG capsule Take 20 mg by mouth daily.  Marland Kitchen triamcinolone cream (KENALOG) 0.1 % Apply 1 application topically 2 (two) times daily. Compounded with eucerin  . zolendronic acid (ZOMETA) 4 MG/5ML injection Inject 4 mg into the vein once. Last received inj 11/07/17   No facility-administered encounter medications on file as of 02/21/2019.     PHYSICAL EXAM:   General: NAD, frail appearing, thin Cardiovascular: regular rate  and rhythm Pulmonary: clear ant fields Abdomen: soft, nontender, + bowel sounds GU: no suprapubic tenderness Extremities: no edema, no joint deformities Skin: no rashes Neurological: Weakness but otherwise nonfocal  Ezekiel Slocumb, NP

## 2019-03-04 ENCOUNTER — Other Ambulatory Visit: Payer: Self-pay

## 2019-03-04 MED ORDER — CARBAMAZEPINE ER 200 MG PO CP12: 200.0000 mg | ORAL_CAPSULE | Freq: Two times a day (BID) | ORAL | 3 refills | Status: AC

## 2019-03-11 ENCOUNTER — Other Ambulatory Visit: Payer: Self-pay | Admitting: Neurology

## 2019-03-14 ENCOUNTER — Other Ambulatory Visit: Payer: Self-pay | Admitting: Neurology

## 2019-03-19 ENCOUNTER — Inpatient Hospital Stay: Payer: Medicare Other

## 2019-03-19 ENCOUNTER — Encounter: Payer: Self-pay | Admitting: *Deleted

## 2019-03-19 ENCOUNTER — Encounter
Admission: EM | Disposition: A | Discharge status: Home Health | Payer: Self-pay | Source: Home / Self Care | Attending: Internal Medicine

## 2019-03-19 ENCOUNTER — Inpatient Hospital Stay: Payer: Medicare Other | Admitting: Anesthesiology

## 2019-03-19 ENCOUNTER — Other Ambulatory Visit: Payer: Self-pay

## 2019-03-19 ENCOUNTER — Inpatient Hospital Stay
Admission: EM | Admit: 2019-03-19 | Discharge: 2019-03-22 | DRG: 481 | Disposition: A | Discharge status: Home Health | Payer: Medicare Other | Attending: Internal Medicine | Admitting: Internal Medicine

## 2019-03-19 ENCOUNTER — Emergency Department: Payer: Medicare Other

## 2019-03-19 DIAGNOSIS — W010XXA Fall on same level from slipping, tripping and stumbling without subsequent striking against object, initial encounter: Secondary | ICD-10-CM | POA: Diagnosis present

## 2019-03-19 DIAGNOSIS — M810 Age-related osteoporosis without current pathological fracture: Secondary | ICD-10-CM | POA: Diagnosis present

## 2019-03-19 DIAGNOSIS — Z681 Body mass index (BMI) 19 or less, adult: Secondary | ICD-10-CM | POA: Diagnosis not present

## 2019-03-19 DIAGNOSIS — F0391 Unspecified dementia with behavioral disturbance: Secondary | ICD-10-CM | POA: Diagnosis present

## 2019-03-19 DIAGNOSIS — S72141A Displaced intertrochanteric fracture of right femur, initial encounter for closed fracture: Principal | ICD-10-CM | POA: Diagnosis present

## 2019-03-19 DIAGNOSIS — D649 Anemia, unspecified: Secondary | ICD-10-CM | POA: Diagnosis present

## 2019-03-19 DIAGNOSIS — R627 Adult failure to thrive: Secondary | ICD-10-CM | POA: Diagnosis present

## 2019-03-19 DIAGNOSIS — E875 Hyperkalemia: Secondary | ICD-10-CM | POA: Diagnosis present

## 2019-03-19 DIAGNOSIS — Z87891 Personal history of nicotine dependence: Secondary | ICD-10-CM | POA: Diagnosis not present

## 2019-03-19 DIAGNOSIS — Y92513 Shop (commercial) as the place of occurrence of the external cause: Secondary | ICD-10-CM

## 2019-03-19 DIAGNOSIS — Z66 Do not resuscitate: Secondary | ICD-10-CM | POA: Diagnosis present

## 2019-03-19 DIAGNOSIS — Z825 Family history of asthma and other chronic lower respiratory diseases: Secondary | ICD-10-CM | POA: Diagnosis not present

## 2019-03-19 DIAGNOSIS — Z1159 Encounter for screening for other viral diseases: Secondary | ICD-10-CM

## 2019-03-19 DIAGNOSIS — M25511 Pain in right shoulder: Secondary | ICD-10-CM | POA: Diagnosis present

## 2019-03-19 DIAGNOSIS — M25531 Pain in right wrist: Secondary | ICD-10-CM | POA: Diagnosis present

## 2019-03-19 DIAGNOSIS — S72001A Fracture of unspecified part of neck of right femur, initial encounter for closed fracture: Secondary | ICD-10-CM | POA: Diagnosis present

## 2019-03-19 DIAGNOSIS — Z8249 Family history of ischemic heart disease and other diseases of the circulatory system: Secondary | ICD-10-CM

## 2019-03-19 DIAGNOSIS — R270 Ataxia, unspecified: Secondary | ICD-10-CM | POA: Diagnosis present

## 2019-03-19 DIAGNOSIS — Z8711 Personal history of peptic ulcer disease: Secondary | ICD-10-CM

## 2019-03-19 DIAGNOSIS — S72009A Fracture of unspecified part of neck of unspecified femur, initial encounter for closed fracture: Secondary | ICD-10-CM | POA: Diagnosis present

## 2019-03-19 DIAGNOSIS — Z86718 Personal history of other venous thrombosis and embolism: Secondary | ICD-10-CM

## 2019-03-19 DIAGNOSIS — E039 Hypothyroidism, unspecified: Secondary | ICD-10-CM | POA: Diagnosis present

## 2019-03-19 DIAGNOSIS — E876 Hypokalemia: Secondary | ICD-10-CM | POA: Diagnosis not present

## 2019-03-19 HISTORY — PX: INTRAMEDULLARY (IM) NAIL INTERTROCHANTERIC: SHX5875

## 2019-03-19 LAB — COMPREHENSIVE METABOLIC PANEL
ALT: 16 U/L (ref 0–44)
AST: 25 U/L (ref 15–41)
Albumin: 3.6 g/dL (ref 3.5–5.0)
Alkaline Phosphatase: 57 U/L (ref 38–126)
Anion gap: 6 (ref 5–15)
BUN: 21 mg/dL (ref 8–23)
CO2: 25 mmol/L (ref 22–32)
Calcium: 7.8 mg/dL — ABNORMAL LOW (ref 8.9–10.3)
Chloride: 105 mmol/L (ref 98–111)
Creatinine, Ser: 0.51 mg/dL (ref 0.44–1.00)
GFR calc Af Amer: >60 mL/min (ref >60)
GFR calc non Af Amer: >60 mL/min (ref >60)
Glucose, Bld: 117 mg/dL — ABNORMAL HIGH (ref 70–99)
Potassium: 5.4 mmol/L — ABNORMAL HIGH (ref 3.5–5.1)
Sodium: 136 mmol/L (ref 135–145)
Total Bilirubin: 0.7 mg/dL (ref 0.3–1.2)
Total Protein: 6.6 g/dL (ref 6.5–8.1)

## 2019-03-19 LAB — CBC WITH DIFFERENTIAL/PLATELET
Abs Immature Granulocytes: 0.02 10*3/uL (ref 0.00–0.07)
Basophils Absolute: 0.1 10*3/uL (ref 0.0–0.1)
Basophils Relative: 1 %
Eosinophils Absolute: 0.1 10*3/uL (ref 0.0–0.5)
Eosinophils Relative: 2 %
HCT: 30.4 % — ABNORMAL LOW (ref 36.0–46.0)
Hemoglobin: 9.8 g/dL — ABNORMAL LOW (ref 12.0–15.0)
Immature Granulocytes: 0 %
Lymphocytes Relative: 16 %
Lymphs Abs: 0.9 10*3/uL (ref 0.7–4.0)
MCH: 30.2 pg (ref 26.0–34.0)
MCHC: 32.2 g/dL (ref 30.0–36.0)
MCV: 93.5 fL (ref 80.0–100.0)
Monocytes Absolute: 0.5 10*3/uL (ref 0.1–1.0)
Monocytes Relative: 9 %
Neutro Abs: 4.3 10*3/uL (ref 1.7–7.7)
Neutrophils Relative %: 72 %
Platelets: 200 10*3/uL (ref 150–400)
RBC: 3.25 MIL/uL — ABNORMAL LOW (ref 3.87–5.11)
RDW: 14.3 % (ref 11.5–15.5)
Smear Review: NORMAL
WBC: 5.9 10*3/uL (ref 4.0–10.5)
nRBC: 0 % (ref 0.0–0.2)

## 2019-03-19 LAB — IRON AND TIBC
Iron: 90 ug/dL (ref 28–170)
Saturation Ratios: 25 % (ref 10.4–31.8)
TIBC: 363 ug/dL (ref 250–450)
UIBC: 273 ug/dL

## 2019-03-19 LAB — SURGICAL PCR SCREEN
MRSA, PCR: NEGATIVE
Staphylococcus aureus: NEGATIVE

## 2019-03-19 LAB — TSH: TSH: 1.658 u[IU]/mL (ref 0.350–4.500)

## 2019-03-19 LAB — SARS CORONAVIRUS 2 BY RT PCR (HOSPITAL ORDER, PERFORMED IN ~~LOC~~ HOSPITAL LAB): SARS Coronavirus 2: NEGATIVE

## 2019-03-19 LAB — FOLATE: Folate: 11.8 ng/mL (ref >5.9)

## 2019-03-19 LAB — FERRITIN: Ferritin: 17 ng/mL (ref 11–307)

## 2019-03-19 SURGERY — FIXATION, FRACTURE, INTERTROCHANTERIC, WITH INTRAMEDULLARY ROD
Anesthesia: General | Site: Hip | Laterality: Right

## 2019-03-19 MED ORDER — OLANZAPINE 5 MG PO TBDP
5.0000 mg | ORAL_TABLET | Freq: Every day | ORAL | Status: DC
  Filled 2019-03-19 (×2): qty 1

## 2019-03-19 MED ORDER — LORATADINE 10 MG PO TABS
10.0000 mg | ORAL_TABLET | Freq: Every day | ORAL | Status: DC
  Administered 2019-03-21: 10 mg via ORAL
  Filled 2019-03-19 (×2): qty 1

## 2019-03-19 MED ORDER — PROPOFOL 10 MG/ML IV BOLUS
INTRAVENOUS | Status: AC
  Filled 2019-03-19: qty 20

## 2019-03-19 MED ORDER — DIPHENHYDRAMINE HCL 12.5 MG/5ML PO ELIX: 12.5000 mg | ORAL_SOLUTION | ORAL | Status: DC | PRN

## 2019-03-19 MED ORDER — PATIROMER SORBITEX CALCIUM 8.4 G PO PACK
8.4000 g | PACK | Freq: Every day | ORAL | Status: DC
  Administered 2019-03-19: 8.4 g via ORAL
  Filled 2019-03-19 (×2): qty 1

## 2019-03-19 MED ORDER — METOCLOPRAMIDE HCL 5 MG/ML IJ SOLN: 5.0000 mg | Freq: Three times a day (TID) | INTRAMUSCULAR | Status: DC | PRN

## 2019-03-19 MED ORDER — PANTOPRAZOLE SODIUM 40 MG PO TBEC
40.0000 mg | DELAYED_RELEASE_TABLET | Freq: Every day | ORAL | Status: DC
  Administered 2019-03-21: 40 mg via ORAL
  Filled 2019-03-19 (×2): qty 1

## 2019-03-19 MED ORDER — CEFAZOLIN SODIUM-DEXTROSE 2-3 GM-%(50ML) IV SOLR
INTRAVENOUS | Status: DC | PRN
  Administered 2019-03-19: 2 g via INTRAVENOUS

## 2019-03-19 MED ORDER — KETAMINE HCL 50 MG/ML IJ SOLN
INTRAMUSCULAR | Status: AC
  Filled 2019-03-19: qty 10

## 2019-03-19 MED ORDER — MECLIZINE HCL 25 MG PO TABS
25.0000 mg | ORAL_TABLET | Freq: Four times a day (QID) | ORAL | Status: DC | PRN
  Filled 2019-03-19: qty 1

## 2019-03-19 MED ORDER — ACETAMINOPHEN 500 MG PO TABS: 1000.0000 mg | ORAL_TABLET | Freq: Three times a day (TID) | ORAL | Status: DC | PRN

## 2019-03-19 MED ORDER — BISACODYL 10 MG RE SUPP: 10.0000 mg | Freq: Every day | RECTAL | Status: DC | PRN

## 2019-03-19 MED ORDER — ADULT MULTIVITAMIN W/MINERALS CH
1.0000 | ORAL_TABLET | Freq: Every day | ORAL | Status: DC
  Administered 2019-03-21: 1 via ORAL
  Filled 2019-03-19: qty 1

## 2019-03-19 MED ORDER — OLANZAPINE 5 MG PO TABS
2.5000 mg | ORAL_TABLET | Freq: Two times a day (BID) | ORAL | Status: DC
  Administered 2019-03-20: 2.5 mg via ORAL
  Filled 2019-03-19: qty 1

## 2019-03-19 MED ORDER — ZOLEDRONIC ACID 4 MG/5ML IV CONC
4.0000 mg | Freq: Once | INTRAVENOUS | Status: DC
  Filled 2019-03-19: qty 5

## 2019-03-19 MED ORDER — ALBUTEROL SULFATE (2.5 MG/3ML) 0.083% IN NEBU: 2.5000 mg | INHALATION_SOLUTION | Freq: Four times a day (QID) | RESPIRATORY_TRACT | Status: DC

## 2019-03-19 MED ORDER — FENTANYL CITRATE (PF) 100 MCG/2ML IJ SOLN
INTRAMUSCULAR | Status: AC
  Filled 2019-03-19: qty 2

## 2019-03-19 MED ORDER — LORATADINE-PSEUDOEPHEDRINE ER 10-240 MG PO TB24: 1.0000 | ORAL_TABLET | Freq: Every day | ORAL | Status: DC

## 2019-03-19 MED ORDER — ACETAMINOPHEN 325 MG PO TABS: 325.0000 mg | ORAL_TABLET | Freq: Four times a day (QID) | ORAL | Status: DC | PRN

## 2019-03-19 MED ORDER — ONDANSETRON HCL 4 MG/2ML IJ SOLN: 4.0000 mg | Freq: Four times a day (QID) | INTRAMUSCULAR | Status: DC | PRN

## 2019-03-19 MED ORDER — CALCIUM CARBONATE-VITAMIN D 500-200 MG-UNIT PO TABS
1.0000 | ORAL_TABLET | Freq: Two times a day (BID) | ORAL | Status: DC
  Administered 2019-03-20 – 2019-03-21 (×3): 1 via ORAL
  Filled 2019-03-19 (×5): qty 1

## 2019-03-19 MED ORDER — SEVOFLURANE IN SOLN
RESPIRATORY_TRACT | Status: AC
  Filled 2019-03-19: qty 250

## 2019-03-19 MED ORDER — DEXTROSE-NACL 5-0.45 % IV SOLN
INTRAVENOUS | Status: DC
  Administered 2019-03-19: via INTRAVENOUS

## 2019-03-19 MED ORDER — EPHEDRINE SULFATE 50 MG/ML IJ SOLN
INTRAMUSCULAR | Status: DC | PRN
  Administered 2019-03-19 (×3): 5 mg via INTRAVENOUS

## 2019-03-19 MED ORDER — ACETAMINOPHEN 10 MG/ML IV SOLN
INTRAVENOUS | Status: AC
  Filled 2019-03-19: qty 100

## 2019-03-19 MED ORDER — HYDROCODONE-ACETAMINOPHEN 5-325 MG PO TABS: 1.0000 | ORAL_TABLET | ORAL | Status: DC | PRN

## 2019-03-19 MED ORDER — PSEUDOEPHEDRINE HCL ER 120 MG PO TB12
120.0000 mg | ORAL_TABLET | Freq: Two times a day (BID) | ORAL | Status: DC
  Administered 2019-03-20 – 2019-03-21 (×3): 120 mg via ORAL
  Filled 2019-03-19 (×6): qty 1

## 2019-03-19 MED ORDER — ONDANSETRON HCL 4 MG PO TABS: 4.0000 mg | ORAL_TABLET | Freq: Four times a day (QID) | ORAL | Status: DC | PRN

## 2019-03-19 MED ORDER — PROPOFOL 500 MG/50ML IV EMUL
INTRAVENOUS | Status: AC
  Filled 2019-03-19: qty 50

## 2019-03-19 MED ORDER — 0.9 % SODIUM CHLORIDE (POUR BTL) OPTIME
TOPICAL | Status: DC | PRN
  Administered 2019-03-19: 22:00:00 500 mL

## 2019-03-19 MED ORDER — ACETAMINOPHEN 10 MG/ML IV SOLN
INTRAVENOUS | Status: DC | PRN
  Administered 2019-03-19: 1000 mg via INTRAVENOUS

## 2019-03-19 MED ORDER — PROPOFOL 10 MG/ML IV BOLUS
INTRAVENOUS | Status: DC | PRN
  Administered 2019-03-19: 90 mg via INTRAVENOUS

## 2019-03-19 MED ORDER — BUPIVACAINE-EPINEPHRINE (PF) 0.5% -1:200000 IJ SOLN
INTRAMUSCULAR | Status: DC | PRN
  Administered 2019-03-19: 30 mL

## 2019-03-19 MED ORDER — DOCUSATE SODIUM 100 MG PO CAPS
100.0000 mg | ORAL_CAPSULE | Freq: Two times a day (BID) | ORAL | Status: DC
  Administered 2019-03-20 – 2019-03-21 (×4): 100 mg via ORAL
  Filled 2019-03-19 (×4): qty 1

## 2019-03-19 MED ORDER — ACETAMINOPHEN 500 MG PO TABS
500.0000 mg | ORAL_TABLET | Freq: Four times a day (QID) | ORAL | Status: AC
  Administered 2019-03-20 (×2): 500 mg via ORAL
  Filled 2019-03-19 (×2): qty 1

## 2019-03-19 MED ORDER — CEFAZOLIN SODIUM-DEXTROSE 2-4 GM/100ML-% IV SOLN
2.0000 g | Freq: Four times a day (QID) | INTRAVENOUS | Status: AC
  Administered 2019-03-20 (×3): 2 g via INTRAVENOUS
  Filled 2019-03-19 (×3): qty 100

## 2019-03-19 MED ORDER — SENNOSIDES-DOCUSATE SODIUM 8.6-50 MG PO TABS: 1.0000 | ORAL_TABLET | Freq: Every evening | ORAL | Status: DC | PRN

## 2019-03-19 MED ORDER — ALBUTEROL SULFATE (2.5 MG/3ML) 0.083% IN NEBU: 2.5000 mg | INHALATION_SOLUTION | Freq: Four times a day (QID) | RESPIRATORY_TRACT | Status: DC | PRN

## 2019-03-19 MED ORDER — CARBAMAZEPINE ER 100 MG PO TB12
300.0000 mg | ORAL_TABLET | Freq: Two times a day (BID) | ORAL | Status: DC
  Administered 2019-03-21 (×2): 300 mg via ORAL
  Filled 2019-03-19 (×8): qty 3

## 2019-03-19 MED ORDER — ROCURONIUM BROMIDE 100 MG/10ML IV SOLN
INTRAVENOUS | Status: DC | PRN
  Administered 2019-03-19: 20 mg via INTRAVENOUS
  Administered 2019-03-19: 30 mg via INTRAVENOUS

## 2019-03-19 MED ORDER — MORPHINE SULFATE (PF) 2 MG/ML IV SOLN: 2.0000 mg | INTRAVENOUS | Status: DC | PRN

## 2019-03-19 MED ORDER — BUPIVACAINE-EPINEPHRINE (PF) 0.5% -1:200000 IJ SOLN
INTRAMUSCULAR | Status: AC
  Filled 2019-03-19: qty 30

## 2019-03-19 MED ORDER — TRIAMCINOLONE ACETONIDE 0.1 % EX CREA
1.0000 "application " | TOPICAL_CREAM | Freq: Two times a day (BID) | CUTANEOUS | Status: DC
  Administered 2019-03-20 – 2019-03-21 (×2): 1 via TOPICAL
  Filled 2019-03-19: qty 15

## 2019-03-19 MED ORDER — MORPHINE SULFATE (PF) 4 MG/ML IV SOLN: 0.5000 mg | INTRAVENOUS | Status: DC | PRN

## 2019-03-19 MED ORDER — SUGAMMADEX SODIUM 200 MG/2ML IV SOLN
INTRAVENOUS | Status: DC | PRN
  Administered 2019-03-19: 110 mg via INTRAVENOUS

## 2019-03-19 MED ORDER — TRAMADOL HCL 50 MG PO TABS
50.0000 mg | ORAL_TABLET | Freq: Four times a day (QID) | ORAL | Status: DC | PRN
  Administered 2019-03-20: 50 mg via ORAL
  Filled 2019-03-19: qty 1

## 2019-03-19 MED ORDER — METOCLOPRAMIDE HCL 10 MG PO TABS: 5.0000 mg | ORAL_TABLET | Freq: Three times a day (TID) | ORAL | Status: DC | PRN

## 2019-03-19 MED ORDER — ONDANSETRON HCL 4 MG/2ML IJ SOLN
INTRAMUSCULAR | Status: DC | PRN
  Administered 2019-03-19: 4 mg via INTRAVENOUS

## 2019-03-19 MED ORDER — VITAMIN B-12 1000 MCG PO TABS
1000.0000 ug | ORAL_TABLET | Freq: Every day | ORAL | Status: DC
  Filled 2019-03-19: qty 1

## 2019-03-19 MED ORDER — HYDROCODONE-ACETAMINOPHEN 5-325 MG PO TABS
1.0000 | ORAL_TABLET | ORAL | Status: DC | PRN
  Administered 2019-03-20: 2 via ORAL
  Administered 2019-03-20 – 2019-03-21 (×3): 1 via ORAL
  Filled 2019-03-19 (×2): qty 1
  Filled 2019-03-19: qty 2
  Filled 2019-03-19: qty 1

## 2019-03-19 MED ORDER — FENTANYL CITRATE (PF) 100 MCG/2ML IJ SOLN: 25.0000 ug | INTRAMUSCULAR | Status: DC | PRN

## 2019-03-19 MED ORDER — FENTANYL CITRATE (PF) 100 MCG/2ML IJ SOLN
INTRAMUSCULAR | Status: DC | PRN
  Administered 2019-03-19: 50 ug via INTRAVENOUS
  Administered 2019-03-19 (×2): 25 ug via INTRAVENOUS
  Administered 2019-03-19 (×2): 12.5 ug via INTRAVENOUS

## 2019-03-19 MED ORDER — BISACODYL 5 MG PO TBEC: 5.0000 mg | DELAYED_RELEASE_TABLET | Freq: Every day | ORAL | Status: DC | PRN

## 2019-03-19 MED ORDER — MAGNESIUM HYDROXIDE 400 MG/5ML PO SUSP: 30.0000 mL | Freq: Every day | ORAL | Status: DC | PRN

## 2019-03-19 MED ORDER — HYDROCORTISONE ACETATE 25 MG RE SUPP
25.0000 mg | Freq: Two times a day (BID) | RECTAL | Status: DC | PRN
  Filled 2019-03-19: qty 1

## 2019-03-19 MED ORDER — ONDANSETRON HCL 4 MG/2ML IJ SOLN: 4.0000 mg | Freq: Once | INTRAMUSCULAR | Status: DC | PRN

## 2019-03-19 MED ORDER — FLEET ENEMA 7-19 GM/118ML RE ENEM: 1.0000 | ENEMA | Freq: Once | RECTAL | Status: DC | PRN

## 2019-03-19 MED ORDER — SUGAMMADEX SODIUM 200 MG/2ML IV SOLN
INTRAVENOUS | Status: AC
  Filled 2019-03-19: qty 2

## 2019-03-19 MED ORDER — CARBAMAZEPINE ER 200 MG PO TB12
200.0000 mg | ORAL_TABLET | Freq: Two times a day (BID) | ORAL | Status: DC
  Administered 2019-03-20 – 2019-03-22 (×5): 200 mg via ORAL
  Filled 2019-03-19 (×8): qty 1

## 2019-03-19 MED ORDER — ENOXAPARIN SODIUM 40 MG/0.4ML ~~LOC~~ SOLN
40.0000 mg | SUBCUTANEOUS | Status: DC
  Administered 2019-03-20 – 2019-03-21 (×2): 40 mg via SUBCUTANEOUS
  Filled 2019-03-19 (×2): qty 0.4

## 2019-03-19 MED ORDER — LEVOTHYROXINE SODIUM 100 MCG PO TABS
100.0000 ug | ORAL_TABLET | Freq: Every day | ORAL | Status: DC
  Administered 2019-03-20 – 2019-03-22 (×3): 100 ug via ORAL
  Filled 2019-03-19 (×3): qty 1

## 2019-03-19 SURGICAL SUPPLY — 44 items
BIT DRILL 4.3MMS DISTAL GRDTED (BIT) ×1 IMPLANT
BNDG COHESIVE 4X5 TAN STRL (GAUZE/BANDAGES/DRESSINGS) ×3 IMPLANT
BNDG COHESIVE 6X5 TAN STRL LF (GAUZE/BANDAGES/DRESSINGS) ×3 IMPLANT
CANISTER SUCT 1200ML W/VALVE (MISCELLANEOUS) ×3 IMPLANT
CHLORAPREP W/TINT 26 (MISCELLANEOUS) ×6 IMPLANT
COVER WAND RF STERILE (DRAPES) ×3 IMPLANT
DECANTER SPIKE VIAL GLASS SM (MISCELLANEOUS) ×3 IMPLANT
DRAPE C-ARMOR (DRAPES) ×3 IMPLANT
DRAPE SHEET LG 3/4 BI-LAMINATE (DRAPES) ×3 IMPLANT
DRILL 4.3MMS DISTAL GRADUATED (BIT) ×3
DRSG OPSITE POSTOP 3X4 (GAUZE/BANDAGES/DRESSINGS) ×6 IMPLANT
DRSG OPSITE POSTOP 4X6 (GAUZE/BANDAGES/DRESSINGS) ×3 IMPLANT
ELECT CAUTERY BLADE 6.4 (BLADE) ×3 IMPLANT
ELECT REM PT RETURN 9FT ADLT (ELECTROSURGICAL) ×3
ELECTRODE REM PT RTRN 9FT ADLT (ELECTROSURGICAL) ×1 IMPLANT
GAUZE SPONGE 4X4 12PLY STRL (GAUZE/BANDAGES/DRESSINGS) IMPLANT
GLOVE BIO SURGEON STRL SZ8 (GLOVE) ×6 IMPLANT
GLOVE INDICATOR 8.0 STRL GRN (GLOVE) ×6 IMPLANT
GOWN STRL REUS W/ TWL LRG LVL3 (GOWN DISPOSABLE) ×1 IMPLANT
GOWN STRL REUS W/ TWL XL LVL3 (GOWN DISPOSABLE) ×1 IMPLANT
GOWN STRL REUS W/TWL LRG LVL3 (GOWN DISPOSABLE) ×2
GOWN STRL REUS W/TWL XL LVL3 (GOWN DISPOSABLE) ×2
GUIDEPIN VERSANAIL DSP 3.2X444 (ORTHOPEDIC DISPOSABLE SUPPLIES) ×3 IMPLANT
GUIDEWIRE BALL NOSE 80CM (WIRE) ×3 IMPLANT
HFN RH 130 DEG 9MM X 380MM (Nail) ×3 IMPLANT
HIP FRAC NAIL LAG SCR 10.5X100 (Orthopedic Implant) ×2 IMPLANT
MAT ABSORB  FLUID 56X50 GRAY (MISCELLANEOUS) ×2
MAT ABSORB FLUID 56X50 GRAY (MISCELLANEOUS) ×1 IMPLANT
NEEDLE FILTER BLUNT 18X 1/2SAF (NEEDLE) ×4
NEEDLE FILTER BLUNT 18X1 1/2 (NEEDLE) ×2 IMPLANT
NEEDLE HYPO 22GX1.5 SAFETY (NEEDLE) ×3 IMPLANT
NS IRRIG 1000ML POUR BTL (IV SOLUTION) ×3 IMPLANT
NS IRRIG 500ML POUR BTL (IV SOLUTION) IMPLANT
PACK HIP COMPR (MISCELLANEOUS) ×3 IMPLANT
SCREW BONE CORTICAL 5.0X44 (Screw) ×3 IMPLANT
SCREW CANN THRD AFF 10.5X100 (Orthopedic Implant) ×1 IMPLANT
STAPLER SKIN PROX 35W (STAPLE) ×3 IMPLANT
STRAP SAFETY 5IN WIDE (MISCELLANEOUS) ×3 IMPLANT
SUT VIC AB 0 CT1 36 (SUTURE) ×3 IMPLANT
SUT VIC AB 1 CT1 36 (SUTURE) ×3 IMPLANT
SUT VIC AB 2-0 CT1 (SUTURE) ×6 IMPLANT
SYR 10ML LL (SYRINGE) ×3 IMPLANT
SYR 30ML LL (SYRINGE) ×3 IMPLANT
TAPE MICROFOAM 4IN (TAPE) IMPLANT

## 2019-03-19 NOTE — Progress Notes (Signed)
Pt going to surgery tonight per Dr. Roland Rack. Consent was signed and placed in chart.

## 2019-03-19 NOTE — Transfer of Care (Signed)
Immediate Anesthesia Transfer of Care Note  Patient: Suzanne Russo  Procedure(s) Performed: INTRAMEDULLARY (IM) NAIL INTERTROCHANTRIC (Right Hip)  Patient Location: PACU  Anesthesia Type:General  Level of Consciousness: awake, alert  and patient cooperative  Airway & Oxygen Therapy: Patient Spontanous Breathing and Patient connected to face mask oxygen  Post-op Assessment: Report given to RN and Post -op Vital signs reviewed and stable  Post vital signs: Reviewed and stable  Last Vitals:  Vitals Value Taken Time  BP 161/82 03/19/2019 10:20 PM  Temp 36.3 C 03/19/2019 10:20 PM  Pulse 67 03/19/2019 10:22 PM  Resp 18 03/19/2019 10:22 PM  SpO2 100 % 03/19/2019 10:22 PM  Vitals shown include unvalidated device data.  Last Pain:  Vitals:   03/19/19 2220  TempSrc:   PainSc: (P) Asleep         Complications: No apparent anesthesia complications

## 2019-03-19 NOTE — ED Notes (Signed)
Patient taken to imaging. 

## 2019-03-19 NOTE — ED Notes (Signed)
Husband cell [phone], house [phone].

## 2019-03-19 NOTE — Progress Notes (Signed)
Paged provider and received clarification on medication for her potassium. Asked provider for telemetry awaiting for orders

## 2019-03-19 NOTE — ED Notes (Signed)
ED TO INPATIENT HANDOFF REPORT  ED Nurse Name and Phone #: [phone] Jonette PesaSonjia  S Name/Age/Gender Suzanne Russo 85 y.o. female Room/Bed: ED54A/ED54A  Code Status   Code Status: Prior  Home/SNF/Other Home Patient oriented to: self and situation Is this baseline? Yes   Triage Complete: Triage complete  Chief Complaint hip injury  Triage Note Per EMS report, patient missed chair while at the nail salon. Patient c/o right hip pain, right shoulder and right wrist pain. Patient arrived drowsy from 100mcg Fentanyl and on 2 L of O2 due to low O2 sats after being given Fentanyl.   Allergies Allergies  Allergen Reactions  . Aricept [Donepezil]     GI upset  . Citalopram Other (See Comments)    Caused bad dreams  . Sulfa Antibiotics Rash  . Tetracycline Rash    Level of Care/Admitting Diagnosis ED Disposition    ED Disposition Condition Comment   Admit  Hospital [facility] [[id number]]  Level of Care: Med-Surg [16]  Covid Evaluation: Screening Protocol (No Symptoms)  Diagnosis: Hip fx [facility](HCC) [[id number]]  Admitting Physician: Bertrum SolSALARY, MONTELL D [[id number]]  Attending Physician: Bertrum SolSALARY, MONTELL D [[id number]]  Estimated length of stay: past midnight tomorrow  Certification:: I certify this patient will need inpatient services for at least 2 midnights  PT Class (Do Not Modify): Inpatient [101]  PT Acc Code (Do Not Modify): Private [1]       B Medical/Surgery History Past Medical History:  Diagnosis Date  . Carpal tunnel syndrome   . Concussion with no loss of consciousness 08/26/2015  . Degenerative arthritis   . Dementia (HCC) 02/13/2019  . DVT (deep venous thrombosis) (HCC)   . GERD (gastroesophageal reflux disease)   . Hemorrhoids   . Hypothyroidism   . Memory difficulty 07/16/2014  . Occipital neuralgia   . Occipital neuralgia of left side 12/15/2014  . Osteoporosis   . Pelvic fracture (HCC)   . Peptic ulcer disease   . Postconcussive syndrome 12/20/2018   . Right clavicle fracture   . Trigeminal neuralgia   . Venous insufficiency    Past Surgical History:  Procedure Laterality Date  . bilateral cataract surgery    . bilateral knee surgery    . left hip fracture    . left thumb surgery    . left toe surgery    . NASAL SINUS SURGERY    . TONSILLECTOMY    . VAGINAL HYSTERECTOMY       A IV Location/Drains/Wounds Patient Lines/Drains/Airways Status   Active Line/Drains/Airways    Name:   Placement date:   Placement time:   Site:   Days:   Wound / Incision (Open or Dehisced) 03/18/17 Venous stasis ulcer Foot Left;Medial   03/18/17    1400    Foot   731          Intake/Output Last 24 hours No intake or output data in the 24 hours ending 03/19/19 1516  Labs/Imaging Results for orders placed or performed during the hospital encounter of 03/19/19 (from the past 48 hour(s))  Comprehensive metabolic panel     Status: Abnormal   Collection Time: 03/19/19  1:47 PM  Result Value Ref Range   Sodium 136 135 - 145 mmol/L   Potassium 5.4 (H) 3.5 - 5.1 mmol/L    Comment: HEMOLYSIS AT THIS LEVEL MAY AFFECT RESULT   Chloride 105 98 - 111 mmol/L   CO2 25 22 - 32 mmol/L   Glucose, Bld 117 (H) 70 -  99 mg/dL   BUN 21 8 - 23 mg/dL   Creatinine, Ser 1.610.51 0.44 - 1.00 mg/dL   Calcium 7.8 (L) 8.9 - 10.3 mg/dL   Total Protein 6.6 6.5 - 8.1 g/dL   Albumin 3.6 3.5 - 5.0 g/dL   AST 25 15 - 41 U/L    Comment: HEMOLYSIS AT THIS LEVEL MAY AFFECT RESULT   ALT 16 0 - 44 U/L    Comment: HEMOLYSIS AT THIS LEVEL MAY AFFECT RESULT   Alkaline Phosphatase 57 38 - 126 U/L   Total Bilirubin 0.7 0.3 - 1.2 mg/dL    Comment: HEMOLYSIS AT THIS LEVEL MAY AFFECT RESULT   GFR calc non Af Amer >60 >60 mL/min   GFR calc Af Amer >60 >60 mL/min   Anion gap 6 5 - 15    Comment: Performed at [facility], [address] [place], [place] [address]  CBC with Differential     Status: Abnormal   Collection Time: 03/19/19  1:47 PM  Result Value Ref Range   WBC  5.9 4.0 - 10.5 K/uL   RBC 3.25 (L) 3.87 - 5.11 MIL/uL   Hemoglobin 9.8 (L) 12.0 - 15.0 g/dL   HCT 54.030.4 (L) 98.136.0 - 19.146.0 %   MCV 93.5 80.0 - 100.0 fL   MCH 30.2 26.0 - 34.0 pg   MCHC 32.2 30.0 - 36.0 g/dL   RDW 47.814.3 29.511.5 - 62.115.5 %   Platelets 200 150 - 400 K/uL   nRBC 0.0 0.0 - 0.2 %   Neutrophils Relative % 72 %   Neutro Abs 4.3 1.7 - 7.7 K/uL   Lymphocytes Relative 16 %   Lymphs Abs 0.9 0.7 - 4.0 K/uL   Monocytes Relative 9 %   Monocytes Absolute 0.5 0.1 - 1.0 K/uL   Eosinophils Relative 2 %   Eosinophils Absolute 0.1 0.0 - 0.5 K/uL   Basophils Relative 1 %   Basophils Absolute 0.1 0.0 - 0.1 K/uL   Smear Review Normal platelet morphology    Immature Granulocytes 0 %   Abs Immature Granulocytes 0.02 0.00 - 0.07 K/uL    Comment: Performed at [facility], [address] [place], [place] [address]  SARS Coronavirus 2 (CEPHEID - Performed in [facility] lab), Hosp Order     Status: None   Collection Time: 03/19/19  1:47 PM  Result Value Ref Range   SARS Coronavirus 2 NEGATIVE NEGATIVE    Comment: (NOTE) If result is NEGATIVE SARS-CoV-2 target nucleic acids are NOT DETECTED. The SARS-CoV-2 RNA is generally detectable in upper and lower  respiratory specimens during the acute phase of infection. The lowest  concentration of SARS-CoV-2 viral copies this assay can detect is 250  copies / mL. A negative result does not preclude SARS-CoV-2 infection  and should not be used as the sole basis for treatment or other  patient management decisions.  A negative result may occur with  improper specimen collection / handling, submission of specimen other  than nasopharyngeal swab, presence of viral mutation(s) within the  areas targeted by this assay, and inadequate number of viral copies  (<250 copies / mL). A negative result must be combined with clinical  observations, patient history, and epidemiological information. If result is POSITIVE SARS-CoV-2 target nucleic  acids are DETECTED. The SARS-CoV-2 RNA is generally detectable in upper and lower  respiratory specimens dur ing the acute phase of infection.  Positive  results are indicative of active infection with SARS-CoV-2.  Clinical  correlation with patient history and other diagnostic information is  necessary to determine patient infection status.  Positive results do  not rule out bacterial infection or co-infection with other viruses. If result is PRESUMPTIVE POSTIVE SARS-CoV-2 nucleic acids MAY BE PRESENT.   A presumptive positive result was obtained on the submitted specimen  and confirmed on repeat testing.  While 2019 novel coronavirus  (SARS-CoV-2) nucleic acids may be present in the submitted sample  additional confirmatory testing may be necessary for epidemiological  and / or clinical management purposes  to differentiate between  SARS-CoV-2 and other Sarbecovirus currently known to infect humans.  If clinically indicated additional testing with an alternate test  methodology [phone]) is advised. The SARS-CoV-2 RNA is generally  detectable in upper and lower respiratory sp ecimens during the acute  phase of infection. The expected result is Negative. Fact Sheet for Patients:  StrictlyIdeas.no Fact Sheet for Healthcare Providers: BankingDealers.co.za This test is not yet approved or cleared by the [place] FDA and has been authorized for detection and/or diagnosis of SARS-CoV-2 by FDA under an Emergency Use Authorization (EUA).  This EUA will remain in effect (meaning this test can be used) for the duration of the COVID-19 declaration under Section 564(b)(1) of the Act, 21 U.S.C. section 360bbb-3(b)(1), unless the authorization is terminated or revoked sooner. Performed at [facility], [address] [place], [place] [address]    Dg Chest 1 View  Result Date: 03/19/2019 CLINICAL DATA:  Recent fall with known hip  fracture EXAM: CHEST  1 VIEW COMPARISON:  02/10/19 FINDINGS: Multiple old rib fractures are noted on the right. No pneumothorax is seen. No definitive acute rib fractures are noted. Cardiac shadow is at the upper limits of normal in size. No focal infiltrate or effusion is seen. IMPRESSION: Multiple old rib fractures on the right. No other focal abnormality is noted. Electronically Signed   By: Inez Catalina M.D.   On: 03/19/2019 13:49   Dg Shoulder Right  Result Date: 03/19/2019 CLINICAL DATA:  Fall today with right shoulder pain, initial encounter EXAM: RIGHT SHOULDER - 2+ VIEW COMPARISON:  12/25/2013 FINDINGS: Multiple healed rib fractures are noted on the right. No acute fracture or dislocation is noted. No gross soft tissue abnormality is seen. IMPRESSION: Old rib fractures on the right. No acute abnormality about the shoulder. Electronically Signed   By: Inez Catalina M.D.   On: 03/19/2019 13:39   Dg Wrist Complete Right  Result Date: 03/19/2019 CLINICAL DATA:  Recent fall with wrist pain, initial encounter EXAM: RIGHT WRIST - COMPLETE 3+ VIEW COMPARISON:  None. FINDINGS: Degenerative changes are noted in the carpal bones as well as the radiocarpal joint. No acute fracture or dislocation is noted. No soft tissue abnormality is seen. IMPRESSION: Degenerative change without acute abnormality. Electronically Signed   By: Inez Catalina M.D.   On: 03/19/2019 13:43   Dg Hip Unilat W Or Wo Pelvis 2-3 Views Right  Result Date: 03/19/2019 CLINICAL DATA:  Recent fall with right hip pain, initial encounter EXAM: DG HIP (WITH OR WITHOUT PELVIS) 2-3V RIGHT COMPARISON:  None. FINDINGS: Comminuted intratrochanteric fracture is noted in the proximal right femur. Impaction and angulation at the fracture site is noted. Previous pinning of the left hip is seen. Old healed pubic rami fractures on the right are noted. IMPRESSION: Comminuted intratrochanteric right femoral fracture. Electronically Signed   By: Inez Catalina  M.D.   On: 03/19/2019 13:48    Pending Labs [employer] (From  admission, onward)    Start     Ordered   03/20/19 0500  Basic metabolic panel  Tomorrow morning,   STAT     03/19/19 1509   Signed and Held  TSH  Once,   R     Signed and Held   Signed and Held  CBC  Tomorrow morning,   R     Signed and Held          Vitals/Pain Today's Vitals   03/19/19 1220 03/19/19 1224 03/19/19 1327  BP: (!) 165/68  (!) 143/60  Pulse: 60  65  Resp: 14  14  Temp: 97.8 F (36.6 C)    TempSrc: Oral    SpO2: 100%  100%  Weight:  51.3 kg   Height:  5\' 4"  (1.626 m)   PainSc:  10-Worst pain ever 10-Worst pain ever    Isolation Precautions No active isolations  Medications Medications - No data to display  Mobility walks High fall risk   Focused Assessments other   R Recommendations: See Admitting Provider Note  Report given to:   Additional Notes:

## 2019-03-19 NOTE — Progress Notes (Signed)
Received communication from provider advising telemetry was not needed. Also advised that pt husband has called the unit asking for update husband gave the following number to be reached at [phone]

## 2019-03-19 NOTE — ED Notes (Signed)
Patient's husband Stephannie Peters called and states patient has dementia and he is her HCPOA. Husband also states patient had a fall in February 2020 and had a skull fracture. Home [phone], cell [phone].

## 2019-03-19 NOTE — Progress Notes (Signed)
Paged provider regarding the pt K+ Level awaiting response

## 2019-03-19 NOTE — Progress Notes (Signed)
Family Meeting Note  Advance Directive:yes  Today a meeting took place with the Patient, husband.  Patient is unable to participate due XH:FSFSEL capacity dementia   The following clinical team members were present during this meeting:MD  The following were discussed:Patient's diagnosis:85 y.o. female with a known history per below which includes dementia, chronic ataxia, status post mechanical fall while trying to sit down, resulting in right hip pain, in emergency room patient was found to have acute right intratrochanteric femoral fracture, potassium 5.4, hospitalist asked to admit, patient valuated emergency room, no apparent distress, resting comfortably in bed, patient now been admitted for acute right intertrochanteric femoral fracture status post mechanical fall, Patient's progosis: Unable to determine and Goals for treatment: DNR  Additional follow-up to be provided: prn  Time spent during discussion:20 minutes  Gorden Harms, MD

## 2019-03-19 NOTE — ED Notes (Signed)
Patient is back from imaging. 

## 2019-03-19 NOTE — Progress Notes (Signed)
Spoke to pt husband and he wanted to let me know the following. Pt suffers from vivid dreams and has a tendency to sleep walk. Pt primary care doctor and neurologist aware. One of the reason she fell in February was due to vivid dreams and sleep walking. Since injury in February pt is not able to understand written word because she is unable to process written word. Pt also has mild dementia and has a tendency to sundown.

## 2019-03-19 NOTE — ED Triage Notes (Signed)
Per EMS report, patient missed chair while at the nail salon. Patient c/o right hip pain, right shoulder and right wrist pain. Patient arrived drowsy from 145mcg Fentanyl and on 2 L of O2 due to low O2 sats after being given Fentanyl.

## 2019-03-19 NOTE — Anesthesia Procedure Notes (Signed)
Procedure Name: Intubation Date/Time: 03/19/2019 8:49 PM Performed by: Lendon Colonel, CRNA Pre-anesthesia Checklist: Patient identified, Patient being monitored, Timeout performed, Emergency Drugs available and Suction available Patient Re-evaluated:Patient Re-evaluated prior to induction Oxygen Delivery Method: Circle system utilized Preoxygenation: Pre-oxygenation with 100% oxygen Induction Type: IV induction Ventilation: Mask ventilation without difficulty Laryngoscope Size: McGraph and 4 Grade View: Grade II Tube type: Oral Tube size: 7.0 mm Number of attempts: 1 Airway Equipment and Method: Stylet Placement Confirmation: ETT inserted through vocal cords under direct vision,  positive ETCO2 and breath sounds checked- equal and bilateral Secured at: 20 cm Tube secured with: Tape Dental Injury: Teeth and Oropharynx as per pre-operative assessment

## 2019-03-19 NOTE — Op Note (Signed)
03/19/2019  10:22 PM  Patient:   Suzanne Russo  Pre-Op Diagnosis:   Closed displaced three-part intertrochanteric fracture, right hip.  Post-Op Diagnosis:   Same  Procedure:   Reduction and internal fixation of displaced intertrochanteric right hip fracture with Biomet Affixis TFN nail.  Surgeon:   Maryagnes AmosJ. Jeffrey , MD  Assistant:   None  Anesthesia:   GET  Findings:   As above  Complications:   None  EBL:   50 cc  Fluids:   700 cc crystalloid  UOP:   450 cc  TT:   None  Drains:   None  Closure:   Staples  Implants:   Biomet Affixis 9 x 380 mm TFN with a 100 mm lag screw and a 44 mm distal interlocking screw  Brief Clinical Note:   The patient is an 85 year old female who sustained the above-noted injury earlier today when she apparently lost her balance while trying to sit down at a hair salon and landed on her right hip. She was brought to the emergency room where x-rays demonstrated the above-noted fracture. The patient has been cleared medically and presents at this time for reduction and internal fixation of the displaced intertrochanteric right hip fracture.  Procedure:   The patient was brought into the operating room and lain in the supine position. After adequate general endotracheal intubation and anesthesia was obtained, the patient was repositioned on the fracture table. The uninjured leg was placed in a flexed and abducted position while the injured lower extremity was placed in longitudinal traction. The fracture was reduced using longitudinal traction and internal rotation. The adequacy of reduction was verified fluoroscopically in AP and lateral projections and found to be near anatomic. The lateral aspects of the right hip and thigh were prepped with ChloraPrep solution before being draped sterilely. Preoperative antibiotics were administered. A timeout was performed to verify the appropriate surgical site. The greater trochanter was identified fluoroscopically  and an approximately 3 cm incision made about 2-3 fingerbreadths above the tip of the greater trochanter. The incision was carried down through the subcutaneous tissues to expose the gluteal fascia. This was split the length of the incision, providing access to the tip of the trochanter. Under fluoroscopic guidance, a guidewire was drilled through the tip of the trochanter into the proximal metaphysis to the level of the lesser trochanter. After verifying its position fluoroscopically in AP and lateral projections, it was overreamed with the initial reamer to the depth of the lesser trochanter. A guidewire was passed down through the femoral canal to the supracondylar region. The adequacy of guidewire position was verified fluoroscopically in AP and lateral projections before the length of the guidewire within the canal was measured and found to be 380 mm. Therefore, a 380 mm length nail was selected. The guidewire was overreamed sequentially using the flexible reamers, beginning with a 9.5 mm reamer and progressing to a 10.5 mm reamer. This provided good cortical chatter. The 9 x 380 mm Biomet Affixis TFN rod was selected and advanced to the appropriate depth, as verified fluoroscopically.   The guide system for the lag screw was positioned and advanced through an approximately 2 cm stab incision over the lateral aspect of the proximal femur. The guidewire was drilled up through the trochanteric femoral nail and into the femoral neck to rest within 5 mm of subchondral bone. After verifying its position in the femoral neck and head in both AP and lateral projections, the guidewire was measured and found  to be optimally replicated by a 785 mm lag screw. The guidewire was overreamed to the appropriate depth before the lag screw was inserted and advanced to the appropriate depth as verified fluoroscopically in AP and lateral projections. Gentle compression was applied across the fracture using the appropriate  compression device before the locking screw was advanced, then backed off a quarter turn to set the lag screw. Again the adequacy of hardware position and fracture reduction was verified fluoroscopically in AP and lateral projections and found to be excellent.  Attention was directed distally. Using the "perfect circle" technique, the leg and fluoroscopy machine were positioned appropriately. An approximately 1.5 cm stab incision was made over the skin at the appropriate point before the drill bit was advanced through the cortex and across the static hole of the nail. The appropriate length of the screw was determined before the 44 mm distal interlocking screw was positioned, then advanced and tightened securely. Again the adequacy of screw position was verified fluoroscopically in AP and lateral projections and found to be excellent.  The wounds were irrigated thoroughly with sterile saline solution before the abductor fascia was reapproximated using #0 Vicryl interrupted sutures. The subcutaneous tissues were closed using 2-0 Vicryl interrupted sutures. The skin was closed using staples. A total of 30 cc of 0.5% Sensorcaine with epinephrine was injected in and around all incisions. Sterile occlusive dressings were applied to all wounds before the patient was transferred back to his/her hospital bed. The patient was then awakened, extubated, and transferred to the recovery room in satisfactory condition after tolerating the procedure well.

## 2019-03-19 NOTE — Anesthesia Preprocedure Evaluation (Addendum)
Anesthesia Evaluation  Patient identified by MRN, date of birth, ID band Patient awake    Reviewed: Allergy & Precautions, H&P , NPO status , Patient's Chart, lab work & pertinent test results, reviewed documented beta blocker date and time   History of Anesthesia Complications Negative for: history of anesthetic complications  Airway Mallampati: II  TM Distance: >3 FB Neck ROM: full    Dental  (+) Caps, Dental Advidsory Given, Teeth Intact   Pulmonary neg pulmonary ROS, former smoker,           Cardiovascular Exercise Tolerance: Good negative cardio ROS       Neuro/Psych  Headaches, neg Seizures PSYCHIATRIC DISORDERS Dementia  Neuromuscular disease    GI/Hepatic Neg liver ROS, PUD, GERD  ,  Endo/Other  neg diabetesHypothyroidism   Renal/GU negative Renal ROS  negative genitourinary   Musculoskeletal   Abdominal   Peds  Hematology negative hematology ROS (+)   Anesthesia Other Findings Past Medical History: No date: Carpal tunnel syndrome 08/26/2015: Concussion with no loss of consciousness No date: Degenerative arthritis 02/13/2019: Dementia ([place]) No date: DVT (deep venous thrombosis) ([place]) No date: GERD (gastroesophageal reflux disease) No date: Hemorrhoids No date: Hypothyroidism 07/16/2014: Memory difficulty No date: Occipital neuralgia 12/15/2014: Occipital neuralgia of left side No date: Osteoporosis No date: Pelvic fracture (HCC) No date: Peptic ulcer disease 12/20/2018: Postconcussive syndrome No date: Right clavicle fracture No date: Trigeminal neuralgia No date: Venous insufficiency   Reproductive/Obstetrics negative OB ROS                            Anesthesia Physical Anesthesia Plan  ASA: III  Anesthesia Plan: General   Post-op Pain Management:    Induction: Intravenous  PONV Risk Score and Plan: 3 and Ondansetron and Dexamethasone  Airway Management Planned:  LMA and Oral ETT  Additional Equipment:   Intra-op Plan:   Post-operative Plan: Extubation in OR  Informed Consent: I have reviewed the patients History and Physical, chart, labs and discussed the procedure including the risks, benefits and alternatives for the proposed anesthesia with the patient or authorized representative who has indicated his/her understanding and acceptance.     Dental Advisory Given  Plan Discussed with: Anesthesiologist, CRNA and Surgeon  Anesthesia Plan Comments:         Anesthesia Quick Evaluation

## 2019-03-19 NOTE — Anesthesia Post-op Follow-up Note (Signed)
Anesthesia QCDR form completed.        

## 2019-03-19 NOTE — H&P (Addendum)
[facility] - [place] [facility]   PATIENT NAME: Suzanne Russo    MR#:  [id number]  DATE OF BIRTH:  02-27-1932  DATE OF ADMISSION:  03/19/2019  PRIMARY CARE PHYSICIAN: Marisue IvanLinthavong, Kanhka, MD   REQUESTING/REFERRING PHYSICIAN:   CHIEF COMPLAINT:   Chief Complaint  Patient presents with  . Fall    HISTORY OF PRESENT ILLNESS: Suzanne Offerancy Rembert  is a 85 y.o. female with a known history per below which includes dementia, chronic ataxia, status post mechanical fall while trying to sit down, resulting in right hip pain, in emergency room patient was found to have acute right intratrochanteric femoral fracture, potassium 5.4, hospitalist asked to admit, patient valuated emergency room, no apparent distress, resting comfortably in bed, patient now been admitted for acute right intertrochanteric femoral fracture status post mechanical fall.  PAST MEDICAL HISTORY:   Past Medical History:  Diagnosis Date  . Carpal tunnel syndrome   . Concussion with no loss of consciousness 08/26/2015  . Degenerative arthritis   . Dementia (HCC) 02/13/2019  . DVT (deep venous thrombosis) (HCC)   . GERD (gastroesophageal reflux disease)   . Hemorrhoids   . Hypothyroidism   . Memory difficulty 07/16/2014  . Occipital neuralgia   . Occipital neuralgia of left side 12/15/2014  . Osteoporosis   . Pelvic fracture (HCC)   . Peptic ulcer disease   . Postconcussive syndrome 12/20/2018  . Right clavicle fracture   . Trigeminal neuralgia   . Venous insufficiency     PAST SURGICAL HISTORY:  Past Surgical History:  Procedure Laterality Date  . bilateral cataract surgery    . bilateral knee surgery    . left hip fracture    . left thumb surgery    . left toe surgery    . NASAL SINUS SURGERY    . TONSILLECTOMY    . VAGINAL HYSTERECTOMY      SOCIAL HISTORY:  Social History   Tobacco Use  . Smoking status: Former Smoker    Packs/day: 1.00    Years: 45.00    Pack years: 45.00    Types:  Cigarettes  . Smokeless tobacco: Never Used  Substance Use Topics  . Alcohol use: Yes    Alcohol/week: 7.0 standard drinks    Types: 7 Glasses of wine per week    Comment: wine on occasion    FAMILY HISTORY:  Family History  Problem Relation Age of Onset  . Heart disease Mother   . COPD Father   . Congestive Heart Failure Brother     DRUG ALLERGIES:  Allergies  Allergen Reactions  . Aricept [Donepezil]     GI upset  . Citalopram Other (See Comments)    Caused bad dreams  . Sulfa Antibiotics Rash  . Tetracycline Rash    REVIEW OF SYSTEMS:   CONSTITUTIONAL: No fever, fatigue or weakness.  EYES: No blurred or double vision.  EARS, NOSE, AND THROAT: No tinnitus or ear pain.  RESPIRATORY: No cough, shortness of breath, wheezing or hemoptysis.  CARDIOVASCULAR: No chest pain, orthopnea, edema.  GASTROINTESTINAL: No nausea, vomiting, diarrhea or abdominal pain.  GENITOURINARY: No dysuria, hematuria.  ENDOCRINE: No polyuria, nocturia,  HEMATOLOGY: No anemia, easy bruising or bleeding SKIN: No rash or lesion. MUSCULOSKELETAL: Right hip pain  nEUROLOGIC: No tingling, numbness, weakness.  PSYCHIATRY: No anxiety or depression.   MEDICATIONS AT HOME:  Prior to Admission medications   Medication Sig Start Date End Date Taking? Authorizing Provider  acetaminophen (TYLENOL) 500 MG tablet Take  1,000 mg by mouth every 8 (eight) hours.     [provider]  Calcium Carbonate-Vit D-Min (CALTRATE 600+D PLUS MINERALS) 600-800 MG-UNIT TABS Take 1 tablet by mouth 2 (two) times daily.    [provider]  carbamazepine (CARBATROL) 200 MG 12 hr capsule Take 1 capsule (200 mg total) by mouth 2 (two) times daily. 03/04/19   York SpanielWillis, Charles K, MD  carbamazepine (CARBATROL) 300 MG 12 hr capsule TAKE 1 CAPSULE BY MOUTH TWICE DAILY 03/17/19   York SpanielWillis, Charles K, MD  celecoxib (CELEBREX) 200 MG capsule Take 200 mg by mouth daily. 08/19/15   [provider]  Cyanocobalamin (B-12  PO) Take 1,000 mcg by mouth daily.     [provider]  hydrocortisone (ANUSOL-HC) 25 MG suppository Place 25 mg rectally 2 (two) times daily as needed for hemorrhoids or anal itching.     [provider]  levothyroxine (SYNTHROID, LEVOTHROID) 100 MCG tablet Take 100 mcg by mouth daily before breakfast.    [provider]  loratadine-pseudoephedrine (CLARITIN-D 24-HOUR) 10-240 MG 24 hr tablet Take 1 tablet by mouth daily.    [provider]  meclizine (ANTIVERT) 25 MG tablet Take 25 mg by mouth every 6 (six) hours as needed.    [provider]  OLANZapine (ZYPREXA) 2.5 MG tablet 1 in the morning, 2 in the evening 02/13/19   York SpanielWillis, Charles K, MD  OLANZapine zydis (ZYPREXA) 5 MG disintegrating tablet At bedtime  And may use 2.5 mg twice daily (1/2 tablet) as needed for agitation during the day. 02/11/19   Mariel CraftMaurer, Sheila M, MD  omeprazole (PRILOSEC) 20 MG capsule Take 20 mg by mouth daily.    [provider]  triamcinolone cream (KENALOG) 0.1 % Apply 1 application topically 2 (two) times daily. Compounded with eucerin    [provider]  zolendronic acid (ZOMETA) 4 MG/5ML injection Inject 4 mg into the vein once. Last received inj 11/07/17    [provider]      PHYSICAL EXAMINATION:   VITAL SIGNS: Blood pressure (!) 143/60, pulse 65, temperature 97.8 F (36.6 C), temperature source Oral, resp. rate 14, height 5\' 4"  (1.626 m), weight 51.3 kg, SpO2 100 %.  GENERAL:  85 y.o.-year-old patient lying in the bed with no acute distress.  Malnourished appearance EYES: Pupils equal, round, reactive to light and accommodation. No scleral icterus. Extraocular muscles intact.  HEENT: Head atraumatic, normocephalic. Oropharynx and nasopharynx clear.  NECK:  Supple, no jugular venous distention. No thyroid enlargement, no tenderness.  LUNGS: Normal breath sounds bilaterally, no wheezing, rales,rhonchi or crepitation. No use of accessory  muscles of respiration.  CARDIOVASCULAR: S1, S2 normal. No murmurs, rubs, or gallops.  ABDOMEN: Soft, nontender, nondistended. Bowel sounds present. No organomegaly or mass.  EXTREMITIES: No pedal edema, cyanosis, or clubbing.  Diffuse muscular atrophy NEUROLOGIC: Cranial nerves II through XII are intact. MAES. Gait not checked.  PSYCHIATRIC: The patient is alert and oriented x 2-3, intermittently confused and disoriented.  SKIN: No obvious rash, lesion, or ulcer.   LABORATORY PANEL:   CBC No results for input(s): WBC, HGB, HCT, PLT, MCV, MCH, MCHC, RDW, LYMPHSABS, MONOABS, EOSABS, BASOSABS, BANDABS in the last 168 hours.  Invalid input(s): NEUTRABS, BANDSABD ------------------------------------------------------------------------------------------------------------------  Chemistries  Recent Labs  Lab 03/19/19 1347  NA 136  K 5.4*  CL 105  CO2 25  GLUCOSE 117*  BUN 21  CREATININE 0.51  CALCIUM 7.8*  AST 25  ALT 16  ALKPHOS 57  BILITOT 0.7   ------------------------------------------------------------------------------------------------------------------  estimated creatinine clearance is 41.6 mL/min (by C-G formula based on SCr of 0.51 mg/dL). ------------------------------------------------------------------------------------------------------------------ No results for input(s): TSH, T4TOTAL, T3FREE, THYROIDAB in the last 72 hours.  Invalid input(s): FREET3   Coagulation profile No results for input(s): INR, PROTIME in the last 168 hours. ------------------------------------------------------------------------------------------------------------------- No results for input(s): DDIMER in the last 72 hours. -------------------------------------------------------------------------------------------------------------------  Cardiac Enzymes No results for input(s): CKMB, TROPONINI, MYOGLOBIN in the last 168 hours.  Invalid input(s):  CK ------------------------------------------------------------------------------------------------------------------ Invalid input(s): POCBNP  ---------------------------------------------------------------------------------------------------------------  Urinalysis    Component Value Date/Time   COLORURINE YELLOW (A) 02/10/2019 0940   APPEARANCEUR CLEAR (A) 02/10/2019 0940   APPEARANCEUR Clear 12/26/2013 2205   LABSPEC 1.014 02/10/2019 0940   LABSPEC 1.013 12/26/2013 2205   PHURINE 7.0 02/10/2019 0940   GLUCOSEU NEGATIVE 02/10/2019 0940   GLUCOSEU Negative 12/26/2013 2205   HGBUR NEGATIVE 02/10/2019 0940   BILIRUBINUR NEGATIVE 02/10/2019 0940   BILIRUBINUR Negative 12/26/2013 2205   KETONESUR 5 (A) 02/10/2019 0940   PROTEINUR NEGATIVE 02/10/2019 0940   NITRITE NEGATIVE 02/10/2019 0940   LEUKOCYTESUR TRACE (A) 02/10/2019 0940   LEUKOCYTESUR Negative 12/26/2013 2205     RADIOLOGY: Dg Chest 1 View  Result Date: 03/19/2019 CLINICAL DATA:  Recent fall with known hip fracture EXAM: CHEST  1 VIEW COMPARISON:  02/10/19 FINDINGS: Multiple old rib fractures are noted on the right. No pneumothorax is seen. No definitive acute rib fractures are noted. Cardiac shadow is at the upper limits of normal in size. No focal infiltrate or effusion is seen. IMPRESSION: Multiple old rib fractures on the right. No other focal abnormality is noted. Electronically Signed   By: Alcide CleverMark  Lukens M.D.   On: 03/19/2019 13:49   Dg Shoulder Right  Result Date: 03/19/2019 CLINICAL DATA:  Fall today with right shoulder pain, initial encounter EXAM: RIGHT SHOULDER - 2+ VIEW COMPARISON:  12/25/2013 FINDINGS: Multiple healed rib fractures are noted on the right. No acute fracture or dislocation is noted. No gross soft tissue abnormality is seen. IMPRESSION: Old rib fractures on the right. No acute abnormality about the shoulder. Electronically Signed   By: Alcide CleverMark  Lukens M.D.   On: 03/19/2019 13:39   Dg Wrist Complete  Right  Result Date: 03/19/2019 CLINICAL DATA:  Recent fall with wrist pain, initial encounter EXAM: RIGHT WRIST - COMPLETE 3+ VIEW COMPARISON:  None. FINDINGS: Degenerative changes are noted in the carpal bones as well as the radiocarpal joint. No acute fracture or dislocation is noted. No soft tissue abnormality is seen. IMPRESSION: Degenerative change without acute abnormality. Electronically Signed   By: Alcide CleverMark  Lukens M.D.   On: 03/19/2019 13:43   Dg Hip Unilat W Or Wo Pelvis 2-3 Views Right  Result Date: 03/19/2019 CLINICAL DATA:  Recent fall with right hip pain, initial encounter EXAM: DG HIP (WITH OR WITHOUT PELVIS) 2-3V RIGHT COMPARISON:  None. FINDINGS: Comminuted intratrochanteric fracture is noted in the proximal right femur. Impaction and angulation at the fracture site is noted. Previous pinning of the left hip is seen. Old healed pubic rami fractures on the right are noted. IMPRESSION: Comminuted intratrochanteric right femoral fracture. Electronically Signed   By: Alcide CleverMark  Lukens M.D.   On: 03/19/2019 13:48    EKG: Orders placed or performed during the hospital encounter of 03/19/19  . ED EKG  . ED EKG    IMPRESSION AND PLAN: *Acute right intratrochanteric femoral fracture status post mechanical fall Admit to regular nursing for bed, orthopedic surgery/Dr. Joice LoftsPoggi consulted for possible operative intervention on tomorrow,  n.p.o. after midnight, adult pain protocol  *Chronic hypothyroidism, unspecified Continue Synthroid  *Chronic dementia Stable Continue psychotropic management, increase nursing care PRN, fall/aspiration precautions while in house  *History of peptic ulcer disease PPI daily  *Acute hyperkalemia [place] p.o. x1, BMP in the morning  *Chronic adult failure to thrive/deconditioning Dietary consulted, all other plans as stated above  *Anemia, chronic Check anemia work-up and treat as indicated  DVT prophylaxis with SCDs PPI for GI prophylaxis Disposition  pending clinical course  All the records are reviewed and case discussed with ED provider. Management plans discussed with the patient, family and they are in agreement.  CODE STATUS:DNR Code Status History    Date Active Date Inactive Code Status Order ID Comments User Context   03/18/2017 1340 03/20/2017 2031 Full Code [id number]  Baxter Hire, MD Inpatient       TOTAL TIME TAKING CARE OF THIS PATIENT: 40 minutes.    Avel Peace Tamberly Pomplun M.D on 03/19/2019   Between 7am to 6pm - Pager - [phone]  After 6pm go to [email or web address] - password [email or web address] [facility]  Office  [phone]  CC: Primary care physician; Dion Body, MD   Note: This dictation was prepared with Dragon dictation along with smaller phrase technology. Any transcriptional errors that result from this process are unintentional.

## 2019-03-19 NOTE — ED Provider Notes (Signed)
-----------------------------------------   1:56 PM on 03/19/2019 -----------------------------------------  EKG viewed and interpreted by myself shows a sinus rhythm at 66 bpm with a slightly widened QRS, normal axis, slight PR prolongation consistent with first-degree AV block slight QTC prolongation of 519 ms, nonspecific but no concerning ST changes.   Harvest Dark, MD 03/19/19 1356

## 2019-03-19 NOTE — ED Provider Notes (Addendum)
[facility] EMERGENCY DEPARTMENT Provider Note   CSN: [id number] Arrival date & time: 03/19/19  1209    History   Chief Complaint Chief Complaint  Patient presents with  . Fall    HPI Suzanne Russo is a 85 y.o. female.     HPI Patient arrived via EMS secondary to a fall.  Patient was at a hair salon and missed the chair while trying to sit down.  Patient complained of pain to right shoulder, right wrist, and right hip.  Patient denies LOC.  Patient is drowsy secondary to taking 100 Michael grams of fentanyl given by EMS in route.  Patient denies pain at this time. Past Medical History:  Diagnosis Date  . Carpal tunnel syndrome   . Concussion with no loss of consciousness 08/26/2015  . Degenerative arthritis   . Dementia ([place]) 02/13/2019  . DVT (deep venous thrombosis) ([place])   . GERD (gastroesophageal reflux disease)   . Hemorrhoids   . Hypothyroidism   . Memory difficulty 07/16/2014  . Occipital neuralgia   . Occipital neuralgia of left side 12/15/2014  . Osteoporosis   . Pelvic fracture ([place])   . Peptic ulcer disease   . Postconcussive syndrome 12/20/2018  . Right clavicle fracture   . Trigeminal neuralgia   . Venous insufficiency     Patient Active Problem List   Diagnosis Date Noted  . Dementia ([place]) 02/13/2019  . Aggression 02/10/2019  . Postconcussive syndrome 12/20/2018  . Closed fracture of body of sternum 03/20/2017  . Anemia 03/20/2017  . Generalized weakness 03/20/2017  . Pleuritic chest pain 03/20/2017  . Hypothyroidism 03/20/2017  . Rib fractures 03/18/2017  . Chronic venous insufficiency 03/14/2017  . Ankle ulcer, left, limited to breakdown of skin ([place]) 01/03/2017  . Cellulitis of lower extremity 11/14/2016  . Swelling of limb 11/14/2016  . Lymphedema 11/14/2016  . Headache disorder 08/26/2015  . Concussion with no loss of consciousness 08/26/2015  . Occipital neuralgia of left side 12/15/2014  . Memory difficulty 07/16/2014   . Trigeminal neuralgia 07/11/2013    Past Surgical History:  Procedure Laterality Date  . bilateral cataract surgery    . bilateral knee surgery    . left hip fracture    . left thumb surgery    . left toe surgery    . NASAL SINUS SURGERY    . TONSILLECTOMY    . VAGINAL HYSTERECTOMY       OB History   No obstetric history on file.      Home Medications    Prior to Admission medications   Medication Sig Start Date End Date Taking? Authorizing Provider  acetaminophen (TYLENOL) 500 MG tablet Take 1,000 mg by mouth every 8 (eight) hours.     [provider]  Calcium Carbonate-Vit D-Min (CALTRATE 600+D PLUS MINERALS) 600-800 MG-UNIT TABS Take 1 tablet by mouth 2 (two) times daily.    [provider]  carbamazepine (CARBATROL) 200 MG 12 hr capsule Take 1 capsule (200 mg total) by mouth 2 (two) times daily. 03/04/19   Kathrynn Ducking, MD  carbamazepine (CARBATROL) 300 MG 12 hr capsule TAKE 1 CAPSULE BY MOUTH TWICE DAILY 03/17/19   Kathrynn Ducking, MD  celecoxib (CELEBREX) 200 MG capsule Take 200 mg by mouth daily. 08/19/15   [provider]  Cyanocobalamin (B-12 PO) Take 1,000 mcg by mouth daily.     [provider]  hydrocortisone (ANUSOL-HC) 25 MG suppository Place 25 mg rectally 2 (two) times  daily as needed for hemorrhoids or anal itching.     [provider]  levothyroxine (SYNTHROID, LEVOTHROID) 100 MCG tablet Take 100 mcg by mouth daily before breakfast.    [provider]  loratadine-pseudoephedrine (CLARITIN-D 24-HOUR) 10-240 MG 24 hr tablet Take 1 tablet by mouth daily.    [provider]  meclizine (ANTIVERT) 25 MG tablet Take 25 mg by mouth every 6 (six) hours as needed.    [provider]  OLANZapine (ZYPREXA) 2.5 MG tablet 1 in the morning, 2 in the evening 02/13/19   York SpanielWillis, Charles K, MD  OLANZapine zydis (ZYPREXA) 5 MG disintegrating tablet At bedtime  And may use 2.5 mg twice daily (1/2 tablet) as  needed for agitation during the day. 02/11/19   Mariel CraftMaurer, Sheila M, MD  omeprazole (PRILOSEC) 20 MG capsule Take 20 mg by mouth daily.    [provider]  triamcinolone cream (KENALOG) 0.1 % Apply 1 application topically 2 (two) times daily. Compounded with eucerin    [provider]  zolendronic acid (ZOMETA) 4 MG/5ML injection Inject 4 mg into the vein once. Last received inj 11/07/17    [provider]    Family History Family History  Problem Relation Age of Onset  . Heart disease Mother   . COPD Father   . Congestive Heart Failure Brother     Social History Social History   Tobacco Use  . Smoking status: Former Smoker    Packs/day: 1.00    Years: 45.00    Pack years: 45.00    Types: Cigarettes  . Smokeless tobacco: Never Used  Substance Use Topics  . Alcohol use: Yes    Alcohol/week: 7.0 standard drinks    Types: 7 Glasses of wine per week    Comment: wine on occasion  . Drug use: No     Allergies   Aricept [donepezil]; Citalopram; Sulfa antibiotics; and Tetracycline   Review of Systems Review of Systems Patient is currently no acute distress.  Patient denies headache.  Patient denies vision disorder.  Patient denies neck pain.  Patient denies shortness of breath.  Patient denies chest or abdominal pain.  Patient stated pain to right shoulder, right wrist and right hip.  Physical Exam Updated Vital Signs There were no vitals taken for this visit.  Physical Exam Patient appears no acute distress.  HEENT was unremarkable.  Neck was supple with no adenopathy.  Lungs are clear to auscultation heart is regular rate and rhythm.  Examination of the right upper extremity shows no obvious deformity.  There is angulation of the right hip.  ED Treatments / Results  Labs (all labs ordered are listed, but only abnormal results are displayed) Labs Reviewed - No data to display  EKG None  Radiology No results found. X-ray shows degenerative  changes in the shoulder and wrist.  X-ray of the right hip reveal comminuted intertrochanteric fracture of the proximal right femur. Procedures Procedures (including critical care time)  Medications Ordered in ED Medications - No data to display   Initial Impression / Assessment and Plan / ED Course  I have reviewed the triage vital signs and the nursing notes.  Pertinent labs & imaging results that were available during my care of the patient were reviewed by me and considered in my medical decision making (see chart for details).        Patient presents via EMS with complaint of right shoulder pain, right wrist pain, and right hip pain secondary to missing  a chair and falling.  Patient denies LOC.  Differential consist of fracture shoulder, fractured ribs, fractured hip, or myofascial pain.  Will obtain images from further evaluation. Final Clinical Impressions(s) / ED Diagnoses  Right hip fracture.  Patient will be admitted for definitive evaluation and treatment. Final diagnoses:  None   Right hip fracture. ED Discharge Orders    None       Joni ReiningSmith, Ronald K, PA-C 03/19/19 1400    Joni ReiningSmith, Ronald K, PA-C 03/19/19 1424    Dionne BucySiadecki, Sebastian, MD 03/19/19 [phone]

## 2019-03-19 NOTE — Plan of Care (Signed)

## 2019-03-19 NOTE — Progress Notes (Signed)
Dr. Roland Rack at the bedside advised that pt would be going to OR tonight.

## 2019-03-19 NOTE — Consult Note (Signed)
ORTHOPAEDIC CONSULTATION  REQUESTING PHYSICIAN: Salary, Evelena AsaMontell D, MD  Chief Complaint:   Right hip pain.  History of Present Illness: Suzanne Russo is a 85 y.o. female with a history of gastroesophageal reflux disease, peptic ulcer disease, early dementia, hypothyroidism, etc. who lives independently with her husband.  Apparently, while at the hair salon, she attempted to sit down, but lost her balance and missed the chair, landing on her right side.  She was brought to the emergency room where x-rays demonstrated a displaced intertrochanteric fracture of her right hip.  The patient also complained of right wrist and right shoulder pain, but x-rays in the emergency room of these areas were unremarkable for fracture.  The patient did not strike her head or lose consciousness.  She also denies any lightheadedness, dizziness, chest pain, shortness of breath, or other symptoms which may have precipitated her fall.  Past Medical History:  Diagnosis Date  . Carpal tunnel syndrome   . Concussion with no loss of consciousness 08/26/2015  . Degenerative arthritis   . Dementia (HCC) 02/13/2019  . DVT (deep venous thrombosis) (HCC)   . GERD (gastroesophageal reflux disease)   . Hemorrhoids   . Hypothyroidism   . Memory difficulty 07/16/2014  . Occipital neuralgia   . Occipital neuralgia of left side 12/15/2014  . Osteoporosis   . Pelvic fracture (HCC)   . Peptic ulcer disease   . Postconcussive syndrome 12/20/2018  . Right clavicle fracture   . Trigeminal neuralgia   . Venous insufficiency    Past Surgical History:  Procedure Laterality Date  . bilateral cataract surgery    . bilateral knee surgery    . left hip fracture    . left thumb surgery    . left toe surgery    . NASAL SINUS SURGERY    . TONSILLECTOMY    . VAGINAL HYSTERECTOMY     Social History   Socioeconomic History  . Marital status: Married    Spouse name: Not  on file  . Number of children: 3  . Years of education: college  . Highest education level: Not on file  Occupational History  . Occupation: Retired  Engineer, productionocial Needs  . Financial resource strain: Not on file  . Food insecurity:    Worry: Not on file    Inability: Not on file  . Transportation needs:    Medical: Not on file    Non-medical: Not on file  Tobacco Use  . Smoking status: Former Smoker    Packs/day: 1.00    Years: 45.00    Pack years: 45.00    Types: Cigarettes  . Smokeless tobacco: Never Used  Substance and Sexual Activity  . Alcohol use: Yes    Alcohol/week: 7.0 standard drinks    Types: 7 Glasses of wine per week    Comment: wine on occasion  . Drug use: No  . Sexual activity: Not on file  Lifestyle  . Physical activity:    Days per week: Not on file    Minutes per session: Not on file  . Stress: Not on file  Relationships  . Social connections:    Talks on phone: Not on file    Gets together: Not on file    Attends religious service: Not on file    Active member of club or organization: Not on file    Attends meetings of clubs or organizations: Not on file    Relationship status: Not on file  Other Topics Concern  .  Not on file  Social History Narrative   Patient is right handed.   Patient drinks 3 cups caffeine daily   Family History  Problem Relation Age of Onset  . Heart disease Mother   . COPD Father   . Congestive Heart Failure Brother    Allergies  Allergen Reactions  . Aricept [Donepezil]     GI upset  . Citalopram Other (See Comments)    Caused bad dreams  . Sulfa Antibiotics Rash  . Tetracycline Rash   Prior to Admission medications   Medication Sig Start Date End Date Taking? Authorizing Provider  acetaminophen (TYLENOL) 500 MG tablet Take 1,000 mg by mouth every 8 (eight) hours.    Yes [provider]  Calcium Carbonate-Vit D-Min (CALTRATE 600+D PLUS MINERALS) 600-800 MG-UNIT TABS Take 1 tablet by mouth 2 (two) times  daily.   Yes [provider]  carbamazepine (CARBATROL) 200 MG 12 hr capsule Take 1 capsule (200 mg total) by mouth 2 (two) times daily. 03/04/19  Yes York SpanielWillis, Charles K, MD  carbamazepine (CARBATROL) 300 MG 12 hr capsule TAKE 1 CAPSULE BY MOUTH TWICE DAILY 03/17/19  Yes York SpanielWillis, Charles K, MD  celecoxib (CELEBREX) 200 MG capsule Take 200 mg by mouth daily. 08/19/15  Yes [provider]  Cyanocobalamin (B-12 PO) Take 2,500 mcg by mouth daily.    Yes [provider]  hydrocortisone (ANUSOL-HC) 25 MG suppository Place 25 mg rectally 2 (two) times daily as needed for hemorrhoids or anal itching.    Yes [provider]  levothyroxine (SYNTHROID, LEVOTHROID) 100 MCG tablet Take 100 mcg by mouth daily before breakfast.   Yes [provider]  loratadine-pseudoephedrine (CLARITIN-D 24-HOUR) 10-240 MG 24 hr tablet Take 1 tablet by mouth daily.   Yes [provider]  meclizine (ANTIVERT) 25 MG tablet Take 25 mg by mouth every 6 (six) hours as needed.   Yes [provider]  OLANZapine (ZYPREXA) 2.5 MG tablet 1 in the morning, 2 in the evening Patient taking differently: Take 5 mg by mouth 2 (two) times a day.  02/13/19  Yes York SpanielWillis, Charles K, MD  omeprazole (PRILOSEC) 20 MG capsule Take 20 mg by mouth daily.   Yes [provider]  triamcinolone cream (KENALOG) 0.1 % Apply 1 application topically 2 (two) times daily.    Yes [provider]  zolendronic acid (ZOMETA) 4 MG/5ML injection Inject 4 mg into the vein once. Last received inj 11/07/17   Yes [provider]   Dg Chest 1 View  Result Date: 03/19/2019 CLINICAL DATA:  Recent fall with known hip fracture EXAM: CHEST  1 VIEW COMPARISON:  02/10/19 FINDINGS: Multiple old rib fractures are noted on the right. No pneumothorax is seen. No definitive acute rib fractures are noted. Cardiac shadow is at the upper limits of normal in size. No focal infiltrate or effusion is seen.  IMPRESSION: Multiple old rib fractures on the right. No other focal abnormality is noted. Electronically Signed   By: Alcide CleverMark  Lukens M.D.   On: 03/19/2019 13:49   Dg Shoulder Right  Result Date: 03/19/2019 CLINICAL DATA:  Fall today with right shoulder pain, initial encounter EXAM: RIGHT SHOULDER - 2+ VIEW COMPARISON:  12/25/2013 FINDINGS: Multiple healed rib fractures are noted on the right. No acute fracture or dislocation is noted. No gross soft tissue abnormality is seen. IMPRESSION: Old rib fractures on the right. No acute abnormality about the shoulder. Electronically Signed   By: Alcide CleverMark  Lukens M.D.   On:  03/19/2019 13:39   Dg Wrist Complete Right  Result Date: 03/19/2019 CLINICAL DATA:  Recent fall with wrist pain, initial encounter EXAM: RIGHT WRIST - COMPLETE 3+ VIEW COMPARISON:  None. FINDINGS: Degenerative changes are noted in the carpal bones as well as the radiocarpal joint. No acute fracture or dislocation is noted. No soft tissue abnormality is seen. IMPRESSION: Degenerative change without acute abnormality. Electronically Signed   By: Inez Catalina M.D.   On: 03/19/2019 13:43   Dg Hip Unilat W Or Wo Pelvis 2-3 Views Right  Result Date: 03/19/2019 CLINICAL DATA:  Recent fall with right hip pain, initial encounter EXAM: DG HIP (WITH OR WITHOUT PELVIS) 2-3V RIGHT COMPARISON:  None. FINDINGS: Comminuted intratrochanteric fracture is noted in the proximal right femur. Impaction and angulation at the fracture site is noted. Previous pinning of the left hip is seen. Old healed pubic rami fractures on the right are noted. IMPRESSION: Comminuted intratrochanteric right femoral fracture. Electronically Signed   By: Inez Catalina M.D.   On: 03/19/2019 13:48    Positive ROS: All other systems have been reviewed and were otherwise negative with the exception of those mentioned in the HPI and as above.  Physical Exam: General:  Alert, no acute distress Psychiatric:  Patient is competent for consent  with normal mood and affect   Cardiovascular:  No pedal edema Respiratory:  No wheezing, non-labored breathing GI:  Abdomen is soft and non-tender Skin:  No lesions in the area of chief complaint Neurologic:  Sensation intact distally Lymphatic:  No axillary or cervical lymphadenopathy  Orthopedic Exam:  Orthopedic examination is limited to the right hip and lower extremity.  The right lower extremity is somewhat shortened and externally rotated as compared to the left.  Skin inspection around the right hip is unremarkable.  No swelling, erythema, ecchymosis, abrasions, or other skin abnormalities are identified.  She has mild tenderness palpation over the lateral aspect of the right hip.  She has more severe pain with any attempted active or passive motion of the right hip.  She is able dorsiflex and plantarflex the right ankle and toes of her right foot.  Sensations intact light touch in all distributions.  She has good capillary refill to her right foot.  X-rays:  X-rays of the pelvis and right hip are available for review and have been reviewed by myself.  These films demonstrate a displaced intertrochanteric fracture of the right hip.  Mild degenerative changes of the right hip as manifest by mild symmetric joint space narrowing are identified.  No lytic lesions are noted.  She is status post a well fixed and healed intertrochanteric fracture of her left hip which was fixed with a compression screw and sideplate.  Assessment: Displaced intertrochanteric fracture right hip.  Plan: The treatment options have been discussed with the patient (and her husband by phone), including both surgical and nonsurgical choices.  The patient would like to proceed with surgical intervention to include an intramedullary nailing of the displaced right intertrochanteric hip fracture.  This procedure has been discussed in detail, as have the potential risks (including bleeding, infection, nerve and/or blood vessel  injury, persistent or recurrent pain, stiffness of the hip, malunion and/or nonunion, need for further surgery, blood clots, strokes, heart attacks and arrhythmias, etc.) and benefits.  The patient states her understanding and wishes to proceed.  A formal written consent will be obtained by the nursing staff.  Thank you for asking me to participate in the care of this most  pleasant woman.  I will be happy to follow her with you.   Maryagnes AmosJ. Jeffrey Poggi, MD  Beeper #:  [phone]  03/19/2019 6:10 PM

## 2019-03-20 ENCOUNTER — Other Ambulatory Visit: Payer: Self-pay

## 2019-03-20 ENCOUNTER — Encounter: Payer: Self-pay | Admitting: Surgery

## 2019-03-20 LAB — BASIC METABOLIC PANEL
Anion gap: 8 (ref 5–15)
BUN: 17 mg/dL (ref 8–23)
CO2: 26 mmol/L (ref 22–32)
Calcium: 8.1 mg/dL — ABNORMAL LOW (ref 8.9–10.3)
Chloride: 104 mmol/L (ref 98–111)
Creatinine, Ser: 0.5 mg/dL (ref 0.44–1.00)
GFR calc Af Amer: >60 mL/min (ref >60)
GFR calc non Af Amer: >60 mL/min (ref >60)
Glucose, Bld: 126 mg/dL — ABNORMAL HIGH (ref 70–99)
Potassium: 4 mmol/L (ref 3.5–5.1)
Sodium: 138 mmol/L (ref 135–145)

## 2019-03-20 LAB — CBC
HCT: 26.4 % — ABNORMAL LOW (ref 36.0–46.0)
Hemoglobin: 8.3 g/dL — ABNORMAL LOW (ref 12.0–15.0)
MCH: 29.2 pg (ref 26.0–34.0)
MCHC: 31.4 g/dL (ref 30.0–36.0)
MCV: 93 fL (ref 80.0–100.0)
Platelets: 259 10*3/uL (ref 150–400)
RBC: 2.84 MIL/uL — ABNORMAL LOW (ref 3.87–5.11)
RDW: 14.1 % (ref 11.5–15.5)
WBC: 7.8 10*3/uL (ref 4.0–10.5)
nRBC: 0 % (ref 0.0–0.2)

## 2019-03-20 LAB — VITAMIN B12: Vitamin B-12: 2843 pg/mL — ABNORMAL HIGH (ref 180–914)

## 2019-03-20 MED ORDER — OLANZAPINE 5 MG PO TABS
5.0000 mg | ORAL_TABLET | Freq: Two times a day (BID) | ORAL | Status: DC
  Administered 2019-03-20 – 2019-03-22 (×4): 5 mg via ORAL
  Filled 2019-03-20 (×3): qty 1

## 2019-03-20 MED ORDER — ENSURE ENLIVE PO LIQD: 237.0000 mL | Freq: Two times a day (BID) | ORAL | Status: DC

## 2019-03-20 MED ORDER — VITAMIN B-12 1000 MCG PO TABS: 1000.0000 ug | ORAL_TABLET | Freq: Every day | ORAL | Status: DC

## 2019-03-20 NOTE — Evaluation (Addendum)
Physical Therapy Evaluation Patient Details Name: Suzanne Russo MRN: [id number] DOB: 1931-11-26 Today's Date: 03/20/2019   History of Present Illness  Suzanne Russo is an 87yoF who fell at the nail salon with subsequent Rt hip pain, found to have Rt trochanteric hip fracture, now s/p IM nailing and WBAT. PTA pt primarily tolerated household to limited-community distance AMB. Pt sustained a fall in February 2020 sustaining a skull fracture.  Clinical Impression  Pt admitted with above diagnosis. Pt currently with functional limitations due to the deficits listed below (see "PT Problem List"). Upon entry, pt in bed, awake and agreeable to participate. The pt is alert and oriented x3, pleasant, conversational, and generally a fair historian. Pt has adequate pain control, but intermittent arresting pain in right thigh during bed level exercises, presentation consistent with muscle cramps, but patient unable to confirm. Functional mobility assessment demonstrates increased effort/time requirements, poor tolerance, and need for physical assistance, whereas the patient performed these at a higher level of independence PTA. Pt will benefit from skilled PT intervention to increase independence and safety with basic mobility in preparation for discharge to the venue listed below.       Follow Up Recommendations SNF;Supervision/Assistance - 24 hour;Supervision for mobility/OOB    Equipment Recommendations  None recommended by PT    Recommendations for Other Services       Precautions / Restrictions Precautions Precautions: Fall Restrictions RLE Weight Bearing: Weight bearing as tolerated      Mobility  Bed Mobility Overal bed mobility: (deferred at this time, pt not tolertin gmore than 25 degrees Right hip ROM without severe, arresting pain increases.)                Transfers                    Ambulation/Gait                Stairs            Wheelchair Mobility     Modified Rankin (Stroke Patients Only)       Balance Overall balance assessment: History of Falls                                           Pertinent Vitals/Pain Pain Assessment: Faces Faces Pain Scale: Hurts whole lot Pain Location: Right thigh Pain Descriptors / Indicators: Sharp;Operative site guarding;Cramping Pain Intervention(s): Limited activity within patient's tolerance;Monitored during session;Premedicated before session    Home Living Family/patient expects to be discharged to:: Private residence Living Arrangements: Spouse/significant other Available Help at Discharge: Personal care attendant(pt reports caregivers in the home day/night) Type of Home: House Home Access: Stairs to enter Entrance Stairs-Rails: (pt unable to say) Entrance Stairs-Number of Steps: 3 Home Layout: One level;Laundry or work area in [employer]ement Home Equipment: Environmental consultantWalker - 4 wheels;Walker - 2 wheels;Bedside commode;Hand held shower head;Shower seat      Prior Function Level of Independence: Needs assistance   Gait / Transfers Assistance Needed: [phone] use for household AMB  ADL's / Homemaking Assistance Needed: caregivers assist with dressing, bathing        Hand Dominance   Dominant Hand: Right    Extremity/Trunk Assessment                Communication   Communication: No difficulties  Cognition Arousal/Alertness: Awake/alert Behavior During Therapy: WFL for tasks assessed/performed  Overall Cognitive Status: History of cognitive impairments - at baseline                                 General Comments: some mild memory impairment      General Comments      Exercises Total Joint Exercises Short Arc Quad: Left;15 reps;Supine Heel Slides: 15 reps;10 reps;Right;Left;AROM;AAROM;Supine;Limitations Heel Slides Limitations: pain severely limiting tolerated ROM Hip ABduction/ADduction: AROM;AAROM;Right;Left;10 reps;15  reps;Supine;Limitations Hip Abduction/Adduction Limitations: pain severely limiting tolerated ROM   Assessment/Plan    PT Assessment Patient needs continued PT services  PT Problem List Decreased strength;Decreased range of motion;Decreased activity tolerance;Decreased mobility;Decreased cognition       PT Treatment Interventions DME instruction;Therapeutic exercise;Gait training;Stair training;Functional mobility training;Therapeutic activities;Cognitive remediation;Patient/family education    PT Goals (Current goals can be found in the Care Plan section)  Acute Rehab PT Goals Patient Stated Goal: reduce pain in Right leg PT Goal Formulation: With patient Time For Goal Achievement: 04/03/19 Potential to Achieve Goals: Fair    Frequency BID   Barriers to discharge Decreased caregiver support;Inaccessible home environment      Co-evaluation               AM-PAC PT "6 Clicks" Mobility  Outcome Measure Help needed turning from your back to your side while in a flat bed without using bedrails?: Total Help needed moving from lying on your back to sitting on the side of a flat bed without using bedrails?: Total Help needed moving to and from a bed to a chair (including a wheelchair)?: Total Help needed standing up from a chair using your arms (e.g., wheelchair or bedside chair)?: Total Help needed to walk in hospital room?: Total Help needed climbing 3-5 steps with a railing? : Total 6 Click Score: 6    End of Session Equipment Utilized During Treatment: Oxygen Activity Tolerance: Patient tolerated treatment well;Patient limited by pain Patient left: in bed;with call bell/phone within reach;with bed alarm set Nurse Communication: Mobility status PT Visit Diagnosis: Other abnormalities of gait and mobility (R26.89);Muscle weakness (generalized) (M62.81);Difficulty in walking, not elsewhere classified (R26.2)    Time: 2353-6144 PT Time Calculation (min) (ACUTE ONLY): 29  min   Charges:   PT Evaluation $PT Eval Low Complexity: 1 Low PT Treatments $Therapeutic Exercise: 8-22 mins       1:05 PM, 03/20/19 Etta Grandchild, PT, DPT Physical Therapist - [facility]  [phone] ([place])   [place]ver C 03/20/2019, 1:02 PM

## 2019-03-20 NOTE — TOC Progression Note (Signed)
Transition of Care [facility]) - Progression Note    Patient Details  Name: SHIRLE PROVENCAL MRN: [id number] Date of Birth: 1932/10/02  [facility] CM/SW Contact  Su Hilt, RN Phone Number: 03/20/2019, 2:53 PM  Clinical Narrative:    Spoke with the patient's husband and he was animate that the patient not go to rehab, he asked that I call his daughter Kary Kos contacted [name] the daughter at [phone], She would like to get a hospital bed for the patient to help with her pain at home and for positioning.  She also would like to stay with [facility] however use a different PT other than Horris Latino, I called [place] at [employer] and requested that they do not send Horris Latino  She agreed that they would send someone else.  They patient will need PT, OT, SW and Aide.  She does have 24 hour care giver with CNAs they stay in the room with the patient even at night.  I notified Brad with [employer] for the Hospital bed, all other DME is in the home already.Patient will need EMS transport to home at DC   Expected Discharge Plan: [place] Barriers to Discharge: Continued Medical Work up  Expected Discharge Plan and Services Expected Discharge Plan: [place]   Discharge Planning Services: CM Consult Post Acute Care Choice: [place] arrangements for the past 2 months: Single Family Home                 DME Arranged: N/A                     Social Determinants of Health (SDOH) Interventions    Readmission Risk Interventions No flowsheet data found.

## 2019-03-20 NOTE — Plan of Care (Signed)

## 2019-03-20 NOTE — Progress Notes (Addendum)
Physical Therapy Treatment Patient Details Name: Suzanne Russo MRN: [id number] DOB: 03-Oct-1932 Today's Date: 03/20/2019    History of Present Illness Suzanne Russo is an 87yoF who fell at the nail salon with subsequent Rt hip pain, found to have Rt trochanteric hip fracture, now s/p IM nailing and WBAT. PTA pt primarily tolerated household to limited-community distance AMB. Pt sustained a fall in February 2020 sustaining a skull fracture.    PT Comments    Author returning for 2nd session this date, pt appears mildly more drowsy but remains consistently alert during session. Pt is on phone with husband upon entry, asking author to call husband after session, then pt assisted with turning phone off. Pt assisted with bed level exercises, this time much more well tolerated, but she still requires physical assist 2/2 weakness.Total assist to EOB, where eventually patient is able to establish balance and sit independently for >5 minutes. STS transfers performed 2x, able to remain up for ~30sec each, but pt grows shaky and weak fairly quickly and is allowed to return to sitting EOB. RN called in as Left wrist IV access is wet with a mixture of mostly saline tinged with bright red blood. Pt progressing well in general. Author called pt's husband p session to answer questions.   Follow Up Recommendations  Supervision/Assistance - 24 hour;Supervision for mobility/OOB;Home health PT     Equipment Recommendations  Other (comment)(will need a hospital bed; already has all other equipment)    Recommendations for Other Services       Precautions / Restrictions Precautions Precautions: Fall Restrictions Weight Bearing Restrictions: Yes RUE Weight Bearing: Weight bearing as tolerated LUE Weight Bearing: Weight bearing as tolerated RLE Weight Bearing: Weight bearing as tolerated LLE Weight Bearing: Weight bearing as tolerated    Mobility  Bed Mobility Overal bed mobility: Needs Assistance Bed  Mobility: Supine to Sit;Sit to Supine     Supine to sit: Total assist Sit to supine: Total assist      Transfers Overall transfer level: Needs assistance Equipment used: Rolling walker (2 wheeled) Transfers: Sit to/from Stand Sit to Stand: Mod assist         General transfer comment: Assisted with RW 2x, pt nervous but motivated and follows cues well. Good use of UE. COM remains far posterior to BOS despite cues to correct. Pt able to move feet slighty in stane to reposition. Pt gets shaky after 30sec standing.  Ambulation/Gait                 Stairs             Wheelchair Mobility    Modified Rankin (Stroke Patients Only)       Balance Overall balance assessment: History of Falls;Needs assistance     Sitting balance - Comments: requires several minutes of adjusting to sitting tall mostly leanign bakc on arms which fatigue quickly.     Standing balance-Leahy Scale: Zero Standing balance comment: requires constant minA trunk support while standing;                            Cognition Arousal/Alertness: Awake/alert Behavior During Therapy: WFL for tasks assessed/performed Overall Cognitive Status: History of cognitive impairments - at baseline                                 General Comments: some mild memory impairment, generally calm  and cooperative althougth slightly confused and acting occasionally inappropriate to situation      Exercises Total Joint Exercises Short Arc Quad: 15 reps;Supine;Right(tolerated shockingly well after this morning's presentation) Heel Slides: Right;AAROM;Supine;5 reps Heel Slides Limitations: ROM improved compared to this morning, 2x ROM Hip ABduction/ADduction: AAROM;Right;Supine;Limitations Hip Abduction/Adduction Limitations: no limiting cramps    General Comments        Pertinent Vitals/Pain Pain Assessment: Faces Faces Pain Scale: Hurts a little bit(just a few instances of mild  grimace and guarding during exercises.) Pain Intervention(s): Limited activity within patient's tolerance;Monitored during session;Premedicated before session;Repositioned    Home Living                      Prior Function            PT Goals (current goals can now be found in the care plan section) Acute Rehab PT Goals Patient Stated Goal: reduce pain in Right leg PT Goal Formulation: With patient Time For Goal Achievement: 04/03/19 Progress towards PT goals: Progressing toward goals    Frequency    BID      PT Plan Other (comment);Discharge plan needs to be updated(Husband strongly leaning toward HHPT, which could work)    Co-evaluation              AM-PAC PT "6 Clicks" Mobility   Outcome Measure  Help needed turning from your back to your side while in a flat bed without using bedrails?: Total Help needed moving from lying on your back to sitting on the side of a flat bed without using bedrails?: Total Help needed moving to and from a bed to a chair (including a wheelchair)?: A Lot Help needed standing up from a chair using your arms (e.g., wheelchair or bedside chair)?: A Lot Help needed to walk in hospital room?: Total Help needed climbing 3-5 steps with a railing? : Total 6 Click Score: 8    End of Session   Activity Tolerance: Patient tolerated treatment well;Patient limited by pain Patient left: in bed;with call bell/phone within reach;with nursing/sitter in room Nurse Communication: Mobility status PT Visit Diagnosis: Other abnormalities of gait and mobility (R26.89);Muscle weakness (generalized) (M62.81);Difficulty in walking, not elsewhere classified (R26.2)     Time: 1610-9604 PT Time Calculation (min) (ACUTE ONLY): 28 min  Charges:  $Therapeutic Exercise: 23-37 mins                     5:17 PM, 03/20/19 Etta Grandchild, PT, DPT Physical Therapist - [facility]  [phone] ([place])     Ned Kakar C 03/20/2019, 5:14 PM

## 2019-03-20 NOTE — [place] FL2 (Signed)
[place] MEDICAID FL2 LEVEL OF CARE SCREENING TOOL     IDENTIFICATION  Patient Name: Suzanne Russo T Jelley Birthdate: 08-08-1932 Sex: female Admission Date (Current Location): 03/19/2019  [place]   Facility and Address:  [facility], [address], [place], [place] [address]      Provider Number: [id number]  Attending Physician Name and Address:  Enedina FinnerPatel, Sona, MD  Relative Name and Phone Number:  Bobetta LimeJack Kirkendoll Husband [phone]    Current Level of Care: Hospital Recommended Level of Care: Skilled Nursing Facility Prior Approval Number:    Date Approved/Denied: 04/27/10 PASRR Number: [id number]  Discharge Plan: SNF    Current Diagnoses: Patient Active Problem List   Diagnosis Date Noted  . Hip fx (HCC) 03/19/2019  . Dementia (HCC) 02/13/2019  . Aggression 02/10/2019  . Postconcussive syndrome 12/20/2018  . Closed fracture of body of sternum 03/20/2017  . Anemia 03/20/2017  . Generalized weakness 03/20/2017  . Pleuritic chest pain 03/20/2017  . Hypothyroidism 03/20/2017  . Rib fractures 03/18/2017  . Chronic venous insufficiency 03/14/2017  . Ankle ulcer, left, limited to breakdown of skin (HCC) 01/03/2017  . Cellulitis of lower extremity 11/14/2016  . Swelling of limb 11/14/2016  . Lymphedema 11/14/2016  . Headache disorder 08/26/2015  . Concussion with no loss of consciousness 08/26/2015  . Occipital neuralgia of left side 12/15/2014  . Memory difficulty 07/16/2014  . Trigeminal neuralgia 07/11/2013    Orientation RESPIRATION BLADDER Height & Weight     Self, Place, Situation  Normal Continent Weight: 51.3 kg Height:  5\' 4"  (162.6 cm)  BEHAVIORAL SYMPTOMS/MOOD NEUROLOGICAL BOWEL NUTRITION STATUS      Continent Diet(Carb Modified)  AMBULATORY STATUS COMMUNICATION OF NEEDS Skin   Extensive Assist Verbally Normal, Surgical wounds                       Personal Care Assistance Level of Assistance   Dressing, Bathing Bathing Assistance: Limited assistance   Dressing Assistance: Limited assistance     Functional Limitations Info  Sight, Hearing, Speech Sight Info: Adequate Hearing Info: Adequate Speech Info: Adequate    SPECIAL CARE FACTORS FREQUENCY  PT (By licensed PT)     PT Frequency: 5 times per week              Contractures Contractures Info: Not present    Additional Factors Info  Code Status, Allergies Code Status Info: DNR Allergies Info: Aricept , Citalopram, Sulfa Antibiotics, Tetracycline           Current Medications (03/20/2019):  This is the current hospital active medication list Current Facility-Administered Medications  Medication Dose Route Frequency Provider Last Rate Last Dose  . acetaminophen (TYLENOL) tablet 325-650 mg  325-650 mg Oral Q6H PRN Poggi, Excell SeltzerJohn J, MD      . acetaminophen (TYLENOL) tablet 500 mg  500 mg Oral Q6H Poggi, Excell SeltzerJohn J, MD   500 mg at 03/20/19 [id number]  . albuterol (PROVENTIL) (2.5 MG/3ML) 0.083% nebulizer solution 2.5 mg  2.5 mg Nebulization Q6H PRN Poggi, Excell SeltzerJohn J, MD      . bisacodyl (DULCOLAX) EC tablet 5 mg  5 mg Oral Daily PRN Poggi, Excell SeltzerJohn J, MD      . bisacodyl (DULCOLAX) suppository 10 mg  10 mg Rectal Daily PRN Poggi, Excell SeltzerJohn J, MD      . calcium-vitamin D (OSCAL WITH D) 500-200 MG-UNIT per tablet 1 tablet  1 tablet Oral BID Poggi, Excell SeltzerJohn J, MD      .  carbamazepine (TEGRETOL XR) 12 hr tablet 200 mg  200 mg Oral BID Poggi, Excell SeltzerJohn J, MD      . carbamazepine (TEGRETOL XR) 12 hr tablet 300 mg  300 mg Oral BID Poggi, Excell SeltzerJohn J, MD      . ceFAZolin (ANCEF) IVPB 2g/100 mL premix  2 g Intravenous Q6H Poggi, Excell SeltzerJohn J, MD 200 mL/hr at 03/20/19 0328 2 g at 03/20/19 0328  . dextrose 5 %-0.45 % sodium chloride infusion   Intravenous Continuous Poggi, Excell SeltzerJohn J, MD   Stopped at 03/20/19 0328  . diphenhydrAMINE (BENADRYL) 12.5 MG/5ML elixir 12.5-25 mg  12.5-25 mg Oral Q4H PRN Poggi, Excell SeltzerJohn J, MD      . docusate sodium (COLACE) capsule 100 mg  100 mg Oral  BID Poggi, Excell SeltzerJohn J, MD   100 mg at 03/20/19 0018  . enoxaparin (LOVENOX) injection 40 mg  40 mg Subcutaneous Q24H Poggi, Excell SeltzerJohn J, MD      . HYDROcodone-acetaminophen (NORCO/VICODIN) 5-325 MG per tablet 1-2 tablet  1-2 tablet Oral Q4H PRN Poggi, Excell SeltzerJohn J, MD      . hydrocortisone (ANUSOL-HC) suppository 25 mg  25 mg Rectal BID PRN Poggi, Excell SeltzerJohn J, MD      . levothyroxine (SYNTHROID) tablet 100 mcg  100 mcg Oral [id number] Christena FlakePoggi, John J, MD   100 mcg at 03/20/19 0622  . loratadine (CLARITIN) tablet 10 mg  10 mg Oral Daily Poggi, Excell SeltzerJohn J, MD      . magnesium hydroxide (MILK OF MAGNESIA) suspension 30 mL  30 mL Oral Daily PRN Poggi, Excell SeltzerJohn J, MD      . meclizine (ANTIVERT) tablet 25 mg  25 mg Oral Q6H PRN Poggi, Excell SeltzerJohn J, MD      . metoCLOPramide (REGLAN) tablet 5-10 mg  5-10 mg Oral Q8H PRN Poggi, Excell SeltzerJohn J, MD       Or  . metoCLOPramide (REGLAN) injection 5-10 mg  5-10 mg Intravenous Q8H PRN Poggi, Excell SeltzerJohn J, MD      . morphine 4 MG/ML injection 0.52-1 mg  0.52-1 mg Intravenous Q2H PRN Poggi, Excell SeltzerJohn J, MD      . multivitamin with minerals tablet 1 tablet  1 tablet Oral Daily Poggi, Excell SeltzerJohn J, MD      . OLANZapine [facility](ZYPREXA) tablet 2.5 mg  2.5 mg Oral BID Poggi, Excell SeltzerJohn J, MD   Stopped at 03/20/19 0014  . OLANZapine zydis (ZYPREXA) disintegrating tablet 5 mg  5 mg Oral QHS Poggi, Excell SeltzerJohn J, MD      . ondansetron (ZOFRAN) tablet 4 mg  4 mg Oral Q6H PRN Poggi, Excell SeltzerJohn J, MD       Or  . ondansetron (ZOFRAN) injection 4 mg  4 mg Intravenous Q6H PRN Poggi, Excell SeltzerJohn J, MD      . pantoprazole (PROTONIX) EC tablet 40 mg  40 mg Oral Daily Poggi, Excell SeltzerJohn J, MD      . pseudoephedrine (SUDAFED) 12 hr tablet 120 mg  120 mg Oral BID Poggi, Excell SeltzerJohn J, MD      . senna-docusate (Senokot-S) tablet 1 tablet  1 tablet Oral QHS PRN Poggi, Excell SeltzerJohn J, MD      . sodium phosphate (FLEET) 7-19 GM/118ML enema 1 enema  1 enema Rectal Once PRN Poggi, Excell SeltzerJohn J, MD      . traMADol Janean Sark(ULTRAM) tablet 50 mg  50 mg Oral Q6H PRN Poggi, Excell SeltzerJohn J, MD   50 mg at 03/20/19 0010  .  triamcinolone cream (KENALOG) 0.1 % 1 application  1 application Topical BID Poggi,  Marshall Cork, MD      . vitamin B-12 (CYANOCOBALAMIN) tablet 1,000 mcg  1,000 mcg Oral Daily Poggi, Marshall Cork, MD      . Derrill Memo ON 05/08/2019] zolendronic acid (ZOMETA) injection 4 mg  4 mg Intravenous Once Poggi, Marshall Cork, MD         Discharge Medications: Please see discharge summary for a list of discharge medications.  Relevant Imaging Results:  Relevant Lab Results:   Additional Information [id number]  Su Hilt, RN

## 2019-03-20 NOTE — Progress Notes (Signed)
  Subjective: 1 Day Post-Op Procedure(s) (LRB): INTRAMEDULLARY (IM) NAIL INTERTROCHANTRIC (Right) Patient reports pain as mild.   Patient is well but combative with me this morning. PT and care management to assist with discharge planning. Negative for chest pain and shortness of breath Fever: no Gastrointestinal:Negative for nausea and vomiting  Objective: Vital signs in last 24 hours: Temp:  [97.3 F (36.3 C)-98.3 F (36.8 C)] 98.3 F (36.8 C) (06/11 0609) Pulse Rate:  [58-68] 63 (06/11 0609) Resp:  [11-20] 19 (06/11 0609) BP: (113-165)/(60-82) 113/67 (06/11 0609) SpO2:  [99 %-100 %] 99 % (06/11 0609) Weight:  [51.3 kg] 51.3 kg (06/10 1224)  Intake/Output from previous day:  Intake/Output Summary (Last 24 hours) at 03/20/2019 0732 Last data filed at 03/20/2019 0328 Gross per 24 hour  Intake 404.52 ml  Output 640 ml  Net -235.48 ml    Intake/Output this shift: No intake/output data recorded.  Labs: Recent Labs    03/19/19 1347 03/20/19 0259  HGB 9.8* 8.3*   Recent Labs    03/19/19 1347 03/20/19 0259  WBC 5.9 7.8  RBC 3.25* 2.84*  HCT 30.4* 26.4*  PLT 200 259   Recent Labs    03/19/19 1347 03/20/19 0259  NA 136 138  K 5.4* 4.0  CL 105 104  CO2 25 26  BUN 21 17  CREATININE 0.51 0.50  GLUCOSE 117* 126*  CALCIUM 7.8* 8.1*   No results for input(s): LABPT, INR in the last 72 hours.   EXAM General - Patient is Alert, Disorganized and Confused.  Upon me inspecting her right leg wounds the patient demanded that I leave the room. Extremity - ABD soft Sensation intact distally Intact pulses distally Dorsiflexion/Plantar flexion intact Incision: dressing C/D/I Dressing/Incision - clean, dry, no drainage Motor Function - intact, moving foot and toes well on exam.  Past Medical History:  Diagnosis Date  . Carpal tunnel syndrome   . Concussion with no loss of consciousness 08/26/2015  . Degenerative arthritis   . Dementia ([place]) 02/13/2019  . DVT (deep  venous thrombosis) ([place])   . GERD (gastroesophageal reflux disease)   . Hemorrhoids   . Hypothyroidism   . Memory difficulty 07/16/2014  . Occipital neuralgia   . Occipital neuralgia of left side 12/15/2014  . Osteoporosis   . Pelvic fracture ([place])   . Peptic ulcer disease   . Postconcussive syndrome 12/20/2018  . Right clavicle fracture   . Trigeminal neuralgia   . Venous insufficiency     Assessment/Plan: 1 Day Post-Op Procedure(s) (LRB): INTRAMEDULLARY (IM) NAIL INTERTROCHANTRIC (Right) Active Problems:   Hip fx (HCC)  Estimated body mass index is 19.41 kg/m as calculated from the following:   Height as of this encounter: 5\' 4"  (1.626 m).   Weight as of this encounter: 51.3 kg. Advance diet Up with therapy D/C IV fluids when tolerating po diet.  Labs reviewed this AM. Hg 8.3 this morning. Up with therapy today. Patient verbally abusive this AM, monitor pain meds and signs of delirium. Begin working on BM.  DVT Prophylaxis - Lovenox, Foot Pumps and TED hose Weight-Bearing as tolerated to right leg  J. Cameron Proud, PA-C [facility] Orthopaedic Surgery 03/20/2019, 7:32 AM

## 2019-03-20 NOTE — Progress Notes (Signed)
Pt woke up at 3am disoriented and verbally abusive towards staff. Pt was threatening to spit into staff's face and posturing towards staff aggressively. Pt was attempting to get out of bed stating that she had to leave because she had to check on her daughter Judeen Hammans. It took approximately 10 minutes to reorient pt to reality. Pt had attempted to remove one of her surgical dressings unsuccessfully. Dressing was reinforced. No further negative issues. Pt was calm and cooperative with staff the rest of the shift. Pt was complaint with urethral catheter removal.

## 2019-03-20 NOTE — Progress Notes (Signed)
Pt refused all vitals this morning. Attempted to speak to her and reason with her and reorient her but she refused to cooperate. Refused meds, refused assessment, refused food. Called husband to see if he can help but getting voice mail will attempt again later

## 2019-03-20 NOTE — Progress Notes (Signed)
Initial Nutrition Assessment  RD working remotely.  DOCUMENTATION CODES:   Not applicable  INTERVENTION:  Recommend liberalizing diet to regular. Provide assistance with ordering so patient can choose foods she enjoys.  Provide Ensure Enlive po BID, each supplement provides 350 kcal and 20 grams of protein. Patient prefers chocolate.  Continue daily MVI.  NUTRITION DIAGNOSIS:   Inadequate oral intake related to decreased appetite as evidenced by meal completion < 25%.  GOAL:   Patient will meet greater than or equal to 90% of their needs  MONITOR:   PO intake, Supplement acceptance, Labs, Weight trends, Skin, I & O's  REASON FOR ASSESSMENT:   Consult Assessment of nutrition requirement/status  ASSESSMENT:   85 year old female with PMHx of DVT, hypothyroidism, OP, GERD, peptic ulcer disease, dementia admitted with closed displaced three-part intertrochanteric fracture of right hip s/p reduction and internal fixation on 6/10.   Patient confused and unable to provide any history at this time. Per chart she ate 0% of her dinner last night and only 5% of breakfast this morning. Spoke with patient's husband over the phone. He reports she has been on appetite stimulants lately after her weight loss and they had been working. She had been eating more at home and gaining some weight back. She is not on a restricted diet at home. She enjoys fish, potatoes, greens, and other vegetables. She will also eat chicken but he reports she does not like beef as much. She drinks chocolate Boost at home.   Patient's UBW had been 135-140 lbs. Husband reports she lost weight over time to around 105 lbs. She has been gaining weight since. She is now 51.3 kg (113.1 lbs).  Medications reviewed and include: Oscal with D 1 tablet BID, Colace 100 mg BID, levothyroxine, MVI daily, pantoprazole, vitamin B12 1000 micrograms daily, cefazolin.  Labs reviewed: vitamin B12 2843.  Patient is at risk for  malnutrition.  NUTRITION - FOCUSED PHYSICAL EXAM:  Unable to complete at this time.  Diet Order:   Diet Order            Diet heart healthy/carb modified Room service appropriate? Yes; Fluid consistency: Thin  Diet effective now             EDUCATION NEEDS:   No education needs have been identified at this time  Skin:  Skin Assessment: Skin Integrity Issues:(closed incision right hip)  Last BM:  03/19/2019 per chart  Height:   Ht Readings from Last 1 Encounters:  03/19/19 5\' 4"  (1.626 m)   Weight:   Wt Readings from Last 1 Encounters:  03/19/19 51.3 kg   Ideal Body Weight:  54.5 kg  BMI:  Body mass index is 19.41 kg/m.  Estimated Nutritional Needs:   Kcal:  1400-1600  Protein:  65-75 grams  Fluid:  1.4-1.6 L/day  Willey Blade, MS, RD, LDN Office: [phone] Pager: [phone] After Hours/Weekend Pager: [phone]

## 2019-03-20 NOTE — Progress Notes (Signed)
[facility] [place] [facility]   PATIENT NAME: Suzanne Russo    MR#:  [id number]  DATE OF BIRTH:  04-13-1932  SUBJECTIVE:  patient came in after a fall mechanical. Status post hip surgery. No issues per RN. Has dementia.   REVIEW OF SYSTEMS:   Review of Systems  Unable to perform ROS: Dementia   Tolerating Diet:yes Tolerating PT: [pending  DRUG ALLERGIES:   Allergies  Allergen Reactions  . Aricept [Donepezil]     GI upset  . Citalopram Other (See Comments)    Caused bad dreams  . Sulfa Antibiotics Rash  . Tetracycline Rash    VITALS:  Blood pressure 113/67, pulse 63, temperature 98.3 F (36.8 C), temperature source Oral, resp. rate 19, height 5\' 4"  (1.626 m), weight 51.3 kg, SpO2 99 %.  PHYSICAL EXAMINATION:   Physical Exam  GENERAL:  85 y.o.-year-old patient lying in the bed with no acute distress.  EYES: Pupils equal, round, reactive to light and accommodation. No scleral icterus. Extraocular muscles intact.  HEENT: Head atraumatic, normocephalic. Oropharynx and nasopharynx clear.  NECK:  Supple, no jugular venous distention. No thyroid enlargement, no tenderness.  LUNGS: Normal breath sounds bilaterally, no wheezing, rales, rhonchi. No use of accessory muscles of respiration.  CARDIOVASCULAR: S1, S2 normal. No murmurs, rubs, or gallops.  ABDOMEN: Soft, nontender, nondistended. Bowel sounds present. No organomegaly or mass.  EXTREMITIES: No cyanosis, clubbing or edema b/l.   Surgical dressing + NEUROLOGIC; grossly nonfocal. Moves all extremities well PSYCHIATRIC:  patient is alert and confused at baseline due to her dementia SKIN: No obvious rash, lesion, or ulcer.   LABORATORY PANEL:  CBC Recent Labs  Lab 03/20/19 0259  WBC 7.8  HGB 8.3*  HCT 26.4*  PLT 259    Chemistries  Recent Labs  Lab 03/19/19 1347 03/20/19 0259  NA 136 138  K 5.4* 4.0  CL 105 104  CO2 25 26  GLUCOSE 117* 126*  BUN 21 17  CREATININE 0.51 0.50   CALCIUM 7.8* 8.1*  AST 25  --   ALT 16  --   ALKPHOS 57  --   BILITOT 0.7  --    Cardiac Enzymes No results for input(s): TROPONINI in the last 168 hours. RADIOLOGY:  Dg Chest 1 View  Result Date: 03/19/2019 CLINICAL DATA:  Recent fall with known hip fracture EXAM: CHEST  1 VIEW COMPARISON:  02/10/19 FINDINGS: Multiple old rib fractures are noted on the right. No pneumothorax is seen. No definitive acute rib fractures are noted. Cardiac shadow is at the upper limits of normal in size. No focal infiltrate or effusion is seen. IMPRESSION: Multiple old rib fractures on the right. No other focal abnormality is noted. Electronically Signed   By: Alcide CleverMark  Lukens M.D.   On: 03/19/2019 13:49   Dg Shoulder Right  Result Date: 03/19/2019 CLINICAL DATA:  Fall today with right shoulder pain, initial encounter EXAM: RIGHT SHOULDER - 2+ VIEW COMPARISON:  12/25/2013 FINDINGS: Multiple healed rib fractures are noted on the right. No acute fracture or dislocation is noted. No gross soft tissue abnormality is seen. IMPRESSION: Old rib fractures on the right. No acute abnormality about the shoulder. Electronically Signed   By: Alcide CleverMark  Lukens M.D.   On: 03/19/2019 13:39   Dg Wrist Complete Right  Result Date: 03/19/2019 CLINICAL DATA:  Recent fall with wrist pain, initial encounter EXAM: RIGHT WRIST - COMPLETE 3+ VIEW COMPARISON:  None. FINDINGS: Degenerative changes are noted in the carpal  bones as well as the radiocarpal joint. No acute fracture or dislocation is noted. No soft tissue abnormality is seen. IMPRESSION: Degenerative change without acute abnormality. Electronically Signed   By: Inez Catalina M.D.   On: 03/19/2019 13:43   Dg Hip Operative Unilat W Or W/o Pelvis Right  Result Date: 03/19/2019 CLINICAL DATA:  IM nail EXAM: OPERATIVE right HIP (WITH PELVIS IF PERFORMED) 6 VIEWS TECHNIQUE: Fluoroscopic spot image(s) were submitted for interpretation post-operatively. COMPARISON:  03/19/2019 FINDINGS: Six low  resolution intraoperative spot views of the right hip. Total fluoroscopy time was 1 minutes 12 seconds. The images demonstrate comminuted right intertrochanteric fracture with subsequent intramedullary rodding of the femur and placement of fixating distal screw. IMPRESSION: Intraoperative fluoroscopic assistance provided during surgical fixation of right intertrochanteric fracture Electronically Signed   By: Donavan Foil M.D.   On: 03/19/2019 22:16   Dg Hip Unilat W Or Wo Pelvis 2-3 Views Right  Result Date: 03/19/2019 CLINICAL DATA:  Recent fall with right hip pain, initial encounter EXAM: DG HIP (WITH OR WITHOUT PELVIS) 2-3V RIGHT COMPARISON:  None. FINDINGS: Comminuted intratrochanteric fracture is noted in the proximal right femur. Impaction and angulation at the fracture site is noted. Previous pinning of the left hip is seen. Old healed pubic rami fractures on the right are noted. IMPRESSION: Comminuted intratrochanteric right femoral fracture. Electronically Signed   By: Inez Catalina M.D.   On: 03/19/2019 13:48   ASSESSMENT AND PLAN:   [name]  is a 85 y.o. female with a known history per below which includes dementia, chronic ataxia, status post mechanical fall while trying to sit down, resulting in right hip pain, in emergency room patient was found to have acute right intratrochanteric femoral fracture  *Acute right intratrochanteric femoral fracture status post mechanical fall -orthopedic surgery consult with Dr. Roland Rack appreciated  -POD #1  * hypothyroidism, unspecified Continue Synthroid  *Chronic dementia Continue psychotropic management fall/aspiration precautions while in house  *History of peptic ulcer disease PPI daily  *Acute hyperkalemia [place] p.o. x1 -k 4.0  *Chronic adult failure to thrive/deconditioning Dietary consulted  *DVT prophylaxis lovenox  Cscussed with Care Management/Social Worker for d/c planning  CODE STATUS: DNR   TOTAL TIME  TAKING CARE OF THIS PATIENT: *30* minutes.  >50% time spent on counselling and coordination of care  POSSIBLE D/C IN *2-3* DAYS, DEPENDING ON CLINICAL CONDITION.  Note: This dictation was prepared with Dragon dictation along with smaller phrase technology. Any transcriptional errors that result from this process are unintentional.  Fritzi Mandes M.D on 03/20/2019 at 11:20 AM  Between 7am to 6pm - Pager - [phone]  After 6pm go to [email or web address] - password [email or web address] [facility]  Office  [phone]  CC: Primary care physician; Dion Body, MDPatient ID: Coralie Keens, female   DOB: 11-26-1931, 85 y.o.   MRN: [id number]

## 2019-03-20 NOTE — Progress Notes (Signed)
Got call from [name] pt husband regarding my call to him this morning. I was attempting to reorient Suzanne Russo because she woke up very agitated. She was demanding for staff to leave her room and was referring to staff in a derogatory manner and swatted at a nurse at the bedside attempting to reorient. Several attempts were made to reorient and called husband in an effort to get him to call and speak to the pt. He was able to speak to Suzanne Russo and she agreed to take the medication she takes at home. Administered antibiotics and the ones she was use to taking at home. The pt is very suspicious that people are not telling her the truth and only " believes her husband". Pt took the medications administered with no issue and had some oatmeal. Will continue to monitor her and work on reorientation if needed.

## 2019-03-20 NOTE — Progress Notes (Signed)
Pt PIV was leaking removed. Pt has been very agitated and restless per provider okay have no PIV access to give the pt a rest will reassess later

## 2019-03-20 NOTE — Anesthesia Postprocedure Evaluation (Signed)
Anesthesia Post Note  Patient: Perri Aragones Lupien  Procedure(s) Performed: INTRAMEDULLARY (IM) NAIL INTERTROCHANTRIC (Right Hip)  Patient location during evaluation: PACU Anesthesia Type: General Level of consciousness: awake and alert Pain management: pain level controlled Vital Signs Assessment: post-procedure vital signs reviewed and stable Respiratory status: spontaneous breathing, nonlabored ventilation, respiratory function stable and patient connected to nasal cannula oxygen Cardiovascular status: blood pressure returned to baseline and stable Postop Assessment: no apparent nausea or vomiting Anesthetic complications: no     Last Vitals:  Vitals:   03/19/19 2317 03/20/19 0035  BP: 135/79 116/61  Pulse: 64 (!) 58  Resp: 19 20  Temp: 36.8 C 36.8 C  SpO2: 100% 100%    Last Pain:  Vitals:   03/20/19 0110  TempSrc:   PainSc: Asleep                 Martha Clan

## 2019-03-20 NOTE — TOC Initial Note (Signed)
Transition of Care Suzanne Russo(TOC) - Initial/Assessment Note    Patient Details  Name: Suzanne Russo MRN: [id number] Date of Birth: 02-05-1932  Transition of Care Suzanne Russo(TOC) CM/SW Contact:    Suzanne Dunkereliliah J Demorio Seeley, RN Phone Number: 03/20/2019, 9:52 AM  Clinical Narrative:                 Spoke with [name] on the phone at [phone] due to the patient being confused.  She lives at home with her husband and they have a 24 hour care giver,  She has DME at home consisting of RW, rollator with a seat, BSC, Shower seat, Raised toilet with arm supports.  She has used [employer] in the past and her husband wasn't happy with the PT assistant that came to the home, he requested for me to find out what other companies are INN, I sent a request thru the CMA bucket requesting a benefit check to be completed.  I will call the husband back with information once obtained.  The patient was recently a patient at [facility] and her husband is very much wanting to take her back home at [place] and not go to another SNF.  Will continue to monitor and follow up with information once obtained Expected Discharge Plan: Home w Home Health Services Barriers to Discharge: Continued Medical Work up   Patient Goals and CMS Choice Patient states their goals for this hospitalization and ongoing recovery are:: husband wants to go home CMS Medicare.gov Compare Post Acute Care list provided to:: Patient Represenative (must comment)([name]) Choice offered to / list presented to : Spouse  Expected Discharge Plan and Services Expected Discharge Plan: Home w Home Health Services   Discharge Planning Services: CM Consult Post Acute Care Choice: Home Health Living arrangements for the past 2 months: Single Family Home                 DME Arranged: N/A                    Prior Living Arrangements/Services Living arrangements for the past 2 months: Single Family Home Lives with:: Spouse, Other (Comment)(24 hour  caregiver) Patient language and need for interpreter reviewed:: No Do you feel safe going back to the place where you live?: Yes      Need for Family Participation in Patient Care: Yes (Comment) Care giver support system in place?: Yes (comment) Current home services: DME(Rolling walker, rollator with seat, shower chair, Bedside commode, toilet seat riser) Criminal Activity/Legal Involvement Pertinent to Current Situation/Hospitalization: No - Comment as needed  Activities of Daily Living Home Assistive Devices/Equipment: Cane (specify quad or straight), Walker (specify type) ADL Screening (condition at time of admission) Patient's cognitive ability adequate to safely complete daily activities?: No Is the patient deaf or have difficulty hearing?: No Does the patient have difficulty seeing, even when wearing glasses/contacts?: Yes Does the patient have difficulty concentrating, remembering, or making decisions?: No Patient able to express need for assistance with ADLs?: Yes Does the patient have difficulty dressing or bathing?: Yes Independently performs ADLs?: No Communication: Independent Dressing (OT): Independent Grooming: Independent Feeding: Independent Bathing: Independent Toileting: Independent In/Out Bed: Independent Walks in Home: Independent Does the patient have difficulty walking or climbing stairs?: Yes Weakness of Legs: Both Weakness of Arms/Hands: None  Permission Sought/Granted Permission sought to share information with : Case Manager Permission granted to share information with : Yes, Verbal Permission Granted     Permission granted to share info w  AGENCY: Any Home Health Agency        Emotional Assessment Appearance:: Appears stated age Attitude/Demeanor/Rapport: Inconsistent, Other (comment)(intermittent confusion) Affect (typically observed): Pleasant Orientation: : Oriented to Self, Fluctuating Orientation (Suspected and/or reported Sundowners) Alcohol  / Substance Use: Not Applicable Psych Involvement: No (comment)  Admission diagnosis:  Closed right hip fracture, initial encounter [facility]) [S72.001A] Patient Active Problem List   Diagnosis Date Noted  . Hip fx ([place]) 03/19/2019  . Dementia ([place]) 02/13/2019  . Aggression 02/10/2019  . Postconcussive syndrome 12/20/2018  . Closed fracture of Russo of sternum 03/20/2017  . Anemia 03/20/2017  . Generalized weakness 03/20/2017  . Pleuritic chest pain 03/20/2017  . Hypothyroidism 03/20/2017  . Rib fractures 03/18/2017  . Chronic venous insufficiency 03/14/2017  . Ankle ulcer, left, limited to breakdown of skin ([place]) 01/03/2017  . Cellulitis of lower extremity 11/14/2016  . Swelling of limb 11/14/2016  . Lymphedema 11/14/2016  . Headache disorder 08/26/2015  . Concussion with no loss of consciousness 08/26/2015  . Occipital neuralgia of left side 12/15/2014  . Memory difficulty 07/16/2014  . Trigeminal neuralgia 07/11/2013   PCP:  Suzanne Body, MD Pharmacy:   [place] [place] [facility] [place] [address] Phone: [phone] Fax: [phone]  [facility] [address] [place] [address] [place] [address] Phone: [phone] Fax: [phone]     Social Determinants of Health (SDOH) Interventions    Readmission Risk Interventions No flowsheet data found.

## 2019-03-20 NOTE — TOC Benefit Eligibility Note (Signed)
Transition of Care [facility]) Benefit Eligibility Note    Patient Details  Name: ISLA SABREE MRN: [id number] Date of Birth: [birth year]   Home Health Benefit Check  Number called: [phone] Rep: Judson Roch Reference Number: [id number]   [facility] Medicare Enhanced HMO policy effective as of 10/09/2018.  No deductible.  Out of pocket max of $3,500, of which $1,445.71 met as of time of call.    Home Health covered benefit with no copay or co-insurance required for member.  Prior required.  Number for agency to call to initiate auth: [phone] opt [phone].  The following are [place] agencies for this member's plan:  - [place] - [place] - [place] - [place] [phone]  Information above for in-network agencies obtained through provider search on [email or web address]     Dannette Barbara Phone Number: [phone] or [phone]  03/20/2019, 1:44 PM

## 2019-03-20 NOTE — Progress Notes (Signed)
Patient suffers from  Hip pain which is. from a Hip fracture and Hip surgery.                                         Hospital bed will eleviate pain by allowing The hip  to be positioned in ways not feasible with a normal bed. Pain episodes Frequently require         changes in body position which cannot be achieved with a normal bed

## 2019-03-21 LAB — CBC
HCT: 28.1 % — ABNORMAL LOW (ref 36.0–46.0)
Hemoglobin: 9 g/dL — ABNORMAL LOW (ref 12.0–15.0)
MCH: 29.5 pg (ref 26.0–34.0)
MCHC: 32 g/dL (ref 30.0–36.0)
MCV: 92.1 fL (ref 80.0–100.0)
Platelets: 295 10*3/uL (ref 150–400)
RBC: 3.05 MIL/uL — ABNORMAL LOW (ref 3.87–5.11)
RDW: 14.1 % (ref 11.5–15.5)
WBC: 11.2 10*3/uL — ABNORMAL HIGH (ref 4.0–10.5)
nRBC: 0 % (ref 0.0–0.2)

## 2019-03-21 LAB — BASIC METABOLIC PANEL
Anion gap: 4 — ABNORMAL LOW (ref 5–15)
BUN: 13 mg/dL (ref 8–23)
CO2: 27 mmol/L (ref 22–32)
Calcium: 8.1 mg/dL — ABNORMAL LOW (ref 8.9–10.3)
Chloride: 102 mmol/L (ref 98–111)
Creatinine, Ser: 0.43 mg/dL — ABNORMAL LOW (ref 0.44–1.00)
GFR calc Af Amer: >60 mL/min (ref >60)
GFR calc non Af Amer: >60 mL/min (ref >60)
Glucose, Bld: 116 mg/dL — ABNORMAL HIGH (ref 70–99)
Potassium: 3.5 mmol/L (ref 3.5–5.1)
Sodium: 133 mmol/L — ABNORMAL LOW (ref 135–145)

## 2019-03-21 LAB — VITAMIN B12: Vitamin B-12: 3664 pg/mL — ABNORMAL HIGH (ref 180–914)

## 2019-03-21 NOTE — TOC Progression Note (Signed)
Transition of Care [facility]) - Progression Note    Patient Details  Name: Suzanne Russo MRN: [id number] Date of Birth: 25-Apr-1932  Transition of [facility]) CM/SW Contact  Nakeyia Menden, Lenice Llamas Phone Number: [phone]  03/21/2019, 4:40 PM  Clinical Narrative: Clinical Social Worker (CSW) contacted patient's husband Mr. Houseworth to discuss D/C plan. Per husband he wants to take patient home and will have 24/7 caregivers in place by tomorrow afternoon. Husband requested EMS for transport and stated that the hospital bed will be delivered tomorrow between 11 am and 3 pm. Per husband he will call the 1A nurse's station tomorrow when hospital bed is delivered. Husband is agreeable to [facility] home health and outpatient palliative to follow patient. Husband also asked for a hoyer lift and MD has ordered it. [facility] [employer] agency representative is aware of above. Patient's daughter [name] is aware of above. Per [place] representative patient is already open to outpatient palliative care.   Plan is for patient to D/C home tomorrow with [facility] and outpatient palliative care. Patient's husband will call 1A when hospital bed is delivered tomorrow. CSW will continue to follow and assist as needed.     Expected Discharge Plan: [place] Barriers to Discharge: Continued Medical Work up  Expected Discharge Plan and Services Expected Discharge Plan: [place]   Discharge Planning Services: CM Consult Post Acute Care Choice: [place] arrangements for the past 2 months: Single Family Home                 DME Arranged: Hospital bed DME Agency: [employer] Date DME Agency Contacted: 03/20/19 Time DME Agency Contacted: [phone] Representative spoke with at DME Agency: [place]: Social Work, OT, PT, Nurse's Aide [place] Agency: [facility] Date [place]: 03/20/19 Time [place]: [phone] Representative spoke with at [place]: [place] ([place]) Interventions    Readmission Risk Interventions No flowsheet data found.

## 2019-03-21 NOTE — Progress Notes (Signed)
Subjective: 2 Days Post-Op Procedure(s) (LRB): INTRAMEDULLARY (IM) NAIL INTERTROCHANTRIC (Right) Patient reports pain as mild.   Patient is well, confused but not aggressive this morning. PT and care management to assist with discharge planning. Negative for chest pain and shortness of breath Fever: no Gastrointestinal:Negative for nausea and vomiting  Objective: Vital signs in last 24 hours: Temp:  [98.1 F (36.7 C)-99.1 F (37.3 C)] 99.1 F (37.3 C) (06/11 2302) Pulse Rate:  [66-73] 73 (06/11 2302) Resp:  [17-19] 19 (06/11 2302) BP: (117-158)/(61-85) 158/85 (06/11 2302) SpO2:  [96 %-99 %] 99 % (06/11 2302)  Intake/Output from previous day:  Intake/Output Summary (Last 24 hours) at 03/21/2019 0753 Last data filed at 03/20/2019 1336 Gross per 24 hour  Intake 240 ml  Output -  Net 240 ml    Intake/Output this shift: No intake/output data recorded.  Labs: Recent Labs    03/19/19 1347 03/20/19 0259  HGB 9.8* 8.3*   Recent Labs    03/19/19 1347 03/20/19 0259  WBC 5.9 7.8  RBC 3.25* 2.84*  HCT 30.4* 26.4*  PLT 200 259   Recent Labs    03/20/19 0259 03/21/19 0656  NA 138 133*  K 4.0 3.5  CL 104 102  CO2 26 27  BUN 17 13  CREATININE 0.50 0.43*  GLUCOSE 126* 116*  CALCIUM 8.1* 8.1*   No results for input(s): LABPT, INR in the last 72 hours.   EXAM General - Patient is Alert and Confused.  Extremity - ABD soft Sensation intact distally Intact pulses distally Dorsiflexion/Plantar flexion intact Incision: dressing C/D/I Dressing/Incision - clean, dry, no drainage Motor Function - intact, moving foot and toes well on exam.  Past Medical History:  Diagnosis Date  . Carpal tunnel syndrome   . Concussion with no loss of consciousness 08/26/2015  . Degenerative arthritis   . Dementia ([place]) 02/13/2019  . DVT (deep venous thrombosis) ([place])   . GERD (gastroesophageal reflux disease)   . Hemorrhoids   . Hypothyroidism   . Memory difficulty 07/16/2014  .  Occipital neuralgia   . Occipital neuralgia of left side 12/15/2014  . Osteoporosis   . Pelvic fracture ([place])   . Peptic ulcer disease   . Postconcussive syndrome 12/20/2018  . Right clavicle fracture   . Trigeminal neuralgia   . Venous insufficiency     Assessment/Plan: 2 Days Post-Op Procedure(s) (LRB): INTRAMEDULLARY (IM) NAIL INTERTROCHANTRIC (Right) Active Problems:   Hip fx (H[facility])  Estimated body mass index is 19.41 kg/m as calculated from the following:   Height as of this encounter: 5\' 4"  (1.626 m).   Weight as of this encounter: 51.3 kg. Advance diet Up with therapy D/C IV fluids when tolerating po diet.  CBC ordered for this morning. Up with therapy today. Care management to assist with discharge planning. Continue to work on BM. Patient is still confused this AM but not aggressive this morning.  DVT Prophylaxis - Lovenox, Foot Pumps and TED hose Weight-Bearing as tolerated to right leg  J. Cameron Proud, PA-C [facility] Orthopaedic Surgery 03/21/2019, 7:53 AM

## 2019-03-21 NOTE — Progress Notes (Signed)
[facility] [place] NAME: Suzanne Russo    MR#:  [id number]  DATE OF BIRTH:  1932/08/01  SUBJECTIVE:  patient came in after a fall mechanical. Status post hip surgery. No issues per RN. Has dementia.  More pleasant today--working with PT REVIEW OF SYSTEMS:   Review of Systems  Unable to perform ROS: Dementia   Tolerating Diet:yes Tolerating PT: [pending  DRUG ALLERGIES:   Allergies  Allergen Reactions  . Aricept [Donepezil]     GI upset  . Citalopram Other (See Comments)    Caused bad dreams  . Sulfa Antibiotics Rash  . Tetracycline Rash    VITALS:  Blood pressure 118/71, pulse 75, temperature 98.5 F (36.9 C), temperature source Oral, resp. rate 19, height 5\' 4"  (1.626 m), weight 51.3 kg, SpO2 98 %.  PHYSICAL EXAMINATION:   Physical Exam  GENERAL:  85 y.o.-year-old patient lying in the bed with no acute distress.  EYES: Pupils equal, round, reactive to light and accommodation. No scleral icterus. Extraocular muscles intact.  HEENT: Head atraumatic, normocephalic. Oropharynx and nasopharynx clear.  NECK:  Supple, no jugular venous distention. No thyroid enlargement, no tenderness.  LUNGS: Normal breath sounds bilaterally, no wheezing, rales, rhonchi. No use of accessory muscles of respiration.  CARDIOVASCULAR: S1, S2 normal. No murmurs, rubs, or gallops.  ABDOMEN: Soft, nontender, nondistended. Bowel sounds present. No organomegaly or mass.  EXTREMITIES: No cyanosis, clubbing or edema b/l.   Surgical dressing + NEUROLOGIC; grossly nonfocal. Moves all extremities well PSYCHIATRIC:  patient is alert and confused at baseline due to her dementia SKIN: No obvious rash, lesion, or ulcer.   LABORATORY PANEL:  CBC Recent Labs  Lab 03/21/19 0656  WBC 11.2*  HGB 9.0*  HCT 28.1*  PLT 295    Chemistries  Recent Labs  Lab 03/19/19 1347  03/21/19 0656  NA 136   < > 133*  K 5.4*   < > 3.5  CL 105   < > 102  CO2 25    < > 27  GLUCOSE 117*   < > 116*  BUN 21   < > 13  CREATININE 0.51   < > 0.43*  CALCIUM 7.8*   < > 8.1*  AST 25  --   --   ALT 16  --   --   ALKPHOS 57  --   --   BILITOT 0.7  --   --    < > = values in this interval not displayed.   Cardiac Enzymes No results for input(s): TROPONINI in the last 168 hours. RADIOLOGY:  Dg Chest 1 View  Result Date: 03/19/2019 CLINICAL DATA:  Recent fall with known hip fracture EXAM: CHEST  1 VIEW COMPARISON:  02/10/19 FINDINGS: Multiple old rib fractures are noted on the right. No pneumothorax is seen. No definitive acute rib fractures are noted. Cardiac shadow is at the upper limits of normal in size. No focal infiltrate or effusion is seen. IMPRESSION: Multiple old rib fractures on the right. No other focal abnormality is noted. Electronically Signed   By: Inez Catalina M.D.   On: 03/19/2019 13:49   Dg Shoulder Right  Result Date: 03/19/2019 CLINICAL DATA:  Fall today with right shoulder pain, initial encounter EXAM: RIGHT SHOULDER - 2+ VIEW COMPARISON:  12/25/2013 FINDINGS: Multiple healed rib fractures are noted on the right. No acute fracture or dislocation is noted. No gross soft tissue abnormality is seen. IMPRESSION: Old rib fractures on  the right. No acute abnormality about the shoulder. Electronically Signed   By: Alcide CleverMark  Lukens M.D.   On: 03/19/2019 13:39   Dg Wrist Complete Right  Result Date: 03/19/2019 CLINICAL DATA:  Recent fall with wrist pain, initial encounter EXAM: RIGHT WRIST - COMPLETE 3+ VIEW COMPARISON:  None. FINDINGS: Degenerative changes are noted in the carpal bones as well as the radiocarpal joint. No acute fracture or dislocation is noted. No soft tissue abnormality is seen. IMPRESSION: Degenerative change without acute abnormality. Electronically Signed   By: Alcide CleverMark  Lukens M.D.   On: 03/19/2019 13:43   Dg Hip Operative Unilat W Or W/o Pelvis Right  Result Date: 03/19/2019 CLINICAL DATA:  IM nail EXAM: OPERATIVE right HIP (WITH  PELVIS IF PERFORMED) 6 VIEWS TECHNIQUE: Fluoroscopic spot image(s) were submitted for interpretation post-operatively. COMPARISON:  03/19/2019 FINDINGS: Six low resolution intraoperative spot views of the right hip. Total fluoroscopy time was 1 minutes 12 seconds. The images demonstrate comminuted right intertrochanteric fracture with subsequent intramedullary rodding of the femur and placement of fixating distal screw. IMPRESSION: Intraoperative fluoroscopic assistance provided during surgical fixation of right intertrochanteric fracture Electronically Signed   By: Jasmine PangKim  Fujinaga M.D.   On: 03/19/2019 22:16   Dg Hip Unilat W Or Wo Pelvis 2-3 Views Right  Result Date: 03/19/2019 CLINICAL DATA:  Recent fall with right hip pain, initial encounter EXAM: DG HIP (WITH OR WITHOUT PELVIS) 2-3V RIGHT COMPARISON:  None. FINDINGS: Comminuted intratrochanteric fracture is noted in the proximal right femur. Impaction and angulation at the fracture site is noted. Previous pinning of the left hip is seen. Old healed pubic rami fractures on the right are noted. IMPRESSION: Comminuted intratrochanteric right femoral fracture. Electronically Signed   By: Alcide CleverMark  Lukens M.D.   On: 03/19/2019 13:48   ASSESSMENT AND PLAN:   Suzanne Russo  is a 85 y.o. female with a known history per below which includes dementia, chronic ataxia, status post mechanical fall while trying to sit down, resulting in right hip pain, in emergency room patient was found to have acute right intratrochanteric femoral fracture  *Acute right intratrochanteric femoral fracture status post mechanical fall -orthopedic surgery consult with Dr. Joice LoftsPoggi appreciated  -POD # 2 -cont PT  * hypothyroidism, unspecified Continue Synthroid  *Chronic dementia fall/aspiration precautions while in house -pt has intermittent agitation -cont zyprexa  *History of peptic ulcer disease PPI daily  *Acute hyperkalemia [place] p.o. x1 -k 4.0  *Chronic adult  failure to thrive/deconditioning Dietary consulted  *DVT prophylaxis lovenox  D/w husband--He is a bit overwhelmed with pt's overall medical issues. I mentioned to him [facility] hoping she will recover. Hospital bed ordered. He is requesting we d/c her tomorrow.  Cscussed with Care Management/Social Worker for d/c planning  CODE STATUS: DNR   TOTAL TIME TAKING CARE OF THIS PATIENT: *30* minutes.  >50% time spent on counselling and coordination of care  POSSIBLE D/C IN 1 DAYS, DEPENDING ON CLINICAL CONDITION.  Note: This dictation was prepared with Dragon dictation along with smaller phrase technology. Any transcriptional errors that result from this process are unintentional.  Enedina FinnerSona Izetta Sakamoto M.D on 03/21/2019 at 10:14 AM  Between 7am to 6pm - Pager - [phone]  After 6pm go to [email or web address] - password [email or web address]  [facility]  Office  [phone]  CC: Primary care physician; Marisue IvanLinthavong, [place], MDPatient ID: Suzanne Russo, female   DOB: [birth year], 85 y.o.   MRN: [id number]

## 2019-03-21 NOTE — Care Management Important Message (Signed)
Important Message  Patient Details  Name: Suzanne Russo MRN: [id number] Date of Birth: 06/24/1932   Medicare Important Message Given:  Yes    [name] 03/21/2019, 10:59 AM

## 2019-03-21 NOTE — Progress Notes (Signed)
Physical Therapy Treatment Patient Details Name: Suzanne Russo MRN: [id number] DOB: [birth year] Today's Date: 03/21/2019    History of Present Illness Suzanne Russo is an 70yoF who fell at the nail salon with subsequent Rt hip pain, found to have Rt trochanteric hip fracture, now s/p IM nailing and WBAT. PTA pt primarily tolerated household to limited-community distance AMB. Pt sustained a fall in February 2020 sustaining a skull fracture.    PT Comments    Pt awake, agreeable to PT session. Improved tolerance to AA/ROM of right leg, A/ROM of SAQ, but hip ROM still requires assistance d/t pain and ROM is limited to ~75 degrees hip flexion in supine (more likely while seated). Seated balance established more quickly. Still total A for bed mobility, ModA to rise from chair/bed, total A for pivot as both legs buckle both times and pt becomes limp essentially. Pt still very appropriate for DC to STR, however husband has elected to bring pt home after DC with 24/7 caregiver assistance.     Follow Up Recommendations  Supervision/Assistance - 24 hour;Supervision for mobility/OOB;Home health PT(still very appropriate for STR, but husband has elected for DC to home.)     Equipment Recommendations  Other (comment)    Recommendations for Other Services       Precautions / Restrictions Precautions Precautions: Fall Restrictions RLE Weight Bearing: Weight bearing as tolerated    Mobility  Bed Mobility Overal bed mobility: Needs Assistance Bed Mobility: Supine to Sit     Supine to sit: Total assist(gave verbal cues for arm placement to manage trunk pivot, but pt unable)        Transfers   Equipment used: Rolling walker (2 wheeled) Transfers: Sit to/from [facility]   Stand pivot transfers: +2 physical assistance;Total assist;Mod assist(ModA to rise, TotalA durign pivot, pt unable to take steps with BLE buckling and pt immediately anxious.)       General transfer  comment: Assisted with RW 2x, pt nervous but motivated and follows cues well. SImilar to 1DA COM remains far posterior to BOS despite cues to correct over 90 seconds, with 75% correction, but thighs still supported by bed. .  Ambulation/Gait Ambulation/Gait assistance: (unable at this time)               Stairs             Wheelchair Mobility    Modified Rankin (Stroke Patients Only)       Balance Overall balance assessment: Needs assistance;History of Falls Sitting-balance support: Single extremity supported Sitting balance-Leahy Scale: Zero Sitting balance - Comments: able to establish seated balance much more quickly this date.   Standing balance support: During functional activity                                Cognition Arousal/Alertness: Awake/alert Behavior During Therapy: WFL for tasks assessed/performed Overall Cognitive Status: Within Functional Limits for tasks assessed(baseline deficits)                                 General Comments: some mild memory impairment, generally calm and cooperative althougth slightly confused and acting occasionally inappropriate to situation      Exercises General Exercises - Lower Extremity Short Arc Quad: AAROM;Strengthening;Right;10 reps;Other (comment);Limitations Short Arc Quad Limitations: pain well managed Heel Slides: AAROM;Right;10 reps;Supine Hip ABduction/ADduction: AAROM;Right;10 reps;Supine    General Comments  Pertinent Vitals/Pain Pain Assessment: Faces Faces Pain Scale: Hurts even more Pain Location: Right thigh Pain Descriptors / Indicators: Sharp;Operative site guarding;Cramping Pain Intervention(s): Limited activity within patient's tolerance;Monitored during session;Premedicated before session;Repositioned(pain managed well until after transfers training)    Home Living                      Prior Function            PT Goals (current goals can  now be found in the care plan section) Acute Rehab PT Goals Patient Stated Goal: reduce pain in Right leg PT Goal Formulation: With patient Time For Goal Achievement: 04/03/19 Potential to Achieve Goals: Fair Progress towards PT goals: Progressing toward goals    Frequency    BID      PT Plan Other (comment);Discharge plan needs to be updated    Co-evaluation              AM-PAC PT "6 Clicks" Mobility   Outcome Measure  Help needed turning from your back to your side while in a flat bed without using bedrails?: Total Help needed moving from lying on your back to sitting on the side of a flat bed without using bedrails?: Total Help needed moving to and from a bed to a chair (including a wheelchair)?: Total Help needed standing up from a chair using your arms (e.g., wheelchair or bedside chair)?: A Lot Help needed to walk in hospital room?: Total Help needed climbing 3-5 steps with a railing? : Total 6 Click Score: 7    End of Session Equipment Utilized During Treatment: Gait belt Activity Tolerance: Patient tolerated treatment well;Patient limited by pain;Patient limited by fatigue Patient left: in bed;with call bell/phone within reach;with nursing/sitter in room Nurse Communication: Mobility status PT Visit Diagnosis: Other abnormalities of gait and mobility (R26.89);Muscle weakness (generalized) (M62.81);Difficulty in walking, not elsewhere classified (R26.2)     Time: [id number] PT Time Calculation (min) (ACUTE ONLY): 32 min  Charges:  $Gait Training: 8-22 mins $Therapeutic Exercise: 8-22 mins                     10:35 AM, 03/21/19 Suzanne LintsAllan C Mitzi Lilja, PT, DPT Physical Therapist - [facility]  [phone] (ASCOM)    Brantleigh Mifflin C 03/21/2019, 10:32 AM

## 2019-03-21 NOTE — Progress Notes (Signed)
Physical Therapy Treatment Patient Details Name: Suzanne Russo MRN: [id number] DOB: 04-15-1932 Today's Date: 03/21/2019    History of Present Illness Suzanne Russo is an 87yoF who fell at the nail salon with subsequent Rt hip pain, found to have Rt trochanteric hip fracture, now s/p IM nailing and WBAT. PTA pt primarily tolerated household to limited-community distance AMB. Pt sustained a fall in February 2020 sustaining a skull fracture.    PT Comments    Pt still up to chair, awake, recognizes author, reports discomfort from chair where he'd slid down. Pt slightly more drowsy than AM session. Pt requires more physical assist to rise from chair ~MaxA, less successful at fully rising. Pt totalA transfer chair to bed, Max A for return to supine. Pt with much higher levels of anxiety than earlier this date. Excessive leg buckling with transfer to chair. Pt falling asleep after return to bed, RN made aware. Called husband to give updates.      Follow Up Recommendations  Supervision/Assistance - 24 hour;Supervision for mobility/OOB;Home health PT     Equipment Recommendations  Other (comment)    Recommendations for Other Services       Precautions / Restrictions Precautions Precautions: Fall Restrictions RLE Weight Bearing: Weight bearing as tolerated    Mobility  Bed Mobility Overal bed mobility: Needs Assistance Bed Mobility: Sit to Supine     Supine to sit: +2 for physical assistance Sit to supine: +2 for physical assistance      Transfers Overall transfer level: Needs assistance Equipment used: Rolling walker (2 wheeled) Transfers: Sit to/from [employer];Stand Pivot Transfers Sit to Stand: +2 physical assistance;Max assist(mor edifficulty fully rising, becomes anxious and panicy about falling) Stand pivot transfers: +2 physical assistance;Total assist(still very anxious with BLE buckling)          Ambulation/Gait                 Stairs              Wheelchair Mobility    Modified Rankin (Stroke Patients Only)       Balance                                            Cognition Arousal/Alertness: Awake/alert Behavior During Therapy: WFL for tasks assessed/performed Overall Cognitive Status: Within Functional Limits for tasks assessed                                 General Comments: some mild memory impairment, generally calm and cooperative althougth slightly confused and acting occasionally inappropriate to situation; more confused this afternoon      Exercises General Exercises - Lower Extremity Long Arc Quad: AROM;10 reps;Seated;Both    General Comments        Pertinent Vitals/Pain Pain Assessment: Faces Faces Pain Scale: Hurts little more Pain Location: Right thigh Pain Descriptors / Indicators: Sharp;Operative site guarding;Cramping Pain Intervention(s): Limited activity within patient's tolerance;Monitored during session;Premedicated before session;Repositioned    Home Living                      Prior Function            PT Goals (current goals can now be found in the care plan section) Acute Rehab PT Goals Patient Stated Goal: reduce pain in Right  leg PT Goal Formulation: With patient Time For Goal Achievement: 04/03/19 Potential to Achieve Goals: Fair Progress towards PT goals: Progressing toward goals    Frequency    BID      PT Plan Other (comment);Discharge plan needs to be updated    Co-evaluation              AM-PAC PT "6 Clicks" Mobility   Outcome Measure  Help needed turning from your back to your side while in a flat bed without using bedrails?: Total Help needed moving from lying on your back to sitting on the side of a flat bed without using bedrails?: Total Help needed moving to and from a bed to a chair (including a wheelchair)?: Total Help needed standing up from a chair using your arms (e.g., wheelchair or bedside  chair)?: A Lot Help needed to walk in hospital room?: Total Help needed climbing 3-5 steps with a railing? : Total 6 Click Score: 7    End of Session Equipment Utilized During Treatment: Gait belt Activity Tolerance: Patient tolerated treatment well;Patient limited by pain;Patient limited by fatigue Patient left: in bed;with call bell/phone within reach;with nursing/sitter in room;with bed alarm set Nurse Communication: Mobility status PT Visit Diagnosis: Other abnormalities of gait and mobility (R26.89);Muscle weakness (generalized) (M62.81);Difficulty in walking, not elsewhere classified (R26.2)     Time: 8341-9622 PT Time Calculation (min) (ACUTE ONLY): 20 min  Charges:  $Therapeutic Exercise: 8-22 mins                     2:55 PM, 03/21/19 Etta Grandchild, PT, DPT Physical Therapist - [facility]  [phone] ([place])    Suzanne Russo 03/21/2019, 2:47 PM

## 2019-03-21 NOTE — Progress Notes (Signed)
Notified of new outpatient Palliative referral by [facility], patient is already followed at home by outpatient palliative. Plan is for discharge home tomorrow with [facility] home health. Palliative team notified. Flo Shanks BSN, RN, [facility] [employer] [phone]

## 2019-03-22 LAB — BASIC METABOLIC PANEL
Anion gap: 9 (ref 5–15)
BUN: 20 mg/dL (ref 8–23)
CO2: 28 mmol/L (ref 22–32)
Calcium: 8.5 mg/dL — ABNORMAL LOW (ref 8.9–10.3)
Chloride: 98 mmol/L (ref 98–111)
Creatinine, Ser: 0.47 mg/dL (ref 0.44–1.00)
GFR calc Af Amer: >60 mL/min (ref >60)
GFR calc non Af Amer: >60 mL/min (ref >60)
Glucose, Bld: 96 mg/dL (ref 70–99)
Potassium: 3.3 mmol/L — ABNORMAL LOW (ref 3.5–5.1)
Sodium: 135 mmol/L (ref 135–145)

## 2019-03-22 MED ORDER — BISACODYL 5 MG PO TBEC: 5.0000 mg | DELAYED_RELEASE_TABLET | Freq: Every day | ORAL | 0 refills | Status: AC | PRN

## 2019-03-22 MED ORDER — SENNOSIDES-DOCUSATE SODIUM 8.6-50 MG PO TABS: 1.0000 | ORAL_TABLET | Freq: Every evening | ORAL | 0 refills | Status: AC | PRN

## 2019-03-22 MED ORDER — POTASSIUM CHLORIDE CRYS ER 20 MEQ PO TBCR: 40.0000 meq | EXTENDED_RELEASE_TABLET | Freq: Once | ORAL | Status: DC

## 2019-03-22 MED ORDER — HYDROCODONE-ACETAMINOPHEN 5-325 MG PO TABS: 1.0000 | ORAL_TABLET | Freq: Four times a day (QID) | ORAL | 0 refills | Status: AC | PRN

## 2019-03-22 MED ORDER — ENOXAPARIN SODIUM 40 MG/0.4ML ~~LOC~~ SOLN: 40.0000 mg | SUBCUTANEOUS | 0 refills | Status: DC

## 2019-03-22 NOTE — Discharge Summary (Signed)
[employer] [facility] [place] [facility]   PATIENT NAME: Suzanne Russo    MR#:  [id number]  DATE OF BIRTH:  [birth year]  DATE OF ADMISSION:  03/19/2019   ADMITTING PHYSICIAN: Bertrum SolMontell D Salary, MD  DATE OF DISCHARGE: 03/22/2019  PRIMARY CARE PHYSICIAN: Marisue IvanLinthavong, Kanhka, MD   ADMISSION DIAGNOSIS:  Closed right hip fracture, initial encounter (HCC) [S72.001A] DISCHARGE DIAGNOSIS:  Active Problems:   Hip fx (HCC)  SECONDARY DIAGNOSIS:   Past Medical History:  Diagnosis Date  . Carpal tunnel syndrome   . Concussion with no loss of consciousness 08/26/2015  . Degenerative arthritis   . Dementia (HCC) 02/13/2019  . DVT (deep venous thrombosis) (HCC)   . GERD (gastroesophageal reflux disease)   . Hemorrhoids   . Hypothyroidism   . Memory difficulty 07/16/2014  . Occipital neuralgia   . Occipital neuralgia of left side 12/15/2014  . Osteoporosis   . Pelvic fracture (HCC)   . Peptic ulcer disease   . Postconcussive syndrome 12/20/2018  . Right clavicle fracture   . Trigeminal neuralgia   . Venous insufficiency    HOSPITAL COURSE:  NancyReamsis a85 y.o.femalewith a known history per below which includes dementia, chronic ataxia, status post mechanical fall while trying to sit down, resulting in right hip pain, in emergency room patient was found to have acute right intratrochanteric femoral fracture  *Acute right intratrochanteric femoral fracture status post mechanical fall -orthopedic surgery consult with Dr. Joice LoftsPoggi appreciated  -POD # 3 Home health and PT.  Lovenox for DVT prophylaxis for 14 days, SCD. Pain control.  * hypothyroidism, unspecified Continue Synthroid  *Chronic dementia fall/aspirationprecautions while in house -pt has intermittent agitation -cont zyprexa  *History of peptic ulcer disease PPI daily  *Acute hyperkalemia Veltassa p.o. x1, improved.  Hypokalemia.  Potassium 3.3, given 1 dose potassium.  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 DISCHARGE CONDITIONS:  Stable, discharge to home with home health and palliative care at home today. CONSULTS OBTAINED:  Treatment Team:  Christena FlakePoggi, John J, MD DRUG ALLERGIES:   Allergies  Allergen Reactions  . Aricept [Donepezil]     GI upset  . Citalopram Other (See Comments)    Caused bad dreams  . Sulfa Antibiotics Rash  . Tetracycline Rash   DISCHARGE MEDICATIONS:   Allergies as of 03/22/2019      Reactions   Aricept [donepezil]    GI upset   Citalopram Other (See Comments)   Caused bad dreams   Sulfa Antibiotics Rash   Tetracycline Rash      Medication List    STOP taking these medications   celecoxib 200 MG capsule Commonly known as: CELEBREX     TAKE these medications   B-12 PO Take 2,500 mcg by mouth daily.   bisacodyl 5 MG EC tablet Commonly known as: DULCOLAX Take 1 tablet (5 mg total) by mouth daily as needed for moderate constipation.   Caltrate 600+D Plus Minerals 600-800 MG-UNIT Tabs Take 1 tablet by mouth 2 (two) times daily.   carbamazepine 200 MG 12 hr capsule Commonly known as: Carbatrol Take 1 capsule (200 mg total) by mouth 2 (two) times daily.   carbamazepine 300 MG 12 hr capsule Commonly known as: CARBATROL TAKE 1 CAPSULE BY MOUTH TWICE DAILY   enoxaparin 40 MG/0.4ML injection Commonly known as: LOVENOX Inject 0.4 mLs (40 mg total) into the skin daily for 14 days.   HYDROcodone-acetaminophen 5-325 MG tablet Commonly known as: NORCO/VICODIN  Take 1 tablet by mouth every 6 (six) hours as needed for up to 5 days for moderate pain or severe pain.   hydrocortisone 25 MG suppository Commonly known as: ANUSOL-HC Place 25 mg rectally 2 (two) times daily as needed for hemorrhoids or anal itching.   levothyroxine 100 MCG tablet Commonly known as: SYNTHROID Take 100 mcg by mouth daily before breakfast.   loratadine-pseudoephedrine 10-240 MG 24 hr tablet  Commonly known as: CLARITIN-D 24-hour Take 1 tablet by mouth daily.   meclizine 25 MG tablet Commonly known as: ANTIVERT Take 25 mg by mouth every 6 (six) hours as needed.   OLANZapine 2.5 MG tablet Commonly known as: ZyPREXA 1 in the morning, 2 in the evening What changed:   how much to take  how to take this  when to take this  additional instructions   omeprazole 20 MG capsule Commonly known as: PRILOSEC Take 20 mg by mouth daily.   senna-docusate 8.6-50 MG tablet Commonly known as: Senokot-S Take 1 tablet by mouth at bedtime as needed for mild constipation.   triamcinolone cream 0.1 % Commonly known as: KENALOG Apply 1 application topically 2 (two) times daily.   TYLENOL 500 MG tablet Generic drug: acetaminophen Take 1,000 mg by mouth every 8 (eight) hours.   zolendronic acid 4 MG/5ML injection Commonly known as: ZOMETA Inject 4 mg into the vein once. Last received inj 11/07/17            Durable Medical Equipment  (From admission, onward)         Start     Ordered   03/21/19 1530  For home use only DME Other see comment  Once    Comments: Michiel SitesHoyer lift  Question:  Length of Need  Answer:  Lifetime   03/21/19 1529   03/20/19 1507  For home use only DME Hospital bed  Once    Question Answer Comment  Length of Need 12 Months   Patient has (list medical condition): Pain from Hip Fracture   The above medical condition requires: Patient requires the ability to reposition frequently   Bed type Semi-electric   Support Surface: Gel Overlay      03/20/19 1508           DISCHARGE INSTRUCTIONS:  See AVS.  If you experience worsening of your admission symptoms, develop shortness of breath, life threatening emergency, suicidal or homicidal thoughts you must seek medical attention immediately by calling 911 or calling your MD immediately  if symptoms less severe.  You Must read complete instructions/literature along with all the possible adverse  reactions/side effects for all the Medicines you take and that have been prescribed to you. Take any new Medicines after you have completely understood and accpet all the possible adverse reactions/side effects.   Please note  You were cared for by a hospitalist during your hospital stay. If you have any questions about your discharge medications or the care you received while you were in the hospital after you are discharged, you can call the unit and asked to speak with the hospitalist on call if the hospitalist that took care of you is not available. Once you are discharged, your primary care physician will handle any further medical issues. Please note that NO REFILLS for any discharge medications will be authorized once you are discharged, as it is imperative that you return to your primary care physician (or establish a relationship with a primary care physician if you do not have one) for your  aftercare needs so that they can reassess your need for medications and monitor your lab values.    On the day of Discharge:  VITAL SIGNS:  Blood pressure 120/68, pulse 92, temperature 98.2 F (36.8 C), resp. rate 17, height 5\' 4"  (1.626 m), weight 51.3 kg, SpO2 98 %. PHYSICAL EXAMINATION:  GENERAL:  85 y.o.-year-old patient lying in the bed with no acute distress.  EYES: Pupils equal, round, reactive to light and accommodation. No scleral icterus. Extraocular muscles intact.  HEENT: Head atraumatic, normocephalic. Oropharynx and nasopharynx clear.  NECK:  Supple, no jugular venous distention. No thyroid enlargement, no tenderness.  LUNGS: Normal breath sounds bilaterally, no wheezing, rales,rhonchi or crepitation. No use of accessory muscles of respiration.  CARDIOVASCULAR: S1, S2 normal. No murmurs, rubs, or gallops.  ABDOMEN: Soft, non-tender, non-distended. Bowel sounds present. No organomegaly or mass.  EXTREMITIES: No pedal edema, cyanosis, or clubbing.  NEUROLOGIC: grossly nonfocal. Moves all  extremities well. PSYCHIATRIC: The patient is demented. SKIN: No obvious rash, lesion, or ulcer.  DATA REVIEW:   CBC Recent Labs  Lab 03/21/19 0656  WBC 11.2*  HGB 9.0*  HCT 28.1*  PLT 295    Chemistries  Recent Labs  Lab 03/19/19 1347  03/22/19 0447  NA 136   < > 135  K 5.4*   < > 3.3*  CL 105   < > 98  CO2 25   < > 28  GLUCOSE 117*   < > 96  BUN 21   < > 20  CREATININE 0.51   < > 0.47  CALCIUM 7.8*   < > 8.5*  AST 25  --   --   ALT 16  --   --   ALKPHOS 57  --   --   BILITOT 0.7  --   --    < > = values in this interval not displayed.     Microbiology Results  Results for orders placed or performed during the hospital encounter of 03/19/19  SARS Coronavirus 2 (CEPHEID - Performed in [facility] lab), Hosp Order     Status: None   Collection Time: 03/19/19  1:47 PM   Specimen: Nasopharyngeal Swab  Result Value Ref Range Status   SARS Coronavirus 2 NEGATIVE NEGATIVE Final    Comment: (NOTE) If result is NEGATIVE SARS-CoV-2 target nucleic acids are NOT DETECTED. The SARS-CoV-2 RNA is generally detectable in upper and lower  respiratory specimens during the acute phase of infection. The lowest  concentration of SARS-CoV-2 viral copies this assay can detect is 250  copies / mL. A negative result does not preclude SARS-CoV-2 infection  and should not be used as the sole basis for treatment or other  patient management decisions.  A negative result may occur with  improper specimen collection / handling, submission of specimen other  than nasopharyngeal swab, presence of viral mutation(s) within the  areas targeted by this assay, and inadequate number of viral copies  (<250 copies / mL). A negative result must be combined with clinical  observations, patient history, and epidemiological information. If result is POSITIVE SARS-CoV-2 target nucleic acids are DETECTED. The SARS-CoV-2 RNA is generally detectable in upper and lower  respiratory specimens dur  ing the acute phase of infection.  Positive  results are indicative of active infection with SARS-CoV-2.  Clinical  correlation with patient history and other diagnostic information is  necessary to determine patient infection status.  Positive results do  not rule out bacterial infection or  co-infection with other viruses. If result is PRESUMPTIVE POSTIVE SARS-CoV-2 nucleic acids MAY BE PRESENT.   A presumptive positive result was obtained on the submitted specimen  and confirmed on repeat testing.  While 2019 novel coronavirus  (SARS-CoV-2) nucleic acids may be present in the submitted sample  additional confirmatory testing may be necessary for epidemiological  and / or clinical management purposes  to differentiate between  SARS-CoV-2 and other Sarbecovirus currently known to infect humans.  If clinically indicated additional testing with an alternate test  methodology [phone]) is advised. The SARS-CoV-2 RNA is generally  detectable in upper and lower respiratory sp ecimens during the acute  phase of infection. The expected result is Negative. Fact Sheet for Patients:  StrictlyIdeas.no Fact Sheet for Healthcare Providers: BankingDealers.co.za This test is not yet approved or cleared by the [place] FDA and has been authorized for detection and/or diagnosis of SARS-CoV-2 by FDA under an Emergency Use Authorization (EUA).  This EUA will remain in effect (meaning this test can be used) for the duration of the COVID-19 declaration under Section 564(b)(1) of the Act, 21 U.S.C. section 360bbb-3(b)(1), unless the authorization is terminated or revoked sooner. Performed at [facility], [address], [place], [place] [address]   Surgical pcr screen     Status: None   Collection Time: 03/19/19  4:06 PM   Specimen: Nasal Mucosa; Nasal Swab  Result Value Ref Range Status   MRSA, PCR NEGATIVE NEGATIVE Final   Staphylococcus  aureus NEGATIVE NEGATIVE Final    Comment: (NOTE) The Xpert SA Assay (FDA approved for NASAL specimens in patients 44 years of age and older), is one component of a comprehensive surveillance program. It is not intended to diagnose infection nor to guide or monitor treatment. Performed at [facility], [address], [place], [place] [address]     RADIOLOGY:  No results found.   Management plans discussed with the patient, family and they are in agreement.  CODE STATUS: DNR   TOTAL TIME TAKING CARE OF THIS PATIENT: 35 minutes.    Demetrios Loll M.D on 03/22/2019 at 12:20 PM  Between 7am to 6pm - Pager - [phone]  After 6pm go to [email or web address] - Proofreader  [employer] [facility] [place] [facility]  Office  [phone]  CC: Primary care physician; Dion Body, MD   Note: This dictation was prepared with Dragon dictation along with smaller phrase technology. Any transcriptional errors that result from this process are unintentional.

## 2019-03-22 NOTE — Discharge Instructions (Signed)
Palliative care at home, HHPT, fall precaution. Lovenox 40 mg subcu daily x14 days at discharge TED hose bilateral lower extremities x6 weeks, remove at night

## 2019-03-22 NOTE — TOC Transition Note (Signed)
Transition of Care [facility]) - CM/SW Discharge Note   Patient Details  Name: Suzanne Russo MRN: [id number] Date of Birth: Jun 23, 1932  Transition of Care [facility]) CM/SW Contact:  Latanya Maudlin, RN Phone Number: 03/22/2019, 9:52 AM   Clinical Narrative:  Patient to be discharged per MD order. Orders in place for home health services. Previous RNCM had completed home health referral with [facility]. Notified [place] of pending discharge. Outpatient palliative referral was started with [facility]. DME hospital bed planned for delivery today via [employer]. Patient will need EMS for transport.      Final next level of care: Home w Home Health Services Barriers to Discharge: No Barriers Identified   Patient Goals and CMS Choice Patient states their goals for this hospitalization and ongoing recovery are:: husband wants to go home CMS Medicare.gov Compare Post Acute Care list provided to:: Patient Represenative (must comment)(pt confused) Choice offered to / list presented to : Spouse  Discharge Placement                       Discharge Plan and Services   Discharge Planning Services: CM Consult Post Acute Care Choice: Home Health          DME Arranged: Hospital bed DME Agency: [employer] Date DME Agency Contacted: 03/20/19 Time DME Agency Contacted: 3500 Representative spoke with at DME Agency: [place]: PT, RN, Nurse's Aide [place] Agency: [facility] Date [place]: 03/22/19 Time [place]: [phone] Representative spoke with at [place]: [place]) Interventions     Readmission Risk Interventions Readmission Risk Prevention Plan 03/22/2019  Transportation Screening Complete  PCP or Specialist Appt within 3-5 Days Complete  HRI or [place] Complete  Social Work Consult for [place] Planning/Counseling Complete  Palliative Care Screening Complete  Medication Review Press photographer) Complete  Some  recent data might be hidden

## 2019-03-22 NOTE — Progress Notes (Signed)
Physical Therapy Treatment Patient Details Name: Suzanne Russo MRN: [id number] DOB: 1932-07-03 Today's Date: 03/22/2019       PT Comments    Pt in bed, ready for session.  Participated in exercises as described below.  To edge of bed with mod a x 1.  She was able to assist some but it was limited.  Once sitting, she was able to sit unsupported with supervision for safety but overall did well.  Standing trials x 3 at edge of bed.  She is able to initiate stand but is unable to stand upright despite max assist.  B feet do slide on the floor despite socks and blocking her feet as she does tend to push back some once halfway up despite cues.  Returned to supine with max a x 1.  Plan is d/c to home with husband today.   Follow Up Recommendations  SNF     Equipment Recommendations  Rolling walker with 5" wheels;Wheelchair cushion (measurements PT);Other (comment);Wheelchair (measurements PT)    Recommendations for Other Services       Precautions / Restrictions Restrictions Weight Bearing Restrictions: No RLE Weight Bearing: Weight bearing as tolerated    Mobility  Bed Mobility Overal bed mobility: Needs Assistance Bed Mobility: Supine to Sit;Sit to Supine     Supine to sit: Mod assist Sit to supine: Max assist   General bed mobility comments: assists as she is able but limited.  Transfers Overall transfer level: Needs assistance Equipment used: None Transfers: Sit to/from Stand Sit to Stand: Max assist;Total assist         General transfer comment: Pt with good effort to initiate standing but is unable to stand fully despite max assist.  Ambulation/Gait             General Gait Details: unable   Stairs             Wheelchair Mobility    Modified Rankin (Stroke Patients Only)       Balance Overall balance assessment: Needs assistance Sitting-balance support: Feet supported Sitting balance-Leahy Scale: Fair Sitting balance - Comments: able to  sit unsupported today but requires supervision for safety.  Unsafe to be left alone.   Standing balance support: Bilateral upper extremity supported Standing balance-Leahy Scale: Zero Standing balance comment: unable to stand without extensive assist.                            Cognition Arousal/Alertness: Awake/alert Behavior During Therapy: WFL for tasks assessed/performed                                          Exercises Other Exercises Other Exercises: BLE AAROM x 10 for ankle pumps, heel slides, ab/add adn SLR    General Comments        Pertinent Vitals/Pain Pain Assessment: Faces Faces Pain Scale: Hurts little more Pain Location: Generally tolerates well Pain Descriptors / Indicators: Operative site guarding;Grimacing;Guarding Pain Intervention(s): Limited activity within patient's tolerance;Monitored during session    Home Living                      Prior Function            PT Goals (current goals can now be found in the care plan section) Progress towards PT goals: Progressing toward goals  Frequency    BID      PT Plan Current plan remains appropriate    Co-evaluation              AM-PAC PT "6 Clicks" Mobility   Outcome Measure  Help needed turning from your back to your side while in a flat bed without using bedrails?: A Lot Help needed moving from lying on your back to sitting on the side of a flat bed without using bedrails?: Total Help needed moving to and from a bed to a chair (including a wheelchair)?: Total Help needed standing up from a chair using your arms (e.g., wheelchair or bedside chair)?: Total Help needed to walk in hospital room?: Total Help needed climbing 3-5 steps with a railing? : Total 6 Click Score: 7    End of Session Equipment Utilized During Treatment: Gait belt Activity Tolerance: Patient tolerated treatment well Patient left: in bed;with bed alarm set;with call  bell/phone within reach         Time: 0915-0935 PT Time Calculation (min) (ACUTE ONLY): 20 min  Charges:  $Therapeutic Exercise: 8-22 mins                     Danielle DessSarah Trea Latner, PTA 03/22/19, 11:36 AM

## 2019-03-22 NOTE — Progress Notes (Addendum)
Subjective: 3 Days Post-Op Procedure(s) (LRB): INTRAMEDULLARY (IM) NAIL INTERTROCHANTRIC (Right) Patient reports pain as mild.   Patient is well, slightly confused but pleasant. PT and care management to assist with discharge planning. Negative for chest pain and shortness of breath Fever: no Gastrointestinal:Negative for nausea and vomiting  Objective: Vital signs in last 24 hours: Temp:  [97.7 F (36.5 C)-98.5 F (36.9 C)] 98.2 F (36.8 C) (06/13 0738) Pulse Rate:  [84-108] 92 (06/13 0738) Resp:  [17] 17 (06/13 0738) BP: (115-120)/(62-70) 120/68 (06/13 0738) SpO2:  [96 %-99 %] 98 % (06/13 0738)  Intake/Output from previous day:  Intake/Output Summary (Last 24 hours) at 03/22/2019 0806 Last data filed at 03/21/2019 1349 Gross per 24 hour  Intake 240 ml  Output -  Net 240 ml    Intake/Output this shift: No intake/output data recorded.  Labs: Recent Labs    03/19/19 1347 03/20/19 0259 03/21/19 0656  HGB 9.8* 8.3* 9.0*   Recent Labs    03/20/19 0259 03/21/19 0656  WBC 7.8 11.2*  RBC 2.84* 3.05*  HCT 26.4* 28.1*  PLT 259 295   Recent Labs    03/21/19 0656 03/22/19 0447  NA 133* 135  K 3.5 3.3*  CL 102 98  CO2 27 28  BUN 13 20  CREATININE 0.43* 0.47  GLUCOSE 116* 96  CALCIUM 8.1* 8.5*   No results for input(s): LABPT, INR in the last 72 hours.   EXAM General - Patient is Alert and Confused.  Extremity - ABD soft Sensation intact distally Intact pulses distally Dorsiflexion/Plantar flexion intact Incision: dressing C/D/I Dressing/Incision - clean, dry, no drainage Motor Function - intact, moving foot and toes well on exam.  Past Medical History:  Diagnosis Date  . Carpal tunnel syndrome   . Concussion with no loss of consciousness 08/26/2015  . Degenerative arthritis   . Dementia ([place]) 02/13/2019  . DVT (deep venous thrombosis) ([place])   . GERD (gastroesophageal reflux disease)   . Hemorrhoids   . Hypothyroidism   . Memory difficulty  07/16/2014  . Occipital neuralgia   . Occipital neuralgia of left side 12/15/2014  . Osteoporosis   . Pelvic fracture ([place])   . Peptic ulcer disease   . Postconcussive syndrome 12/20/2018  . Right clavicle fracture   . Trigeminal neuralgia   . Venous insufficiency     Assessment/Plan: 3 Days Post-Op Procedure(s) (LRB): INTRAMEDULLARY (IM) NAIL INTERTROCHANTRIC (Right) Active Problems:   Hip fx (HCC)  Estimated body mass index is 19.41 kg/m as calculated from the following:   Height as of this encounter: 5\' 4"  (1.626 m).   Weight as of this encounter: 51.3 kg. Advance diet Up with therapy, weightbearing as tolerated right lower extremity Hemoglobin 9.0, stable trending up Pain well controlled Vital signs stable Mental status at baseline Care management to assist with discharge to home with palliative care  Lovenox 40 mg subcu daily x14 days at discharge TED hose bilateral lower extremities x6 weeks, remove at night Follow-up with [facility] orthopedics in 2 weeks for staple removal and Steri-Strip application  DVT Prophylaxis - Lovenox, Foot Pumps and TED hose Weight-Bearing as tolerated to right leg  Duanne Guess, PA-C [facility] 03/22/2019, 8:06 AM

## 2019-03-22 NOTE — Progress Notes (Signed)
Went over discharge instructions witth husband [name]. Pt husband had no questions called EMS for transport.

## 2019-03-25 ENCOUNTER — Telehealth: Payer: Self-pay | Admitting: Student

## 2019-03-25 NOTE — Telephone Encounter (Signed)
03/24/2019 11:28am. Palliative NP returned call after receiving message from patient's daughter Suzanne Russo. Spoke with both husband and Suzanne Russo. Patient has returned home from hospital on Saturday after fracturing her hip. She is going to receive therapy in the home through [facility]. Mr. Ertle reports patient doing fairly well, reports little pain. Denies any symptoms needing managed at present. He asks about bedside table, disposable pads and gloves. He is advised that Palliative does not provide these and is advised on where he can purchase/obtain these. He denies any needs at this time.

## 2019-03-28 ENCOUNTER — Telehealth: Payer: Self-pay | Admitting: Student

## 2019-03-28 NOTE — Telephone Encounter (Signed)
NP returned call to patient's home after receiving a message from daughter [name]. Patient has a foam dressing on her sacrum that they believe was placed for preventative measures and they are asking if they can remove. Caregiver Georga Kaufmann is to remove later when patient is awake and will check skin; she will notify NP if there are any skin issues.  Follow up palliative appointment scheduled on 04/01/2019.

## 2019-04-01 ENCOUNTER — Other Ambulatory Visit: Payer: Self-pay

## 2019-04-01 ENCOUNTER — Other Ambulatory Visit: Payer: Medicare Other | Admitting: Student

## 2019-04-01 DIAGNOSIS — Z515 Encounter for palliative care: Secondary | ICD-10-CM

## 2019-04-01 NOTE — Progress Notes (Signed)
[facility] Consult Note Telephone: [phone]  Fax: [phone]  PATIENT NAME: Suzanne Russo DOB: Jun 05, 1932 MRN: [id number]  PRIMARY CARE PROVIDER:   Marisue IvanLinthavong, [place], MD  REFERRING PROVIDER:  Marisue IvanLinthavong, [place], MD [phone] [facility] [address] [facility] [place] [id number]  RESPONSIBLE PARTY:  Husband, Suzanne Russo  ASSESSMENT: Mrs. Suzanne Russo is resting in recliner upon arrival. We discussed ongoing goals of care and symptom management. Mrs. Suzanne Russo is pleasant; no acute distress noted. She engages in conversation, but does close her eyes during visit. Mr. Suzanne Russo does express some caregiver fatigue; we discussed delegating some duties to hired caregivers in the home. He is also going to speak with person who coordinates caregivers to see if they can send same caregivers to help establish a better routine for patient. Palliative Medicine will continue to provide support and make recommendations as needed.        RECOMMENDATIONS and PLAN:  1. Code status: DNR. 2. Medical goals of therapy: Mrs. Suzanne Russo will continue to receive occupational, physical and speech therapy as directed. Will monitor for changes and declines and make recommendations as needed.  3. Symptom management: pain-continue acetaminophen 500mg  two tabs every 8 hours prn. Agitation-continue olanzapine as directed.   4. Discharge Planning: Patient will continue to reside at home with husband and 24/7 caregivers; [facility] at this [facility].  Palliative Medicine will follow up in 4 weeks or sooner, if needed.   I spent 30 minutes providing this consultation,  from 12:00pm to 12:30pm More than 50% of the time in this consultation was spent coordinating communication.   HISTORY OF PRESENT ILLNESS:  Suzanne Russo is a 85 y.o.  female with multiple medical problems including trigeminal neuralgia,abdominal pain,abnormal weight loss, anemia,  hyperlipidemia,hypothyroidism,osteoarthritis,osteoporosis. Mrs. Suzanne Russo was recently hospitalized 6/10-6/13/2020 s/p fall sustaining closed right hip fracture. She is currently receiving in home therapy. Palliative Care was asked to help address goals of care; she is seen for follow up visit today. Mrs. Suzanne Russo reports her pain being "moderate" when she is moving/transfering, but denies pain when she is at rest. She is receiving acetaminophen TID and has received norco x 3 times since discharging from hospital per Mr. Suzanne Russo. Husband reports having some difficulty getting patient to participate in some exercises. Patient did some walking today since returning home; she ambulated around 8 feet. She has caregivers in the home 24/7. Good appetite reported. She is sleeping well. Patient with agitation at times; she "sundowns" and was agitated yesterday evening per husband. She is receiving olanzapine 5mg  twice daily. She has a follow up visit with orthopedics tomorrow, 04/02/2019.   CODE STATUS: DNR  PPS: 40% HOSPICE ELIGIBILITY/DIAGNOSIS: TBD  PAST MEDICAL HISTORY:  Past Medical History:  Diagnosis Date  . Carpal tunnel syndrome   . Concussion with no loss of consciousness 08/26/2015  . Degenerative arthritis   . Dementia (HCC) 02/13/2019  . DVT (deep venous thrombosis) (HCC)   . GERD (gastroesophageal reflux disease)   . Hemorrhoids   . Hypothyroidism   . Memory difficulty 07/16/2014  . Occipital neuralgia   . Occipital neuralgia of left side 12/15/2014  . Osteoporosis   . Pelvic fracture (HCC)   . Peptic ulcer disease   . Postconcussive syndrome 12/20/2018  . Right clavicle fracture   . Trigeminal neuralgia   . Venous insufficiency     SOCIAL HX:  Social History   Tobacco Use  . Smoking status: Former Smoker  Packs/day: 1.00    Years: 45.00    Pack years: 45.00    Types: Cigarettes  . Smokeless tobacco: Never Used  Substance Use Topics  . Alcohol use: Yes    Alcohol/week: 7.0  standard drinks    Types: 7 Glasses of wine per week    Comment: wine on occasion    ALLERGIES:  Allergies  Allergen Reactions  . Aricept [Donepezil]     GI upset  . Citalopram Other (See Comments)    Caused bad dreams  . Sulfa Antibiotics Rash  . Tetracycline Rash     PERTINENT MEDICATIONS:  Outpatient Encounter Medications as of 04/01/2019  Medication Sig  . acetaminophen (TYLENOL) 500 MG tablet Take 1,000 mg by mouth every 8 (eight) hours.   . bisacodyl (DULCOLAX) 5 MG EC tablet Take 1 tablet (5 mg total) by mouth daily as needed for moderate constipation.  . Calcium Carbonate-Vit D-Min (CALTRATE 600+D PLUS MINERALS) 600-800 MG-UNIT TABS Take 1 tablet by mouth 2 (two) times daily.  . carbamazepine (CARBATROL) 200 MG 12 hr capsule Take 1 capsule (200 mg total) by mouth 2 (two) times daily.  . carbamazepine (CARBATROL) 300 MG 12 hr capsule TAKE 1 CAPSULE BY MOUTH TWICE DAILY  . Cyanocobalamin (B-12 PO) Take 2,500 mcg by mouth daily.   Marland Kitchen enoxaparin (LOVENOX) 40 MG/0.4ML injection Inject 0.4 mLs (40 mg total) into the skin daily for 14 days.  . hydrocortisone (ANUSOL-HC) 25 MG suppository Place 25 mg rectally 2 (two) times daily as needed for hemorrhoids or anal itching.   . levothyroxine (SYNTHROID, LEVOTHROID) 100 MCG tablet Take 100 mcg by mouth daily before breakfast.  . loratadine-pseudoephedrine (CLARITIN-D 24-HOUR) 10-240 MG 24 hr tablet Take 1 tablet by mouth daily.  . meclizine (ANTIVERT) 25 MG tablet Take 25 mg by mouth every 6 (six) hours as needed.  Marland Kitchen OLANZapine (ZYPREXA) 2.5 MG tablet 1 in the morning, 2 in the evening (Patient taking differently: Take 5 mg by mouth 2 (two) times a day. )  . omeprazole (PRILOSEC) 20 MG capsule Take 20 mg by mouth daily.  Marland Kitchen senna-docusate (SENOKOT-S) 8.6-50 MG tablet Take 1 tablet by mouth at bedtime as needed for mild constipation.  . triamcinolone cream (KENALOG) 0.1 % Apply 1 application topically 2 (two) times daily.   Marland Kitchen zolendronic  acid (ZOMETA) 4 MG/5ML injection Inject 4 mg into the vein once. Last received inj 11/07/17   No facility-administered encounter medications on file as of 04/01/2019.     PHYSICAL EXAM:   General: NAD, frail appearing, thin Cardiovascular: regular rate and rhythm Pulmonary: clear ant fields Abdomen: soft, nontender, + bowel sounds GU: no suprapubic tenderness Extremities: trace edema Neurological: Weakness but otherwise nonfocal  Ezekiel Slocumb, NP

## 2019-04-08 ENCOUNTER — Other Ambulatory Visit: Payer: Self-pay | Admitting: Neurology

## 2019-04-08 MED ORDER — OLANZAPINE 5 MG PO TABS: 5.0000 mg | ORAL_TABLET | Freq: Two times a day (BID) | ORAL | 3 refills | Status: DC

## 2019-05-13 ENCOUNTER — Other Ambulatory Visit: Payer: Self-pay | Admitting: Neurology

## 2019-05-13 MED ORDER — OLANZAPINE 2.5 MG PO TABS: 2.5000 mg | ORAL_TABLET | Freq: Every evening | ORAL | 3 refills | Status: DC | PRN

## 2019-06-05 ENCOUNTER — Telehealth: Payer: Self-pay

## 2019-06-05 NOTE — Telephone Encounter (Signed)
Received message to call patient's husband, [name]. Phone call placed to [name] who reported that patient appears to be dehydrated. HR elevated at 104, apathetic and lethargic. Patient will c/o chest hurting. Spoke with Palliative NP who directed for [name] to reach out to PCP or to call EMS. Spoke with [name] and made him aware of this. Discussed with [name] what goals of care is for this patient and offered to schedule visit if decided to pursue comfort and not aggressive treatment.  [name] stated that he will contact PCP and return call to Palliative

## 2019-06-06 ENCOUNTER — Other Ambulatory Visit: Payer: Self-pay

## 2019-06-06 ENCOUNTER — Emergency Department: Payer: Medicare Other

## 2019-06-06 ENCOUNTER — Encounter: Payer: Self-pay | Admitting: Emergency Medicine

## 2019-06-06 ENCOUNTER — Inpatient Hospital Stay
Admission: EM | Admit: 2019-06-06 | Discharge: 2019-06-08 | DRG: 309 | Disposition: A | Discharge status: Home Health | Payer: Medicare Other | Attending: Internal Medicine | Admitting: Internal Medicine

## 2019-06-06 DIAGNOSIS — I480 Paroxysmal atrial fibrillation: Principal | ICD-10-CM | POA: Diagnosis present

## 2019-06-06 DIAGNOSIS — X58XXXA Exposure to other specified factors, initial encounter: Secondary | ICD-10-CM | POA: Diagnosis present

## 2019-06-06 DIAGNOSIS — I248 Other forms of acute ischemic heart disease: Secondary | ICD-10-CM | POA: Diagnosis present

## 2019-06-06 DIAGNOSIS — Z8711 Personal history of peptic ulcer disease: Secondary | ICD-10-CM

## 2019-06-06 DIAGNOSIS — I119 Hypertensive heart disease without heart failure: Secondary | ICD-10-CM | POA: Diagnosis present

## 2019-06-06 DIAGNOSIS — R531 Weakness: Secondary | ICD-10-CM | POA: Diagnosis present

## 2019-06-06 DIAGNOSIS — I739 Peripheral vascular disease, unspecified: Secondary | ICD-10-CM | POA: Diagnosis present

## 2019-06-06 DIAGNOSIS — D638 Anemia in other chronic diseases classified elsewhere: Secondary | ICD-10-CM | POA: Diagnosis present

## 2019-06-06 DIAGNOSIS — I4510 Unspecified right bundle-branch block: Secondary | ICD-10-CM | POA: Diagnosis present

## 2019-06-06 DIAGNOSIS — Z79890 Hormone replacement therapy (postmenopausal): Secondary | ICD-10-CM | POA: Diagnosis not present

## 2019-06-06 DIAGNOSIS — K219 Gastro-esophageal reflux disease without esophagitis: Secondary | ICD-10-CM | POA: Diagnosis present

## 2019-06-06 DIAGNOSIS — M5481 Occipital neuralgia: Secondary | ICD-10-CM | POA: Diagnosis present

## 2019-06-06 DIAGNOSIS — Z7983 Long term (current) use of bisphosphonates: Secondary | ICD-10-CM

## 2019-06-06 DIAGNOSIS — R296 Repeated falls: Secondary | ICD-10-CM | POA: Diagnosis present

## 2019-06-06 DIAGNOSIS — F05 Delirium due to known physiological condition: Secondary | ICD-10-CM | POA: Diagnosis present

## 2019-06-06 DIAGNOSIS — Z888 Allergy status to other drugs, medicaments and biological substances status: Secondary | ICD-10-CM

## 2019-06-06 DIAGNOSIS — Z825 Family history of asthma and other chronic lower respiratory diseases: Secondary | ICD-10-CM

## 2019-06-06 DIAGNOSIS — I4891 Unspecified atrial fibrillation: Secondary | ICD-10-CM | POA: Diagnosis present

## 2019-06-06 DIAGNOSIS — F0390 Unspecified dementia without behavioral disturbance: Secondary | ICD-10-CM | POA: Diagnosis present

## 2019-06-06 DIAGNOSIS — Z87891 Personal history of nicotine dependence: Secondary | ICD-10-CM | POA: Diagnosis not present

## 2019-06-06 DIAGNOSIS — E039 Hypothyroidism, unspecified: Secondary | ICD-10-CM | POA: Diagnosis present

## 2019-06-06 DIAGNOSIS — S2222XA Fracture of body of sternum, initial encounter for closed fracture: Secondary | ICD-10-CM | POA: Diagnosis present

## 2019-06-06 DIAGNOSIS — Z66 Do not resuscitate: Secondary | ICD-10-CM | POA: Diagnosis present

## 2019-06-06 DIAGNOSIS — M810 Age-related osteoporosis without current pathological fracture: Secondary | ICD-10-CM | POA: Diagnosis present

## 2019-06-06 DIAGNOSIS — Z20828 Contact with and (suspected) exposure to other viral communicable diseases: Secondary | ICD-10-CM | POA: Diagnosis present

## 2019-06-06 DIAGNOSIS — Z882 Allergy status to sulfonamides status: Secondary | ICD-10-CM | POA: Diagnosis not present

## 2019-06-06 DIAGNOSIS — R0789 Other chest pain: Secondary | ICD-10-CM

## 2019-06-06 DIAGNOSIS — Z9181 History of falling: Secondary | ICD-10-CM

## 2019-06-06 DIAGNOSIS — Z79899 Other long term (current) drug therapy: Secondary | ICD-10-CM | POA: Diagnosis not present

## 2019-06-06 DIAGNOSIS — G500 Trigeminal neuralgia: Secondary | ICD-10-CM | POA: Diagnosis present

## 2019-06-06 DIAGNOSIS — Z8249 Family history of ischemic heart disease and other diseases of the circulatory system: Secondary | ICD-10-CM

## 2019-06-06 DIAGNOSIS — Z86718 Personal history of other venous thrombosis and embolism: Secondary | ICD-10-CM

## 2019-06-06 LAB — BASIC METABOLIC PANEL
Anion gap: 8 (ref 5–15)
BUN: 20 mg/dL (ref 8–23)
CO2: 27 mmol/L (ref 22–32)
Calcium: 8.8 mg/dL — ABNORMAL LOW (ref 8.9–10.3)
Chloride: 102 mmol/L (ref 98–111)
Creatinine, Ser: 0.44 mg/dL (ref 0.44–1.00)
GFR calc Af Amer: >60 mL/min (ref >60)
GFR calc non Af Amer: >60 mL/min (ref >60)
Glucose, Bld: 105 mg/dL — ABNORMAL HIGH (ref 70–99)
Potassium: 4.3 mmol/L (ref 3.5–5.1)
Sodium: 137 mmol/L (ref 135–145)

## 2019-06-06 LAB — SARS CORONAVIRUS 2 BY RT PCR (HOSPITAL ORDER, PERFORMED IN ~~LOC~~ HOSPITAL LAB): SARS Coronavirus 2: NEGATIVE

## 2019-06-06 LAB — CBC
HCT: 31 % — ABNORMAL LOW (ref 36.0–46.0)
Hemoglobin: 9.8 g/dL — ABNORMAL LOW (ref 12.0–15.0)
MCH: 29 pg (ref 26.0–34.0)
MCHC: 31.6 g/dL (ref 30.0–36.0)
MCV: 91.7 fL (ref 80.0–100.0)
Platelets: 304 10*3/uL (ref 150–400)
RBC: 3.38 MIL/uL — ABNORMAL LOW (ref 3.87–5.11)
RDW: 13.2 % (ref 11.5–15.5)
WBC: 10.4 10*3/uL (ref 4.0–10.5)
nRBC: 0 % (ref 0.0–0.2)

## 2019-06-06 LAB — TROPONIN I (HIGH SENSITIVITY)
Troponin I (High Sensitivity): 16 ng/L (ref <18)
Troponin I (High Sensitivity): 15 ng/L (ref <18)

## 2019-06-06 LAB — HEPATIC FUNCTION PANEL
ALT: 16 U/L (ref 0–44)
AST: 18 U/L (ref 15–41)
Albumin: 3.7 g/dL (ref 3.5–5.0)
Alkaline Phosphatase: 83 U/L (ref 38–126)
Bilirubin, Direct: <0.1 mg/dL (ref 0.0–0.2)
Total Bilirubin: 0.2 mg/dL — ABNORMAL LOW (ref 0.3–1.2)
Total Protein: 7.1 g/dL (ref 6.5–8.1)

## 2019-06-06 LAB — MAGNESIUM: Magnesium: 2.4 mg/dL (ref 1.7–2.4)

## 2019-06-06 LAB — BRAIN NATRIURETIC PEPTIDE: B Natriuretic Peptide: 322 pg/mL — ABNORMAL HIGH (ref 0.0–100.0)

## 2019-06-06 MED ORDER — CALCIUM CARBONATE-VITAMIN D 500-200 MG-UNIT PO TABS
1.0000 | ORAL_TABLET | Freq: Two times a day (BID) | ORAL | Status: DC
  Administered 2019-06-06 – 2019-06-08 (×4): 1 via ORAL
  Filled 2019-06-06 (×4): qty 1

## 2019-06-06 MED ORDER — ACETAMINOPHEN 500 MG PO TABS
1000.0000 mg | ORAL_TABLET | Freq: Once | ORAL | Status: AC
  Administered 2019-06-06: 16:00:00 1000 mg via ORAL
  Filled 2019-06-06: qty 2

## 2019-06-06 MED ORDER — ONDANSETRON HCL 4 MG/2ML IJ SOLN: 4.0000 mg | Freq: Four times a day (QID) | INTRAMUSCULAR | Status: DC | PRN

## 2019-06-06 MED ORDER — SENNOSIDES-DOCUSATE SODIUM 8.6-50 MG PO TABS: 1.0000 | ORAL_TABLET | Freq: Every evening | ORAL | Status: DC | PRN

## 2019-06-06 MED ORDER — LEVOTHYROXINE SODIUM 50 MCG PO TABS
100.0000 ug | ORAL_TABLET | Freq: Every day | ORAL | Status: DC
  Administered 2019-06-07 – 2019-06-08 (×2): 100 ug via ORAL
  Filled 2019-06-06 (×2): qty 2

## 2019-06-06 MED ORDER — HYDROCORTISONE ACETATE 25 MG RE SUPP
25.0000 mg | Freq: Two times a day (BID) | RECTAL | Status: DC | PRN
  Filled 2019-06-06: qty 1

## 2019-06-06 MED ORDER — OLANZAPINE 5 MG PO TABS
5.0000 mg | ORAL_TABLET | Freq: Two times a day (BID) | ORAL | Status: DC
  Administered 2019-06-06 – 2019-06-08 (×4): 5 mg via ORAL
  Filled 2019-06-06 (×6): qty 1

## 2019-06-06 MED ORDER — ONDANSETRON HCL 4 MG PO TABS: 4.0000 mg | ORAL_TABLET | Freq: Four times a day (QID) | ORAL | Status: DC | PRN

## 2019-06-06 MED ORDER — BISACODYL 5 MG PO TBEC: 5.0000 mg | DELAYED_RELEASE_TABLET | Freq: Every day | ORAL | Status: DC | PRN

## 2019-06-06 MED ORDER — OXYCODONE HCL 5 MG PO TABS
2.5000 mg | ORAL_TABLET | Freq: Once | ORAL | Status: AC
  Administered 2019-06-06: 16:00:00 2.5 mg via ORAL
  Filled 2019-06-06: qty 1

## 2019-06-06 MED ORDER — MECLIZINE HCL 25 MG PO TABS
25.0000 mg | ORAL_TABLET | Freq: Four times a day (QID) | ORAL | Status: DC | PRN
  Filled 2019-06-06: qty 1

## 2019-06-06 MED ORDER — SODIUM CHLORIDE 0.9% FLUSH: 3.0000 mL | INTRAVENOUS | Status: DC | PRN

## 2019-06-06 MED ORDER — PANTOPRAZOLE SODIUM 40 MG PO TBEC
40.0000 mg | DELAYED_RELEASE_TABLET | Freq: Every day | ORAL | Status: DC
  Administered 2019-06-07 – 2019-06-08 (×2): 40 mg via ORAL
  Filled 2019-06-06 (×2): qty 1

## 2019-06-06 MED ORDER — CARBAMAZEPINE ER 100 MG PO TB12
100.0000 mg | ORAL_TABLET | Freq: Two times a day (BID) | ORAL | Status: DC
  Administered 2019-06-06 – 2019-06-08 (×4): 100 mg via ORAL
  Filled 2019-06-06 (×5): qty 1

## 2019-06-06 MED ORDER — LORATADINE-PSEUDOEPHEDRINE ER 10-240 MG PO TB24: 1.0000 | ORAL_TABLET | Freq: Every day | ORAL | Status: DC

## 2019-06-06 MED ORDER — CARBAMAZEPINE ER 200 MG PO TB12: 200.0000 mg | ORAL_TABLET | Freq: Two times a day (BID) | ORAL | Status: DC

## 2019-06-06 MED ORDER — DILTIAZEM HCL 25 MG/5ML IV SOLN
10.0000 mg | INTRAVENOUS | Status: AC
  Administered 2019-06-06: 18:00:00 10 mg via INTRAVENOUS

## 2019-06-06 MED ORDER — CARBAMAZEPINE ER 300 MG PO CP12: 300.0000 mg | ORAL_CAPSULE | Freq: Two times a day (BID) | ORAL | Status: DC

## 2019-06-06 MED ORDER — ALBUTEROL SULFATE (2.5 MG/3ML) 0.083% IN NEBU: 2.5000 mg | INHALATION_SOLUTION | RESPIRATORY_TRACT | Status: DC | PRN

## 2019-06-06 MED ORDER — ACETAMINOPHEN 325 MG PO TABS: 650.0000 mg | ORAL_TABLET | Freq: Four times a day (QID) | ORAL | Status: DC | PRN

## 2019-06-06 MED ORDER — SODIUM CHLORIDE 0.9% FLUSH: 3.0000 mL | Freq: Once | INTRAVENOUS | Status: DC

## 2019-06-06 MED ORDER — IOHEXOL 350 MG/ML SOLN
75.0000 mL | Freq: Once | INTRAVENOUS | Status: AC | PRN
  Administered 2019-06-06: 15:00:00 75 mL via INTRAVENOUS

## 2019-06-06 MED ORDER — CARBAMAZEPINE ER 100 MG PO TB12: 100.0000 mg | ORAL_TABLET | Freq: Two times a day (BID) | ORAL | Status: DC

## 2019-06-06 MED ORDER — ACETAMINOPHEN 500 MG PO TABS
1000.0000 mg | ORAL_TABLET | Freq: Three times a day (TID) | ORAL | Status: DC
  Administered 2019-06-06 – 2019-06-08 (×6): 1000 mg via ORAL
  Filled 2019-06-06 (×6): qty 2

## 2019-06-06 MED ORDER — SODIUM CHLORIDE 0.9% FLUSH
3.0000 mL | Freq: Two times a day (BID) | INTRAVENOUS | Status: DC
  Administered 2019-06-06 – 2019-06-07 (×3): 3 mL via INTRAVENOUS

## 2019-06-06 MED ORDER — DILTIAZEM HCL 25 MG/5ML IV SOLN
INTRAVENOUS | Status: AC
  Administered 2019-06-06: 18:00:00 10 mg via INTRAVENOUS
  Filled 2019-06-06: qty 5

## 2019-06-06 MED ORDER — PSEUDOEPHEDRINE HCL ER 120 MG PO TB12
120.0000 mg | ORAL_TABLET | Freq: Two times a day (BID) | ORAL | Status: DC
  Administered 2019-06-07 – 2019-06-08 (×2): 120 mg via ORAL
  Filled 2019-06-06 (×5): qty 1

## 2019-06-06 MED ORDER — SODIUM CHLORIDE 0.9 % IV SOLN: 250.0000 mL | INTRAVENOUS | Status: DC | PRN

## 2019-06-06 MED ORDER — ACETAMINOPHEN 650 MG RE SUPP: 650.0000 mg | Freq: Four times a day (QID) | RECTAL | Status: DC | PRN

## 2019-06-06 MED ORDER — ENOXAPARIN SODIUM 40 MG/0.4ML ~~LOC~~ SOLN
40.0000 mg | SUBCUTANEOUS | Status: DC
  Administered 2019-06-06 – 2019-06-07 (×2): 40 mg via SUBCUTANEOUS
  Filled 2019-06-06 (×2): qty 0.4

## 2019-06-06 MED ORDER — HYDROCODONE-ACETAMINOPHEN 5-325 MG PO TABS: 1.0000 | ORAL_TABLET | ORAL | Status: DC | PRN

## 2019-06-06 MED ORDER — CARBAMAZEPINE ER 200 MG PO TB12
400.0000 mg | ORAL_TABLET | Freq: Two times a day (BID) | ORAL | Status: DC
  Administered 2019-06-06 – 2019-06-08 (×4): 400 mg via ORAL
  Filled 2019-06-06 (×5): qty 2

## 2019-06-06 MED ORDER — LORATADINE 10 MG PO TABS
10.0000 mg | ORAL_TABLET | Freq: Every day | ORAL | Status: DC
  Administered 2019-06-07 – 2019-06-08 (×2): 10 mg via ORAL
  Filled 2019-06-06 (×2): qty 1

## 2019-06-06 MED ORDER — METOPROLOL TARTRATE 25 MG PO TABS
12.5000 mg | ORAL_TABLET | Freq: Two times a day (BID) | ORAL | Status: DC
  Administered 2019-06-06 – 2019-06-07 (×3): 12.5 mg via ORAL
  Filled 2019-06-06 (×4): qty 1

## 2019-06-06 MED ORDER — CARBAMAZEPINE ER 200 MG PO TB12
200.0000 mg | ORAL_TABLET | Freq: Two times a day (BID) | ORAL | Status: DC
  Filled 2019-06-06: qty 1

## 2019-06-06 NOTE — ED Notes (Signed)
2nd trop sent to lab

## 2019-06-06 NOTE — ED Notes (Signed)
Admitting Provider at bedside talking to family

## 2019-06-06 NOTE — ED Notes (Signed)
ED TO INPATIENT HANDOFF REPORT  ED Nurse Name and Phone #: Chrissy, [phone]  S Name/Age/Gender Suzanne Russo 85 y.o. female Room/Bed: ED05A/ED05A  Code Status   Code Status: Prior  Home/SNF/Other Home Patient oriented to: self, place, time and situation Is this baseline? Yes   Triage Complete: Triage complete  Chief Complaint cp/brought by kc  Triage Note Patient complaining of CP, SOB and increased weakness for the past two days. Family reports rapid, shallow breathing with exertion.    Allergies Allergies  Allergen Reactions  . Aricept [Donepezil]     GI upset  . Citalopram Other (See Comments)    Caused bad dreams  . Sulfa Antibiotics Rash  . Tetracycline Rash    Level of Care/Admitting Diagnosis ED Disposition    ED Disposition Condition Comment   Admit  Hospital Area: [facility] [[id number]]  Level of Care: Telemetry [5]  Covid Evaluation: Person Under Investigation (PUI)  Diagnosis: Atrial fibrillation with rapid ventricular response [facility]) [[id number]]  Admitting Physician: Shaune Pollack [[id number]]  Attending Physician: Shaune Pollack [[id number]]  Estimated length of stay: past midnight tomorrow  Certification:: I certify this patient will need inpatient services for at least 2 midnights  PT Class (Do Not Modify): Inpatient [101]  PT Acc Code (Do Not Modify): Private [1]       B Medical/Surgery History Past Medical History:  Diagnosis Date  . Carpal tunnel syndrome   . Concussion with no loss of consciousness 08/26/2015  . Degenerative arthritis   . Dementia (HCC) 02/13/2019  . DVT (deep venous thrombosis) (HCC)   . GERD (gastroesophageal reflux disease)   . Hemorrhoids   . Hypothyroidism   . Memory difficulty 07/16/2014  . Occipital neuralgia   . Occipital neuralgia of left side 12/15/2014  . Osteoporosis   . Pelvic fracture (HCC)   . Peptic ulcer disease   . Postconcussive syndrome 12/20/2018  . Right clavicle fracture   . Trigeminal  neuralgia   . Venous insufficiency    Past Surgical History:  Procedure Laterality Date  . bilateral cataract surgery    . bilateral knee surgery    . INTRAMEDULLARY (IM) NAIL INTERTROCHANTERIC Right 03/19/2019   Procedure: INTRAMEDULLARY (IM) NAIL INTERTROCHANTRIC;  Surgeon: Christena Flake, MD;  Location: [facility] ORS;  Service: Orthopedics;  Laterality: Right;  . left hip fracture    . left thumb surgery    . left toe surgery    . NASAL SINUS SURGERY    . TONSILLECTOMY    . VAGINAL HYSTERECTOMY       A IV Location/Drains/Wounds Patient Lines/Drains/Airways Status   Active Line/Drains/Airways    Name:   Placement date:   Placement time:   Site:   Days:   Peripheral IV 06/06/19 Right Forearm   06/06/19    1330    Forearm   less than 1   Incision (Closed) 03/19/19 Hip   03/19/19    2201     79   Wound / Incision (Open or Dehisced) 03/18/17 Venous stasis ulcer Foot Left;Medial   03/18/17    1400    Foot   810          Intake/Output Last 24 hours No intake or output data in the 24 hours ending 06/06/19 1748  Labs/Imaging Results for orders placed or performed during the hospital encounter of 06/06/19 (from the past 48 hour(s))  Basic metabolic panel     Status: Abnormal   Collection Time: 06/06/19 10:31 AM  Result  Value Ref Range   Sodium 137 135 - 145 mmol/L   Potassium 4.3 3.5 - 5.1 mmol/L   Chloride 102 98 - 111 mmol/L   CO2 27 22 - 32 mmol/L   Glucose, Bld 105 (H) 70 - 99 mg/dL   BUN 20 8 - 23 mg/dL   Creatinine, Ser 0.44 0.44 - 1.00 mg/dL   Calcium 8.8 (L) 8.9 - 10.3 mg/dL   GFR calc non Af Amer >60 >60 mL/min   GFR calc Af Amer >60 >60 mL/min   Anion gap 8 5 - 15    Comment: Performed at [facility], [address] [place], [place] [address]  CBC     Status: Abnormal   Collection Time: 06/06/19 10:31 AM  Result Value Ref Range   WBC 10.4 4.0 - 10.5 K/uL   RBC 3.38 (L) 3.87 - 5.11 MIL/uL   Hemoglobin 9.8 (L) 12.0 - 15.0 g/dL   HCT 31.0 (L) 36.0 - 46.0 %    MCV 91.7 80.0 - 100.0 fL   MCH 29.0 26.0 - 34.0 pg   MCHC 31.6 30.0 - 36.0 g/dL   RDW 13.2 11.5 - 15.5 %   Platelets 304 150 - 400 K/uL   nRBC 0.0 0.0 - 0.2 %    Comment: Performed at [facility], [place], [place] [address]  Troponin I (High Sensitivity)     Status: None   Collection Time: 06/06/19 10:31 AM  Result Value Ref Range   Troponin I (High Sensitivity) 16 <18 ng/L    Comment: (NOTE) Elevated high sensitivity troponin I (hsTnI) values and significant  changes across serial measurements may suggest ACS but many other  chronic and acute conditions are known to elevate hsTnI results.  Refer to the "Links" section for chest pain algorithms and additional  guidance. Performed at [facility], [address] [place], [place] [address]   Brain natriuretic peptide     Status: Abnormal   Collection Time: 06/06/19 10:31 AM  Result Value Ref Range   B Natriuretic Peptide 322.0 (H) 0.0 - 100.0 pg/mL    Comment: Performed at [facility], [place], [place] [address]  Troponin I (High Sensitivity)     Status: None   Collection Time: 06/06/19  1:30 PM  Result Value Ref Range   Troponin I (High Sensitivity) 15 <18 ng/L    Comment: (NOTE) Elevated high sensitivity troponin I (hsTnI) values and significant  changes across serial measurements may suggest ACS but many other  chronic and acute conditions are known to elevate hsTnI results.  Refer to the "Links" section for chest pain algorithms and additional  guidance. Performed at [facility], [address] [place], [place] [address]   Hepatic function panel     Status: Abnormal   Collection Time: 06/06/19  1:30 PM  Result Value Ref Range   Total Protein 7.1 6.5 - 8.1 g/dL   Albumin 3.7 3.5 - 5.0 g/dL   AST 18 15 - 41 U/L   ALT 16 0 - 44 U/L   Alkaline Phosphatase 83 38 - 126 U/L   Total Bilirubin 0.2 (L) 0.3 - 1.2 mg/dL   Bilirubin, Direct <0.1 0.0 -  0.2 mg/dL   Indirect Bilirubin NOT CALCULATED 0.3 - 0.9 mg/dL    Comment: Performed at [facility], [address], [place], [place] [address]   Dg Chest 2 View  Result Date: 06/06/2019 CLINICAL DATA:  Chest pain  EXAM: CHEST - 2 VIEW COMPARISON:  03/19/2019, 02/10/2019 FINDINGS: The heart size and mediastinal contours are stable. Calcific aortic knob. Chronic coarsened interstitial markings bilaterally. No focal airspace consolidation. No pleural effusion or pneumothorax. Multiple chronic right-sided rib fractures. Redemonstration of deformity of the left humeral head and neck. IMPRESSION: No active cardiopulmonary disease. Electronically Signed   By: Duanne GuessNicholas  Plundo M.D.   On: 06/06/2019 11:17   Ct Angio Chest Pe W And/or Wo Contrast  Result Date: 06/06/2019 CLINICAL DATA:  Chest pain EXAM: CT ANGIOGRAPHY CHEST WITH CONTRAST TECHNIQUE: Multidetector CT imaging of the chest was performed using the standard protocol during bolus administration of intravenous contrast. Multiplanar CT image reconstructions and MIPs were obtained to evaluate the vascular anatomy. CONTRAST:  75mL OMNIPAQUE IOHEXOL 350 MG/ML SOLN COMPARISON:  03/18/2017 FINDINGS: Cardiovascular: Heart size is upper limits of normal. No pericardial effusion. Satisfactory opacification of the pulmonary arteries. No filling defects to the segmental branch level no evidence of pulmonary embolism. Mild atherosclerotic calcifications of the aorta and coronary arteries. Thoracic aorta is tortuous. Mediastinum/Nodes: No enlarged mediastinal, hilar, or axillary lymph nodes. Thyroid gland, trachea, and esophagus demonstrate no significant findings. Lungs/Pleura: No focal airspace consolidation, pleural effusion, or pneumothorax. Mild bibasilar linear scarring/atelectasis. Upper Abdomen: No acute abnormality. Musculoskeletal: Minimally displaced fracture of the sternal body superiorly with associated swelling of the overlying chest wall (series  8, image 43). There is a healed fracture more inferiorly of the sternum and an additional healed manubrial fracture. Multiple healed bilateral rib fractures. Levocurvature of the upper thoracic spine. Degenerative changes of the visualized lower cervical spine. Review of the MIP images confirms the above findings. IMPRESSION: 1. Negative for pulmonary embolism. 2. Acute, minimally displaced fracture of the sternal body. 3. Lungs are clear. Electronically Signed   By: Duanne GuessNicholas  Plundo M.D.   On: 06/06/2019 15:25   Ct Cervical Spine Wo Contrast  Result Date: 06/06/2019 CLINICAL DATA:  C-spine stenosis. Patient reports right-sided shoulder, chest, and upper back/neck pain. EXAM: CT CERVICAL SPINE WITHOUT CONTRAST TECHNIQUE: Multidetector CT imaging of the cervical spine was performed without intravenous contrast. Multiplanar CT image reconstructions were also generated. COMPARISON:  CT of the cervical spine 12/06/2018 FINDINGS: Alignment: There is straightening of the normal cervical lordosis. Grade 1 anterolisthesis at C5-6 measures 5 mm. Grade 1 anterolisthesis at C6-7 measures 2.5 mm. There is slight anterolisthesis at T1-2 greater than T2-3. Mild rightward curvature is present in the cervical spine. This compensates for leftward curvature in the upper thoracic spine. Skull base and vertebrae: Advanced degenerative changes are present at C1-2 without significant change. Left greater than right lateral mass articulation degenerative changes are noted. There is chronic loss of height at C6 and along the superior endplate of C7. There is chronic loss of height along the inferior endplate of C5. There is no significant change. Vertebral body heights are otherwise maintained. Soft tissues and spinal canal: Atherosclerotic calcifications are again noted at the left carotid bifurcation. Soft tissues the neck are otherwise unremarkable. Disc levels: Advanced facet hypertrophy and uncovertebral changes contribute to  foraminal narrowing at C5-6 and C6-7, left greater than right. This is similar the prior exam. No new stenosis or significant disc disease is present. Upper chest: Mild dependent atelectasis is present. Thoracic inlet is normal. There is some scarring at the lung apices bilaterally. IMPRESSION: 1. Similar appearance of advanced spondylosis in the cervical spine. 2. Advanced degenerative changes at C1-2 with left greater than right lateral mass articular disease contributing to left foraminal  stenosis. 3. Grade 1 anterolisthesis at C5-6 and C6-7 advanced facet hypertrophy leads to left greater than right foraminal stenosis at both levels. 4. Chronic loss of vertebral body height at C6 associated with the endplate degenerative changes. 5. No acute abnormalities. Electronically Signed   By: Marin Robertshristopher  Mattern M.D.   On: 06/06/2019 15:29    Pending Labs Unresulted Labs (From admission, onward)    Start     Ordered   06/06/19 1644  SARS Coronavirus 2 [facility](Hospital order, Performed in [facility] hospital lab) Nasopharyngeal Nasopharyngeal Swab  (Symptomatic/High Risk of Exposure/Tier 1 Patients Labs with Precautions)  Once,   STAT    Question Answer Comment  Is this test for diagnosis or screening Diagnosis of ill patient   Symptomatic for COVID-19 as defined by CDC Yes   Date of Symptom Onset 06/06/2019   Hospitalized for COVID-19 No   Admitted to ICU for COVID-19 No   Previously tested for COVID-19 No   Resident in a congregate (group) care setting No   Employed in healthcare setting No   Pregnant No      06/06/19 1643   Signed and Held  Basic metabolic panel  Tomorrow morning,   R     Signed and Held   Signed and Held  CBC  Tomorrow morning,   R     Signed and Held   Signed and Held  Creatinine, serum  (enoxaparin (LOVENOX)    CrCl >/= 30 ml/min)  Weekly,   R    Comments: while on enoxaparin therapy    Signed and Held   Signed and Held  Magnesium  Add-on,   R     Signed and Held           Vitals/Pain Today's Vitals   06/06/19 1741 06/06/19 1742 06/06/19 1743 06/06/19 1744  BP:      Pulse: (!) 123 97 97 97  Resp: 18 16 16 15   Temp:      TempSrc:      SpO2: 96% 96% 97% 96%  Weight:      Height:      PainSc:        Isolation Precautions No active isolations  Medications Medications  sodium chloride flush (NS) 0.9 % injection 3 mL (has no administration in time range)  iohexol (OMNIPAQUE) 350 MG/ML injection 75 mL (75 mLs Intravenous Contrast Given 06/06/19 1457)  acetaminophen (TYLENOL) tablet 1,000 mg (1,000 mg Oral Given 06/06/19 1605)  oxyCODONE (Oxy IR/ROXICODONE) immediate release tablet 2.5 mg (2.5 mg Oral Given 06/06/19 1605)  diltiazem (CARDIZEM) injection 10 mg (10 mg Intravenous Given 06/06/19 1739)    Mobility walks with person assist High fall risk   Focused Assessments Cardiac Assessment Handoff:    Lab Results  Component Value Date   CKTOTAL 51 12/06/2018   TROPONINI <0.03 02/10/2019   No results found for: DDIMER Does the Patient currently have chest pain? no    R Recommendations: See Admitting Provider Note  Report given to:   Additional Notes:

## 2019-06-06 NOTE — ED Notes (Signed)
Patient transported to CT 

## 2019-06-06 NOTE — ED Notes (Signed)
Call for report, Remo Lipps, sec reports primary RN in code will call this RN when out

## 2019-06-06 NOTE — ED Notes (Signed)
Pt oob to br with 2 person assist. 

## 2019-06-06 NOTE — ED Triage Notes (Signed)
Patient complaining of CP, SOB and increased weakness for the past two days. Family reports rapid, shallow breathing with exertion.

## 2019-06-06 NOTE — ED Provider Notes (Signed)
Repeat EKG: Interpreted by me, atrial fibrillation with a rapid ventricular response, rate is 124 bpm, right bundle branch block, long QT   Earleen Newport, MD 06/06/19 1715

## 2019-06-06 NOTE — ED Notes (Signed)
Pt given more warm blankets. Rails up. Bed locked low. Call bell within reach.

## 2019-06-06 NOTE — ED Notes (Signed)
Second call primary on phone to [facility] -- holding

## 2019-06-06 NOTE — ED Notes (Signed)
Dr. Bridgett Larsson called and told us the pt was in RVR at 140 and that he ordered cardizem. Gave pt the cardizem and watched HR. VSS with HR at 88 at this moment.

## 2019-06-06 NOTE — ED Notes (Signed)
Pt states having right shoulder, chest, and upper back/neck pain. Pt's pulses are strong. Pt states she vomited a few days ago, but hasn't felt nauseated since. No fevers.

## 2019-06-06 NOTE — ED Provider Notes (Signed)
[facility] Emergency Department Provider Note  ____________________________________________   First MD Initiated Contact with Patient 06/06/19 1343     (approximate)  I have reviewed the triage vital signs and the nursing notes.   HISTORY  Chief Complaint Chest Pain and Shortness of Breath    HPI Suzanne Russo is a 85 y.o. female with past medical history as below here with reported chest pain.  History is somewhat limited due to dementia.  Per report from family, the patient has been complaining of increasing chest pain over the last week.  They do not recall any falls, but patient does have history of recurrent falls.  She is also "very fragile" and has known osteoarthritis and osteoporosis.  She has seemed to be more short of breath over the last several days.  Her appetite has been poor.  She has been more weak than usual.  She been slightly more confused, but this is not necessarily unusual for her.  No recent medication changes.  Level 5 caveat invoked as remainder of history, ROS, and physical exam limited due to patient's AMS, dementia.        Past Medical History:  Diagnosis Date  . Carpal tunnel syndrome   . Concussion with no loss of consciousness 08/26/2015  . Degenerative arthritis   . Dementia (HCC) 02/13/2019  . DVT (deep venous thrombosis) (HCC)   . GERD (gastroesophageal reflux disease)   . Hemorrhoids   . Hypothyroidism   . Memory difficulty 07/16/2014  . Occipital neuralgia   . Occipital neuralgia of left side 12/15/2014  . Osteoporosis   . Pelvic fracture (HCC)   . Peptic ulcer disease   . Postconcussive syndrome 12/20/2018  . Right clavicle fracture   . Trigeminal neuralgia   . Venous insufficiency     Patient Active Problem List   Diagnosis Date Noted  . Hip fx (HCC) 03/19/2019  . Dementia (HCC) 02/13/2019  . Aggression 02/10/2019  . Postconcussive syndrome 12/20/2018  . Closed fracture of body of sternum 03/20/2017  .  Anemia 03/20/2017  . Generalized weakness 03/20/2017  . Pleuritic chest pain 03/20/2017  . Hypothyroidism 03/20/2017  . Rib fractures 03/18/2017  . Chronic venous insufficiency 03/14/2017  . Ankle ulcer, left, limited to breakdown of skin (HCC) 01/03/2017  . Cellulitis of lower extremity 11/14/2016  . Swelling of limb 11/14/2016  . Lymphedema 11/14/2016  . Headache disorder 08/26/2015  . Concussion with no loss of consciousness 08/26/2015  . Occipital neuralgia of left side 12/15/2014  . Memory difficulty 07/16/2014  . Trigeminal neuralgia 07/11/2013    Past Surgical History:  Procedure Laterality Date  . bilateral cataract surgery    . bilateral knee surgery    . INTRAMEDULLARY (IM) NAIL INTERTROCHANTERIC Right 03/19/2019   Procedure: INTRAMEDULLARY (IM) NAIL INTERTROCHANTRIC;  Surgeon: Christena Flake, MD;  Location: [facility] ORS;  Service: Orthopedics;  Laterality: Right;  . left hip fracture    . left thumb surgery    . left toe surgery    . NASAL SINUS SURGERY    . TONSILLECTOMY    . VAGINAL HYSTERECTOMY      Prior to Admission medications   Medication Sig Start Date End Date Taking? Authorizing Provider  acetaminophen (TYLENOL) 500 MG tablet Take 1,000 mg by mouth every 8 (eight) hours.     [provider]  bisacodyl (DULCOLAX) 5 MG EC tablet Take 1 tablet (5 mg total) by mouth daily as needed for moderate constipation. 03/22/19  Shaune Pollackhen, Qing, MD  Calcium Carbonate-Vit D-Min (CALTRATE 600+D PLUS MINERALS) 600-800 MG-UNIT TABS Take 1 tablet by mouth 2 (two) times daily.    [provider]  carbamazepine (CARBATROL) 200 MG 12 hr capsule Take 1 capsule (200 mg total) by mouth 2 (two) times daily. 03/04/19   [place] SpanielWillis, Charles K, MD  carbamazepine (CARBATROL) 300 MG 12 hr capsule TAKE 1 CAPSULE BY MOUTH TWICE DAILY 03/17/19   [place] SpanielWillis, Charles K, MD  Cyanocobalamin (B-12 PO) Take 2,500 mcg by mouth daily.     [provider]  enoxaparin (LOVENOX) 40 MG/0.4ML  injection Inject 0.4 mLs (40 mg total) into the skin daily for 14 days. 03/22/19 04/05/19  Evon SlackGaines, Thomas C, PA-C  hydrocortisone (ANUSOL-HC) 25 MG suppository Place 25 mg rectally 2 (two) times daily as needed for hemorrhoids or anal itching.     [provider]  levothyroxine (SYNTHROID, LEVOTHROID) 100 MCG tablet Take 100 mcg by mouth daily before breakfast.    [provider]  loratadine-pseudoephedrine (CLARITIN-D 24-HOUR) 10-240 MG 24 hr tablet Take 1 tablet by mouth daily.    [provider]  meclizine (ANTIVERT) 25 MG tablet Take 25 mg by mouth every 6 (six) hours as needed.    [provider]  OLANZapine (ZYPREXA) 2.5 MG tablet Take 1 tablet (2.5 mg total) by mouth at bedtime as needed. 05/13/19   [place] SpanielWillis, Charles K, MD  OLANZapine (ZYPREXA) 5 MG tablet Take 1 tablet (5 mg total) by mouth 2 (two) times a day. 04/08/19   [place] SpanielWillis, Charles K, MD  omeprazole (PRILOSEC) 20 MG capsule Take 20 mg by mouth daily.    [provider]  senna-docusate (SENOKOT-S) 8.6-50 MG tablet Take 1 tablet by mouth at bedtime as needed for mild constipation. 03/22/19   Shaune Pollackhen, Qing, MD  triamcinolone cream (KENALOG) 0.1 % Apply 1 application topically 2 (two) times daily.     [provider]  zolendronic acid (ZOMETA) 4 MG/5ML injection Inject 4 mg into the vein once. Last received inj 11/07/17    [provider]    Allergies Aricept [donepezil], Citalopram, Sulfa antibiotics, and Tetracycline  Family History  Problem Relation Age of Onset  . Heart disease Mother   . COPD Father   . Congestive Heart Failure Brother     Social History Social History   Tobacco Use  . Smoking status: Former Smoker    Packs/day: 1.00    Years: 45.00    Pack years: 45.00    Types: Cigarettes  . Smokeless tobacco: Never Used  Substance Use Topics  . Alcohol use: Yes    Alcohol/week: 7.0 standard drinks    Types: 7 Glasses of wine per week    Comment: wine on occasion   . Drug use: No    Review of Systems  Review of Systems  Unable to perform ROS: Dementia  Constitutional: Positive for fatigue.  Respiratory: Positive for chest tightness.   Cardiovascular: Positive for chest pain.     ____________________________________________  PHYSICAL EXAM:      VITAL SIGNS: ED Triage Vitals  Enc Vitals Group     BP 06/06/19 1018 100/66     Pulse Rate 06/06/19 1018 (!) 52     Resp 06/06/19 1018 (!) 22     Temp 06/06/19 1018 98.6 F (37 C)     Temp Source 06/06/19 1018 Oral     SpO2 06/06/19 1018 97 %     Weight 06/06/19 1019 106 lb (48.1 kg)  Height 06/06/19 1019 5\' 4"  (1.626 m)     Head Circumference --      Peak Flow --      Pain Score 06/06/19 1409 6     Pain Loc --      Pain Edu? --      Excl. in [place]? --      Physical Exam Vitals signs and nursing note reviewed.  Constitutional:      General: She is not in acute distress.    Appearance: She is well-developed.  HENT:     Head: Normocephalic and atraumatic.  Eyes:     Conjunctiva/sclera: Conjunctivae normal.  Neck:     Musculoskeletal: Neck supple.     Comments: Marked kyphosis noted with diffuse muscular tenderness, but no midline tenderness Cardiovascular:     Rate and Rhythm: Tachycardia present. Rhythm irregularly irregular.     Heart sounds: Normal heart sounds. No murmur. No friction rub.     Comments: Tenderness to palpation over the anterior sternum.  No bruising or deformity. Pulmonary:     Effort: Pulmonary effort is normal. No respiratory distress.     Breath sounds: Normal breath sounds. No wheezing or rales.  Abdominal:     General: There is no distension.     Palpations: Abdomen is soft.     Tenderness: There is no abdominal tenderness.  Skin:    General: Skin is warm.     Capillary Refill: Capillary refill takes less than 2 seconds.  Neurological:     Mental Status: She is alert and oriented to person, place, and time.     Motor: No abnormal muscle tone.        ____________________________________________   LABS (all labs ordered are listed, but only abnormal results are displayed)  Labs Reviewed  BASIC METABOLIC PANEL - Abnormal; Notable for the following components:      Result Value   Glucose, Bld 105 (*)    Calcium 8.8 (*)    All other components within normal limits  CBC - Abnormal; Notable for the following components:   RBC 3.38 (*)    Hemoglobin 9.8 (*)    HCT 31.0 (*)    All other components within normal limits  BRAIN NATRIURETIC PEPTIDE  HEPATIC FUNCTION PANEL  TROPONIN I (HIGH SENSITIVITY)  TROPONIN I (HIGH SENSITIVITY)    ____________________________________________  EKG: Normal sinus rhythm with market sinus arrhythmia, ventricular rate 88.  Bifascicular block.  No ST or T-segment elevations or depressions. ________________________________________  RADIOLOGY All imaging, including plain films, CT scans, and ultrasounds, independently reviewed by me, and interpretations confirmed via formal radiology reads.  ED MD interpretation:   CT angios: Sternal fracture, no PE CT C-spine: Marketed degenerative disease, no acute fracture  Official radiology report(s): Dg Chest 2 View  Result Date: 06/06/2019 CLINICAL DATA:  Chest pain EXAM: CHEST - 2 VIEW COMPARISON:  03/19/2019, 02/10/2019 FINDINGS: The heart size and mediastinal contours are stable. Calcific aortic knob. Chronic coarsened interstitial markings bilaterally. No focal airspace consolidation. No pleural effusion or pneumothorax. Multiple chronic right-sided rib fractures. Redemonstration of deformity of the left humeral head and neck. IMPRESSION: No active cardiopulmonary disease. Electronically Signed   By: Davina Poke M.D.   On: 06/06/2019 11:17   Ct Angio Chest Pe W And/or Wo Contrast  Result Date: 06/06/2019 CLINICAL DATA:  Chest pain EXAM: CT ANGIOGRAPHY CHEST WITH CONTRAST TECHNIQUE: Multidetector CT imaging of the chest was performed using the standard  protocol during bolus administration of intravenous contrast.  Multiplanar CT image reconstructions and MIPs were obtained to evaluate the vascular anatomy. CONTRAST:  60mL OMNIPAQUE IOHEXOL 350 MG/ML SOLN COMPARISON:  03/18/2017 FINDINGS: Cardiovascular: Heart size is upper limits of normal. No pericardial effusion. Satisfactory opacification of the pulmonary arteries. No filling defects to the segmental branch level no evidence of pulmonary embolism. Mild atherosclerotic calcifications of the aorta and coronary arteries. Thoracic aorta is tortuous. Mediastinum/Nodes: No enlarged mediastinal, hilar, or axillary lymph nodes. Thyroid gland, trachea, and esophagus demonstrate no significant findings. Lungs/Pleura: No focal airspace consolidation, pleural effusion, or pneumothorax. Mild bibasilar linear scarring/atelectasis. Upper Abdomen: No acute abnormality. Musculoskeletal: Minimally displaced fracture of the sternal body superiorly with associated swelling of the overlying chest wall (series 8, image 43). There is a healed fracture more inferiorly of the sternum and an additional healed manubrial fracture. Multiple healed bilateral rib fractures. Levocurvature of the upper thoracic spine. Degenerative changes of the visualized lower cervical spine. Review of the MIP images confirms the above findings. IMPRESSION: 1. Negative for pulmonary embolism. 2. Acute, minimally displaced fracture of the sternal body. 3. Lungs are clear. Electronically Signed   By: Duanne Guess M.D.   On: 06/06/2019 15:25   Ct Cervical Spine Wo Contrast  Result Date: 06/06/2019 CLINICAL DATA:  C-spine stenosis. Patient reports right-sided shoulder, chest, and upper back/neck pain. EXAM: CT CERVICAL SPINE WITHOUT CONTRAST TECHNIQUE: Multidetector CT imaging of the cervical spine was performed without intravenous contrast. Multiplanar CT image reconstructions were also generated. COMPARISON:  CT of the cervical spine 12/06/2018  FINDINGS: Alignment: There is straightening of the normal cervical lordosis. Grade 1 anterolisthesis at C5-6 measures 5 mm. Grade 1 anterolisthesis at C6-7 measures 2.5 mm. There is slight anterolisthesis at T1-2 greater than T2-3. Mild rightward curvature is present in the cervical spine. This compensates for leftward curvature in the upper thoracic spine. Skull base and vertebrae: Advanced degenerative changes are present at C1-2 without significant change. Left greater than right lateral mass articulation degenerative changes are noted. There is chronic loss of height at C6 and along the superior endplate of C7. There is chronic loss of height along the inferior endplate of C5. There is no significant change. Vertebral body heights are otherwise maintained. Soft tissues and spinal canal: Atherosclerotic calcifications are again noted at the left carotid bifurcation. Soft tissues the neck are otherwise unremarkable. Disc levels: Advanced facet hypertrophy and uncovertebral changes contribute to foraminal narrowing at C5-6 and C6-7, left greater than right. This is similar the prior exam. No new stenosis or significant disc disease is present. Upper chest: Mild dependent atelectasis is present. Thoracic inlet is normal. There is some scarring at the lung apices bilaterally. IMPRESSION: 1. Similar appearance of advanced spondylosis in the cervical spine. 2. Advanced degenerative changes at C1-2 with left greater than right lateral mass articular disease contributing to left foraminal stenosis. 3. Grade 1 anterolisthesis at C5-6 and C6-7 advanced facet hypertrophy leads to left greater than right foraminal stenosis at both levels. 4. Chronic loss of vertebral body height at C6 associated with the endplate degenerative changes. 5. No acute abnormalities. Electronically Signed   By: Marin Roberts M.D.   On: 06/06/2019 15:29    ____________________________________________  PROCEDURES   Procedure(s)  performed (including Critical Care):  Procedures  ____________________________________________  INITIAL IMPRESSION / MDM / ASSESSMENT AND PLAN / ED COURSE  As part of my medical decision making, I reviewed the following data within the electronic MEDICAL RECORD NUMBER Notes from prior ED visits and [place] Controlled Substance  Database      *Suzanne Russo was evaluated in Emergency Department on 06/06/2019 for the symptoms described in the history of present illness. She was evaluated in the context of the global COVID-19 pandemic, which necessitated consideration that the patient might be at risk for infection with the SARS-CoV-2 virus that causes COVID-19. Institutional protocols and algorithms that pertain to the evaluation of patients at risk for COVID-19 are in a state of rapid change based on information released by regulatory bodies including the CDC and federal and state organizations. These policies and algorithms were followed during the patient's care in the ED.  Some ED evaluations and interventions may be delayed as a result of limited staffing during the pandemic.*      Medical Decision Making: 85 year old female here with reported chest pain.  Interestingly, imaging shows sternal fracture and patient does have a history of recurrent falls.  This appears acute compared to her old sternal fracture, which is known.  She also appears to be in new onset atrial fibrillation, which I suspect is more so a pain response to her sternal fracture.  Given her age and comorbidities, will admit for pain control and monitoring.  Would be very hesitant to start on systemic anticoagulation in the setting of her recurrent falls, but will leave this to the inpatient team.  Otherwise, no evidence of occult infection.  She is at her mental baseline per family.  No known falls, I suspect this could have been secondary to moving in the bed and coughing. No known head trauma.   ____________________________________________  FINAL CLINICAL IMPRESSION(S) / ED DIAGNOSES  Final diagnoses:  New onset atrial fibrillation (HCC)  Closed fracture of body of sternum, initial encounter  Chest wall pain     MEDICATIONS GIVEN DURING THIS VISIT:  Medications  sodium chloride flush (NS) 0.9 % injection 3 mL (has no administration in time range)  iohexol (OMNIPAQUE) 350 MG/ML injection 75 mL (75 mLs Intravenous Contrast Given 06/06/19 1457)  acetaminophen (TYLENOL) tablet 1,000 mg (1,000 mg Oral Given 06/06/19 1605)  oxyCODONE (Oxy IR/ROXICODONE) immediate release tablet 2.5 mg (2.5 mg Oral Given 06/06/19 1605)     ED Discharge Orders    None       Note:  This document was prepared using Dragon voice recognition software and may include unintentional dictation errors.   Shaune PollackIsaacs, Mackenzye Mackel, MD 06/06/19 [phone]

## 2019-06-06 NOTE — H&P (Addendum)
[employer] [place] [facility]   PATIENT NAME: Suzanne Russo    MR#:  [id number]  DATE OF BIRTH:  1931-10-16  DATE OF ADMISSION:  06/06/2019  PRIMARY CARE PHYSICIAN: Marisue IvanLinthavong, Kanhka, MD   REQUESTING/REFERRING PHYSICIAN: Dr. Erma HeritageIsaacs.  CHIEF COMPLAINT:   Chief Complaint  Patient presents with   Chest Pain   Shortness of Breath   Shortness of breath and chest pain for several days. HISTORY OF PRESENT ILLNESS:  Suzanne Russo  is a 85 y.o. female with a known history of dementia, DVT, GERD, pelvic fracture, PUD, right clavicle fracture, etc. is poor historian due to dementia.  Per ED physician, the patient has increased chest pain over last week and shortness of breath for several days.  She is also fragile and has generalized weakness.  She is slightly more confused and has poor oral intake.  She has history of frequent fall.  She is found sternal fracture and a new onset A. Fib with HR at 130-140s in the ED.  ED physician is hesitated to start anticoagulation due to recurrent falls. PAST MEDICAL HISTORY:   Past Medical History:  Diagnosis Date   Carpal tunnel syndrome    Concussion with no loss of consciousness 08/26/2015   Degenerative arthritis    Dementia (HCC) 02/13/2019   DVT (deep venous thrombosis) (HCC)    GERD (gastroesophageal reflux disease)    Hemorrhoids    Hypothyroidism    Memory difficulty 07/16/2014   Occipital neuralgia    Occipital neuralgia of left side 12/15/2014   Osteoporosis    Pelvic fracture (HCC)    Peptic ulcer disease    Postconcussive syndrome 12/20/2018   Right clavicle fracture    Trigeminal neuralgia    Venous insufficiency     PAST SURGICAL HISTORY:   Past Surgical History:  Procedure Laterality Date   bilateral cataract surgery     bilateral knee surgery     INTRAMEDULLARY (IM) NAIL INTERTROCHANTERIC Right 03/19/2019   Procedure: INTRAMEDULLARY (IM) NAIL INTERTROCHANTRIC;  Surgeon: Christena FlakePoggi, John  J, MD;  Location: [facility] ORS;  Service: Orthopedics;  Laterality: Right;   left hip fracture     left thumb surgery     left toe surgery     NASAL SINUS SURGERY     TONSILLECTOMY     VAGINAL HYSTERECTOMY      SOCIAL HISTORY:   Social History   Tobacco Use   Smoking status: Former Smoker    Packs/day: 1.00    Years: 45.00    Pack years: 45.00    Types: Cigarettes   Smokeless tobacco: Never Used  Substance Use Topics   Alcohol use: Yes    Alcohol/week: 7.0 standard drinks    Types: 7 Glasses of wine per week    Comment: wine on occasion    FAMILY HISTORY:   Family History  Problem Relation Age of Onset   Heart disease Mother    COPD Father    Congestive Heart Failure Brother     DRUG ALLERGIES:   Allergies  Allergen Reactions   Aricept [Donepezil]     GI upset   Citalopram Other (See Comments)    Caused bad dreams   Sulfa Antibiotics Rash   Tetracycline Rash    REVIEW OF SYSTEMS:   Review of Systems  Unable to perform ROS: Dementia    MEDICATIONS AT HOME:   Prior to Admission medications   Medication Sig Start Date End Date Taking? Authorizing Provider  acetaminophen (TYLENOL)  500 MG tablet Take 1,000 mg by mouth every 8 (eight) hours.     [provider]  bisacodyl (DULCOLAX) 5 MG EC tablet Take 1 tablet (5 mg total) by mouth daily as needed for moderate constipation. 03/22/19   Demetrios Loll, MD  Calcium Carbonate-Vit D-Min (CALTRATE 600+D PLUS MINERALS) 600-800 MG-UNIT TABS Take 1 tablet by mouth 2 (two) times daily.    [provider]  carbamazepine (CARBATROL) 200 MG 12 hr capsule Take 1 capsule (200 mg total) by mouth 2 (two) times daily. 03/04/19   Kathrynn Ducking, MD  carbamazepine (CARBATROL) 300 MG 12 hr capsule TAKE 1 CAPSULE BY MOUTH TWICE DAILY 03/17/19   Kathrynn Ducking, MD  Cyanocobalamin (B-12 PO) Take 2,500 mcg by mouth daily.     [provider]  enoxaparin (LOVENOX) 40 MG/0.4ML injection Inject 0.4  mLs (40 mg total) into the skin daily for 14 days. 03/22/19 04/05/19  Duanne Guess, PA-C  hydrocortisone (ANUSOL-HC) 25 MG suppository Place 25 mg rectally 2 (two) times daily as needed for hemorrhoids or anal itching.     [provider]  levothyroxine (SYNTHROID, LEVOTHROID) 100 MCG tablet Take 100 mcg by mouth daily before breakfast.    [provider]  loratadine-pseudoephedrine (CLARITIN-D 24-HOUR) 10-240 MG 24 hr tablet Take 1 tablet by mouth daily.    [provider]  meclizine (ANTIVERT) 25 MG tablet Take 25 mg by mouth every 6 (six) hours as needed.    [provider]  OLANZapine (ZYPREXA) 2.5 MG tablet Take 1 tablet (2.5 mg total) by mouth at bedtime as needed. 05/13/19   Kathrynn Ducking, MD  OLANZapine (ZYPREXA) 5 MG tablet Take 1 tablet (5 mg total) by mouth 2 (two) times a day. 04/08/19   Kathrynn Ducking, MD  omeprazole (PRILOSEC) 20 MG capsule Take 20 mg by mouth daily.    [provider]  senna-docusate (SENOKOT-S) 8.6-50 MG tablet Take 1 tablet by mouth at bedtime as needed for mild constipation. 03/22/19   Demetrios Loll, MD  triamcinolone cream (KENALOG) 0.1 % Apply 1 application topically 2 (two) times daily.     [provider]  zolendronic acid (ZOMETA) 4 MG/5ML injection Inject 4 mg into the vein once. Last received inj 11/07/17    [provider]      VITAL SIGNS:  Blood pressure (!) 123/92, pulse 67, temperature 98.6 F (37 C), temperature source Oral, resp. rate 17, height 5\' 4"  (1.626 m), weight 48.1 kg, SpO2 99 %.  PHYSICAL EXAMINATION:  Physical Exam  GENERAL:  85 y.o.-year-old patient lying in the bed with no acute distress.  EYES: Pupils equal, round, reactive to light and accommodation. No scleral icterus. Extraocular muscles intact.  HEENT: Head atraumatic, normocephalic. Oropharynx and nasopharynx clear.  NECK:  Supple, no jugular venous distention. No thyroid enlargement, no tenderness.  LUNGS:  Normal breath sounds bilaterally, no wheezing, rales,rhonchi or crepitation. No use of accessory muscles of respiration.  Chest tenderness on palpation on sternum. CARDIOVASCULAR: Irregular rhythm and tachycardia. No murmurs, rubs, or gallops.  ABDOMEN: Soft, nontender, nondistended. Bowel sounds present. No organomegaly or mass.  EXTREMITIES: No pedal edema, cyanosis, or clubbing.  NEUROLOGIC: Cranial nerves II through XII are intact. Muscle strength 4/5 in all extremities. Sensation intact. Gait not checked.  PSYCHIATRIC: The patient is alert and oriented x 1-2.  SKIN: No obvious rash, lesion, or ulcer.   LABORATORY PANEL:   CBC Recent Labs  Lab 06/06/19 1031  WBC 10.4  HGB 9.8*  HCT 31.0*  PLT 304   ------------------------------------------------------------------------------------------------------------------  Chemistries  Recent Labs  Lab 06/06/19 1031 06/06/19 1330  NA 137  --   K 4.3  --   CL 102  --   CO2 27  --   GLUCOSE 105*  --   BUN 20  --   CREATININE 0.44  --   CALCIUM 8.8*  --   AST  --  18  ALT  --  16  ALKPHOS  --  83  BILITOT  --  0.2*   ------------------------------------------------------------------------------------------------------------------  Cardiac Enzymes No results for input(s): TROPONINI in the last 168 hours. ------------------------------------------------------------------------------------------------------------------  RADIOLOGY:  Dg Chest 2 View  Result Date: 06/06/2019 CLINICAL DATA:  Chest pain EXAM: CHEST - 2 VIEW COMPARISON:  03/19/2019, 02/10/2019 FINDINGS: The heart size and mediastinal contours are stable. Calcific aortic knob. Chronic coarsened interstitial markings bilaterally. No focal airspace consolidation. No pleural effusion or pneumothorax. Multiple chronic right-sided rib fractures. Redemonstration of deformity of the left humeral head and neck. IMPRESSION: No active cardiopulmonary disease. Electronically Signed    By: Duanne GuessNicholas  Plundo M.D.   On: 06/06/2019 11:17   Ct Angio Chest Pe W And/or Wo Contrast  Result Date: 06/06/2019 CLINICAL DATA:  Chest pain EXAM: CT ANGIOGRAPHY CHEST WITH CONTRAST TECHNIQUE: Multidetector CT imaging of the chest was performed using the standard protocol during bolus administration of intravenous contrast. Multiplanar CT image reconstructions and MIPs were obtained to evaluate the vascular anatomy. CONTRAST:  75mL OMNIPAQUE IOHEXOL 350 MG/ML SOLN COMPARISON:  03/18/2017 FINDINGS: Cardiovascular: Heart size is upper limits of normal. No pericardial effusion. Satisfactory opacification of the pulmonary arteries. No filling defects to the segmental branch level no evidence of pulmonary embolism. Mild atherosclerotic calcifications of the aorta and coronary arteries. Thoracic aorta is tortuous. Mediastinum/Nodes: No enlarged mediastinal, hilar, or axillary lymph nodes. Thyroid gland, trachea, and esophagus demonstrate no significant findings. Lungs/Pleura: No focal airspace consolidation, pleural effusion, or pneumothorax. Mild bibasilar linear scarring/atelectasis. Upper Abdomen: No acute abnormality. Musculoskeletal: Minimally displaced fracture of the sternal body superiorly with associated swelling of the overlying chest wall (series 8, image 43). There is a healed fracture more inferiorly of the sternum and an additional healed manubrial fracture. Multiple healed bilateral rib fractures. Levocurvature of the upper thoracic spine. Degenerative changes of the visualized lower cervical spine. Review of the MIP images confirms the above findings. IMPRESSION: 1. Negative for pulmonary embolism. 2. Acute, minimally displaced fracture of the sternal body. 3. Lungs are clear. Electronically Signed   By: Duanne GuessNicholas  Plundo M.D.   On: 06/06/2019 15:25   Ct Cervical Spine Wo Contrast  Result Date: 06/06/2019 CLINICAL DATA:  C-spine stenosis. Patient reports right-sided shoulder, chest, and upper  back/neck pain. EXAM: CT CERVICAL SPINE WITHOUT CONTRAST TECHNIQUE: Multidetector CT imaging of the cervical spine was performed without intravenous contrast. Multiplanar CT image reconstructions were also generated. COMPARISON:  CT of the cervical spine 12/06/2018 FINDINGS: Alignment: There is straightening of the normal cervical lordosis. Grade 1 anterolisthesis at C5-6 measures 5 mm. Grade 1 anterolisthesis at C6-7 measures 2.5 mm. There is slight anterolisthesis at T1-2 greater than T2-3. Mild rightward curvature is present in the cervical spine. This compensates for leftward curvature in the upper thoracic spine. Skull base and vertebrae: Advanced degenerative changes are present at C1-2 without significant change. Left greater than right lateral mass articulation degenerative changes are noted. There is chronic loss of height at C6 and along the superior endplate of C7. There is chronic  loss of height along the inferior endplate of C5. There is no significant change. Vertebral body heights are otherwise maintained. Soft tissues and spinal canal: Atherosclerotic calcifications are again noted at the left carotid bifurcation. Soft tissues the neck are otherwise unremarkable. Disc levels: Advanced facet hypertrophy and uncovertebral changes contribute to foraminal narrowing at C5-6 and C6-7, left greater than right. This is similar the prior exam. No new stenosis or significant disc disease is present. Upper chest: Mild dependent atelectasis is present. Thoracic inlet is normal. There is some scarring at the lung apices bilaterally. IMPRESSION: 1. Similar appearance of advanced spondylosis in the cervical spine. 2. Advanced degenerative changes at C1-2 with left greater than right lateral mass articular disease contributing to left foraminal stenosis. 3. Grade 1 anterolisthesis at C5-6 and C6-7 advanced facet hypertrophy leads to left greater than right foraminal stenosis at both levels. 4. Chronic loss of  vertebral body height at C6 associated with the endplate degenerative changes. 5. No acute abnormalities. Electronically Signed   By: Marin Roberts M.D.   On: 06/06/2019 15:29      IMPRESSION AND PLAN:   A. fib with RVR. The patient will be admitted to telemetry floor. Telemetry monitor, IV Cardizem and p.o. Lopressor.  Echocardiograph and [facility] cardiology consult.  The patient has high risk for fall.  Defer anticoagulation to cardiologist. Sternal fracture.  Pain control. Frequent fall and generalized weakness.  PT evaluation.  Fall precaution. Anemia of chronic disease.  Stable. History of DVT.  Not on anticoagulation. All the records are reviewed and case discussed with ED provider. Management plans discussed with the patient, her daughter and they are in agreement.  CODE STATUS: DNR per her daughter.  TOTAL TIME TAKING CARE OF THIS PATIENT: 45 minutes.    Shaune Pollack M.D on 06/06/2019 at 4:52 PM  Between 7am to 6pm - Pager - [phone]  After 6pm go to [email or web address] - Social research officer, government  [facility] [place] [facility]  Office  [phone]  CC: Primary care physician; Marisue Ivan, MD   Note: This dictation was prepared with Dragon dictation along with smaller phrase technology. Any transcriptional errors that result from this process are unin

## 2019-06-07 ENCOUNTER — Inpatient Hospital Stay
Admit: 2019-06-07 | Discharge: 2019-06-07 | Disposition: A | Discharge status: Home / Self Care | Payer: Medicare Other | Attending: Internal Medicine | Admitting: Internal Medicine

## 2019-06-07 LAB — CBC
HCT: 29 % — ABNORMAL LOW (ref 36.0–46.0)
Hemoglobin: 9.3 g/dL — ABNORMAL LOW (ref 12.0–15.0)
MCH: 28.9 pg (ref 26.0–34.0)
MCHC: 32.1 g/dL (ref 30.0–36.0)
MCV: 90.1 fL (ref 80.0–100.0)
Platelets: 297 10*3/uL (ref 150–400)
RBC: 3.22 MIL/uL — ABNORMAL LOW (ref 3.87–5.11)
RDW: 13.2 % (ref 11.5–15.5)
WBC: 5.6 10*3/uL (ref 4.0–10.5)
nRBC: 0 % (ref 0.0–0.2)

## 2019-06-07 LAB — BASIC METABOLIC PANEL
Anion gap: 9 (ref 5–15)
BUN: 17 mg/dL (ref 8–23)
CO2: 26 mmol/L (ref 22–32)
Calcium: 8.7 mg/dL — ABNORMAL LOW (ref 8.9–10.3)
Chloride: 102 mmol/L (ref 98–111)
Creatinine, Ser: 0.55 mg/dL (ref 0.44–1.00)
GFR calc Af Amer: >60 mL/min (ref >60)
GFR calc non Af Amer: >60 mL/min (ref >60)
Glucose, Bld: 141 mg/dL — ABNORMAL HIGH (ref 70–99)
Potassium: 3.9 mmol/L (ref 3.5–5.1)
Sodium: 137 mmol/L (ref 135–145)

## 2019-06-07 LAB — TSH: TSH: 1.516 u[IU]/mL (ref 0.350–4.500)

## 2019-06-07 NOTE — Progress Notes (Addendum)
[place] at [place] NAME: Makinze Jani    MR#:  [id number]  DATE OF BIRTH:  83-02-1932  SUBJECTIVE:  CHIEF COMPLAINT:   Chief Complaint  Patient presents with   Chest Pain   Shortness of Breath  no complaints, weak REVIEW OF SYSTEMS:  Review of Systems  Constitutional: Negative for diaphoresis, fever, malaise/fatigue and weight loss.  HENT: Negative for ear discharge, ear pain, hearing loss, nosebleeds, sore throat and tinnitus.   Eyes: Negative for blurred vision and pain.  Respiratory: Negative for cough, hemoptysis, shortness of breath and wheezing.   Cardiovascular: Negative for chest pain, palpitations, orthopnea and leg swelling.  Gastrointestinal: Negative for abdominal pain, blood in stool, constipation, diarrhea, heartburn, nausea and vomiting.  Genitourinary: Negative for dysuria, frequency and urgency.  Musculoskeletal: Negative for back pain and myalgias.  Skin: Negative for itching and rash.  Neurological: Negative for dizziness, tingling, tremors, focal weakness, seizures, weakness and headaches.  Psychiatric/Behavioral: Negative for depression. The patient is not nervous/anxious.     DRUG ALLERGIES:   Allergies  Allergen Reactions   Aricept [Donepezil]     GI upset   Citalopram Other (See Comments)    Caused bad dreams   Sulfa Antibiotics Rash   Tetracycline Rash   VITALS:  Blood pressure 116/74, pulse 62, temperature 97.9 F (36.6 C), temperature source Oral, resp. rate 19, height 5\' 4"  (1.626 m), weight 48.8 kg, SpO2 95 %. PHYSICAL EXAMINATION:  Physical Exam HENT:     Head: Normocephalic and atraumatic.  Eyes:     Conjunctiva/sclera: Conjunctivae normal.     Pupils: Pupils are equal, round, and reactive to light.  Neck:     Musculoskeletal: Normal range of motion and neck supple.     Thyroid: No thyromegaly.     Trachea: No tracheal deviation.  Cardiovascular:     Rate and Rhythm: Normal rate  and regular rhythm.     Heart sounds: Normal heart sounds.  Pulmonary:     Effort: Pulmonary effort is normal. No respiratory distress.     Breath sounds: Normal breath sounds. No wheezing.  Chest:     Chest wall: No tenderness.  Abdominal:     General: Bowel sounds are normal. There is no distension.     Palpations: Abdomen is soft.     Tenderness: There is no abdominal tenderness.  Musculoskeletal: Normal range of motion.  Skin:    General: Skin is warm and dry.     Findings: No rash.  Neurological:     Mental Status: She is alert and oriented to person, place, and time.     Cranial Nerves: No cranial nerve deficit.    LABORATORY PANEL:  Female CBC Recent Labs  Lab 06/07/19 0935  WBC 5.6  HGB 9.3*  HCT 29.0*  PLT 297   ------------------------------------------------------------------------------------------------------------------ Chemistries  Recent Labs  Lab 06/06/19 1330 06/07/19 0935  NA  --  137  K  --  3.9  CL  --  102  CO2  --  26  GLUCOSE  --  141*  BUN  --  17  CREATININE  --  0.55  CALCIUM  --  8.7*  MG 2.4  --   AST 18  --   ALT 16  --   ALKPHOS 83  --   BILITOT 0.2*  --    RADIOLOGY:  Ct Angio Chest Pe W And/or Wo Contrast  Result Date: 06/06/2019 CLINICAL DATA:  Chest pain EXAM:  CT ANGIOGRAPHY CHEST WITH CONTRAST TECHNIQUE: Multidetector CT imaging of the chest was performed using the standard protocol during bolus administration of intravenous contrast. Multiplanar CT image reconstructions and MIPs were obtained to evaluate the vascular anatomy. CONTRAST:  61mL OMNIPAQUE IOHEXOL 350 MG/ML SOLN COMPARISON:  03/18/2017 FINDINGS: Cardiovascular: Heart size is upper limits of normal. No pericardial effusion. Satisfactory opacification of the pulmonary arteries. No filling defects to the segmental branch level no evidence of pulmonary embolism. Mild atherosclerotic calcifications of the aorta and coronary arteries. Thoracic aorta is tortuous.  Mediastinum/Nodes: No enlarged mediastinal, hilar, or axillary lymph nodes. Thyroid gland, trachea, and esophagus demonstrate no significant findings. Lungs/Pleura: No focal airspace consolidation, pleural effusion, or pneumothorax. Mild bibasilar linear scarring/atelectasis. Upper Abdomen: No acute abnormality. Musculoskeletal: Minimally displaced fracture of the sternal body superiorly with associated swelling of the overlying chest wall (series 8, image 43). There is a healed fracture more inferiorly of the sternum and an additional healed manubrial fracture. Multiple healed bilateral rib fractures. Levocurvature of the upper thoracic spine. Degenerative changes of the visualized lower cervical spine. Review of the MIP images confirms the above findings. IMPRESSION: 1. Negative for pulmonary embolism. 2. Acute, minimally displaced fracture of the sternal body. 3. Lungs are clear. Electronically Signed   By: Duanne Guess M.D.   On: 06/06/2019 15:25   Ct Cervical Spine Wo Contrast  Result Date: 06/06/2019 CLINICAL DATA:  C-spine stenosis. Patient reports right-sided shoulder, chest, and upper back/neck pain. EXAM: CT CERVICAL SPINE WITHOUT CONTRAST TECHNIQUE: Multidetector CT imaging of the cervical spine was performed without intravenous contrast. Multiplanar CT image reconstructions were also generated. COMPARISON:  CT of the cervical spine 12/06/2018 FINDINGS: Alignment: There is straightening of the normal cervical lordosis. Grade 1 anterolisthesis at C5-6 measures 5 mm. Grade 1 anterolisthesis at C6-7 measures 2.5 mm. There is slight anterolisthesis at T1-2 greater than T2-3. Mild rightward curvature is present in the cervical spine. This compensates for leftward curvature in the upper thoracic spine. Skull base and vertebrae: Advanced degenerative changes are present at C1-2 without significant change. Left greater than right lateral mass articulation degenerative changes are noted. There is chronic  loss of height at C6 and along the superior endplate of C7. There is chronic loss of height along the inferior endplate of C5. There is no significant change. Vertebral body heights are otherwise maintained. Soft tissues and spinal canal: Atherosclerotic calcifications are again noted at the left carotid bifurcation. Soft tissues the neck are otherwise unremarkable. Disc levels: Advanced facet hypertrophy and uncovertebral changes contribute to foraminal narrowing at C5-6 and C6-7, left greater than right. This is similar the prior exam. No new stenosis or significant disc disease is present. Upper chest: Mild dependent atelectasis is present. Thoracic inlet is normal. There is some scarring at the lung apices bilaterally. IMPRESSION: 1. Similar appearance of advanced spondylosis in the cervical spine. 2. Advanced degenerative changes at C1-2 with left greater than right lateral mass articular disease contributing to left foraminal stenosis. 3. Grade 1 anterolisthesis at C5-6 and C6-7 advanced facet hypertrophy leads to left greater than right foraminal stenosis at both levels. 4. Chronic loss of vertebral body height at C6 associated with the endplate degenerative changes. 5. No acute abnormalities. Electronically Signed   By: Marin Roberts M.D.   On: 06/06/2019 15:29   ASSESSMENT AND PLAN:  85 year old female with hypertension, peripheral vascular disease, left ventricular hypertrophy admitted with new onset atrial fibrillation  * Paroxysmal A. fib with RVR. -  spontaneously converted  to normal rhythm - multifactorial in nature with a significant fall risk and no evidence of advanced heart block or cardiac causes of syncope - Continue low-dose metoprolol for heart rate control and maintenance of normal sinus rhythm  - No use of anticoagulation at this time due to isolated incident of atrial fibrillation at this time and concerns for significant fall risk and dementia per cardio  * Sternal  fracture.  Pain well controlled at this time  * Frequent fall and generalized weakness.  PT evaluation.  Fall precaution.  * Anemia of chronic disease.  Stable.  *History of DVT.  Not on anticoagulation. High risk for fall     All the records are reviewed and case discussed with Care Management/Social Worker. Management plans discussed with the patient, nursing and they are in agreement.  CODE STATUS: DNR  TOTAL TIME TAKING CARE OF THIS PATIENT: 35 minutes.   More than 50% of the time was spent in counseling/coordination of care: YES  POSSIBLE D/C IN 1-2 DAYS, DEPENDING ON CLINICAL CONDITION.   Delfino LovettVipul Raena Pau M.D on 06/07/2019 at 1:44 PM  Between 7am to 6pm - Pager - [phone]  After 6pm go to [email or web address] - Social research officer, governmentpassword EPAS [facility]  [facility]  Office  [phone]  CC: Primary care physician; Marisue IvanLinthavong, Kanhka, MD  Note: This dictation was prepared with Dragon dictation along with smaller phrase technology. Any transcriptional errors that result from this process are unintentional.

## 2019-06-07 NOTE — Evaluation (Signed)
Physical Therapy Evaluation Patient Details Name: Suzanne Russo MRN: [id number] DOB: 08/30/1932 Today's Date: 06/07/2019   History of Present Illness  Suzanne Russo is a 85 y.o. female who presented to the hospital ED on 06/06/2019 with chest pain.  Patient was admitted to the hospital with new onset A fib with RVR, sternal fracture, and frequent falls with generalized weakness.  Relevant PMH includes OA, osteoporosis, dementia, postconcussive syndrome, trigeminal neuralgia, venous insufficiency, HTN, hip fx (03/19/2019), R clavicle fracture (12/20/2018), pelvic fracture, R hip IM nail 03/19/2019.  Imaging shows acute sternal fracture. She also has a history of subarachnoid bleed that affected her cognition earlier this year.   Clinical Impression  Patient confused and unable to provide reliable history. Daughter, Suzanne Russo, present and provided history. Prior to hospitalization, patient lives at home with husband in a single story home with ramped entrance, with 24/7 care. She has a hospital bed, RW, rollator, w/c, grab bars throughout the bathroom, walk in shower with seat, hand held shower head, and elevated toilet seat. Prior to hospitalization, she ambulated in the home with assistance using RW and complete PT exercises daily. She used w/c for out of home mobility and required assistance with ADLs, IADLs except feeding herself. Upon physical therapy evaluation, patient required max A for bed mobility and max A - total A for transfers and was unable to attempt ambulation safely. She became very frightened during transfers and demo strong backward lean and inability to follow directions during transfer. Patient appears to have experienced a decline in functional mobility and requires very heavy assist for transfers at this point. She may benefit from short term rehabilitation prior to returning home to improve her ability to complete mobility with less assistance. Patient would benefit from physical therapy  (see PT Problem List below) to address remaining impairments and functional limitations to work towards stated goals and return to PLOF or maximal functional independence.      Follow Up Recommendations SNF    Equipment Recommendations  Other (comment)(consider hoyer lift)    Recommendations for Other Services OT consult     Precautions / Restrictions Precautions Precautions: Fall Restrictions Weight Bearing Restrictions: No      Mobility  Bed Mobility Overal bed mobility: Needs Assistance Bed Mobility: Supine to Sit     Supine to sit: Max assist;HOB elevated     General bed mobility comments: Patient able to use hands to pull and push a little bit when coming supine to sit.  Transfers Overall transfer level: Needs assistance Equipment used: Rolling walker (2 wheeled) Transfers: Stand Pivot Transfers   Stand pivot transfers: Max assist;+2 safety/equipment       General transfer comment: Patient stood up at edge of bed x 1 with max A x 1 and transfered bed to chair attempting to use RW but became a stand pivot with max A - total A x 1 with daughter helping to calm her when she became frightened and upset during transfer. Patient demo backward lean that was correctable with cuing first attempt but patient became very frightened second attempt and could no longer follow commands but was safely guided to chair by PT. Patient repeating over and over that she would fall whe PT let go, then later asked PT to back off when she was attempting to scoot back in her chair. Overall confused and upset/frightened but able to calm with time and comforting words and positioning.  Ambulation/Gait  General Gait Details: gait not appropriate at this time.  Stairs            Wheelchair Mobility    Modified Rankin (Stroke Patients Only)       Balance Overall balance assessment: Needs assistance Sitting-balance support: Bilateral upper extremity  supported Sitting balance-Leahy Scale: Poor Sitting balance - Comments: patient unpredictably falls back in sitting without support Postural control: Posterior lean Standing balance support: Bilateral upper extremity supported;During functional activity Standing balance-Leahy Scale: Zero Standing balance comment: patient with strong backwards lean and becomes very frightened and unable to follow commands.                             Pertinent Vitals/Pain Pain Assessment: Faces Faces Pain Scale: Hurts little more Pain Location: sternum when performing bed mobility. Pain Descriptors / Indicators: Grimacing Pain Intervention(s): Limited activity within patient's tolerance;Monitored during session;Repositioned    Home Living Family/patient expects to be discharged to:: Private residence Living Arrangements: Spouse/significant other Available Help at Discharge: Personal care attendant;Available 24 hours/day(24/7 care. Daughter lives 1 mile away) Type of Home: House Home Access: Ramped entrance(no handrail)     Home Layout: One level;Laundry or work area in [employer] Home Equipment: Environmental consultantWalker - 4 wheels;Walker - 2 wheels;Bedside commode;Hand held shower head;Shower seat;Toilet riser;Hospital bed;Grab bars - tub/shower;Grab bars - toilet;Wheelchair - manual      Prior Function Level of Independence: Needs assistance   Gait / Transfers Assistance Needed: walks with assistance in the home using rollator on "good" days. Uses w/c for transportation out of the home, which mostly consists of medical visits.  ADL's / Homemaking Assistance Needed: caregivers assist with dressing, bathing, housework. She can feed herself usually.  Comments: Patient unable to provide reliable history. Daughter, Suzanne Russo present and provided history. Patient able to state her name but not her birthdate. Able to state she is at a hospital [place].     Hand Dominance   Dominant Hand:  Right    Extremity/Trunk Assessment   Upper Extremity Assessment Upper Extremity Assessment: Generalized weakness    Lower Extremity Assessment Lower Extremity Assessment: Generalized weakness    Cervical / Trunk Assessment Cervical / Trunk Assessment: Kyphotic(neck extremely kyphotic and unable to hold head up stright)  Communication   Communication: HOH;Other (comment)(decreased cognition)  Cognition Arousal/Alertness: Awake/alert Behavior During Therapy: Anxious(became very tearful and frightened during transfer, able to calm with time) Overall Cognitive Status: History of cognitive impairments - at baseline                                 General Comments: Daughter reports patient experienced significant decline in cognition following sub-arachnoid bleed earler this year      General Comments      Exercises Other Exercises Other Exercises: educated daughter and patient about motor control and sense of balance and how this can be disturbed to make it feel like one is falling or that PT is pulling off balance when actually PT is helping them stay balanced. Spent time comforting patient.   Assessment/Plan    PT Assessment Patient needs continued PT services  PT Problem List Decreased strength;Decreased mobility;Decreased safety awareness;Decreased coordination;Decreased range of motion;Decreased knowledge of precautions;Decreased activity tolerance;Decreased cognition;Decreased balance;Decreased knowledge of use of DME;Pain       PT Treatment Interventions DME instruction;Therapeutic activities;Cognitive remediation;Gait training;Therapeutic exercise;Patient/family education;Balance training;Functional mobility training;Neuromuscular  re-education    PT Goals (Current goals can be found in the Care Plan section)  Acute Rehab PT Goals Patient Stated Goal: return home PT Goal Formulation: With patient/family Time For Goal Achievement: 06/21/19 Potential to  Achieve Goals: Fair    Frequency Min 2X/week   Barriers to discharge   patient requires max - total A for transfers    Co-evaluation               AM-PAC PT "6 Clicks" Mobility  Outcome Measure Help needed turning from your back to your side while in a flat bed without using bedrails?: A Lot Help needed moving from lying on your back to sitting on the side of a flat bed without using bedrails?: A Lot Help needed moving to and from a bed to a chair (including a wheelchair)?: Total Help needed standing up from a chair using your arms (e.g., wheelchair or bedside chair)?: A Lot Help needed to walk in hospital room?: Total Help needed climbing 3-5 steps with a railing? : Total 6 Click Score: 9    End of Session Equipment Utilized During Treatment: Gait belt Activity Tolerance: Other (comment)(patient very frightened during transfer, calmed down with comforting words and repositioning.) Patient left: in chair;with call bell/phone within reach;with chair alarm set;with family/visitor present Nurse Communication: Mobility status;Precautions PT Visit Diagnosis: Unsteadiness on feet (R26.81);Muscle weakness (generalized) (M62.81);History of falling (Z91.81);Difficulty in walking, not elsewhere classified (R26.2)    Time: 1405-1450 PT Time Calculation (min) (ACUTE ONLY): 45 min   Charges:   PT Evaluation $PT Eval Moderate Complexity: 1 Mod PT Treatments $Therapeutic Activity: 23-37 mins        Luretha Murphy. Ilsa Iha, PT, DPT 06/07/19, 4:27 PM

## 2019-06-07 NOTE — Consult Note (Signed)
[facility] Consultation [facility]  Patient ID: Suzanne Pepperancy T Russo, MRN: [id number], DOB/AGE: Mar 11, 1932 85 y.o. Admit date: 06/06/2019   Date of Consult: 06/07/2019 Primary Physician: Marisue IvanLinthavong, Kanhka, MD [facility]  Chief Complaint:  Chief Complaint  Patient presents with  . Chest Pain  . Shortness of Breath   Reason for Consult: Atrial fibrillation  HPI: 85 y.o. female with known hypertension peripheral vascular disease and dementia with multiple episodes of falls and fractures in the last several months who has had reasonable work-up for evaluation of possible falling and bleeding complications by Holter monitor.  Holter monitor did show some bradycardia but no atrial fibrillation or advanced heart block.  The patient did not have any other primary cardiac causes of falling.  In addition to that echocardiogram showed normal LV systolic function with moderate left ventricular hypertrophy and mild valvular heart disease.  The patient does have dementia and cannot always tell all of her stories although she does have some peripheral vascular disease for which she has remained on metoprolol for hypertension control.  She was seen in the emergency room for new onset significant progressive chest discomfort back discomfort weakness fatigue dizziness and weakness.  After chest x-ray does appear she does have some sternal fracture although there could be other concerns of spinal stenosis spinal fracture and scoliosis causing these issues.  Incidentally she had atrial fibrillation with rapid ventricular rate of 100 bpm and now has converted back to normal sinus rhythm with medication management.  The patient currently feels pretty well sitting in her chair and is hemodynamically stable  Past Medical History:  Diagnosis Date  . Carpal tunnel syndrome   . Concussion with no loss of consciousness 08/26/2015  . Degenerative arthritis   . Dementia (HCC) 02/13/2019  . DVT (deep  venous thrombosis) (HCC)   . GERD (gastroesophageal reflux disease)   . Hemorrhoids   . Hypothyroidism   . Memory difficulty 07/16/2014  . Occipital neuralgia   . Occipital neuralgia of left side 12/15/2014  . Osteoporosis   . Pelvic fracture (HCC)   . Peptic ulcer disease   . Postconcussive syndrome 12/20/2018  . Right clavicle fracture   . Trigeminal neuralgia   . Venous insufficiency       Surgical History:  Past Surgical History:  Procedure Laterality Date  . bilateral cataract surgery    . bilateral knee surgery    . INTRAMEDULLARY (IM) NAIL INTERTROCHANTERIC Right 03/19/2019   Procedure: INTRAMEDULLARY (IM) NAIL INTERTROCHANTRIC;  Surgeon: Christena FlakePoggi, John J, MD;  Location: [facility] ORS;  Service: Orthopedics;  Laterality: Right;  . left hip fracture    . left thumb surgery    . left toe surgery    . NASAL SINUS SURGERY    . TONSILLECTOMY    . VAGINAL HYSTERECTOMY       Home Meds: Prior to Admission medications   Medication Sig Start Date End Date Taking? Authorizing Provider  acetaminophen (TYLENOL) 500 MG tablet Take 1,000 mg by mouth every 8 (eight) hours.    Yes [provider]  bisacodyl (DULCOLAX) 5 MG EC tablet Take 1 tablet (5 mg total) by mouth daily as needed for moderate constipation. 03/22/19  Yes Shaune Pollackhen, Qing, MD  Calcium Carbonate-Vit D-Min (CALTRATE 600+D PLUS MINERALS) 600-800 MG-UNIT TABS Take 1 tablet by mouth 2 (two) times daily.   Yes [provider]  carbamazepine (CARBATROL) 200 MG 12 hr capsule Take 1 capsule (200 mg total) by mouth 2 (two) times daily. 03/04/19  Yes Kathrynn Ducking, MD  carbamazepine (CARBATROL) 300 MG 12 hr capsule TAKE 1 CAPSULE BY MOUTH TWICE DAILY 03/17/19  Yes Kathrynn Ducking, MD  celecoxib (CELEBREX) 200 MG capsule Take 200 mg by mouth daily. 05/17/19  Yes [provider]  Cyanocobalamin (B-12 PO) Take 2,500 mcg by mouth daily.    Yes [provider]  hydrocortisone (ANUSOL-HC) 25 MG suppository Place 25  mg rectally 2 (two) times daily as needed for hemorrhoids or anal itching.    Yes [provider]  levothyroxine (SYNTHROID, LEVOTHROID) 100 MCG tablet Take 100 mcg by mouth daily before breakfast.   Yes [provider]  loratadine-pseudoephedrine (CLARITIN-D 24-HOUR) 10-240 MG 24 hr tablet Take 1 tablet by mouth daily.   Yes [provider]  meclizine (ANTIVERT) 25 MG tablet Take 25 mg by mouth every 6 (six) hours as needed.   Yes [provider]  OLANZapine (ZYPREXA) 2.5 MG tablet Take 1 tablet (2.5 mg total) by mouth at bedtime as needed. Patient taking differently: Take 2.5 mg by mouth every evening.  05/13/19  Yes Kathrynn Ducking, MD  OLANZapine (ZYPREXA) 5 MG tablet Take 1 tablet (5 mg total) by mouth 2 (two) times a day. 04/08/19  Yes Kathrynn Ducking, MD  omeprazole (PRILOSEC) 20 MG capsule Take 20 mg by mouth daily.   Yes [provider]  senna-docusate (SENOKOT-S) 8.6-50 MG tablet Take 1 tablet by mouth at bedtime as needed for mild constipation. 03/22/19  Yes Demetrios Loll, MD  triamcinolone cream (KENALOG) 0.1 % Apply 1 application topically 2 (two) times daily.    Yes [provider]  zolendronic acid (ZOMETA) 4 MG/5ML injection Inject 4 mg into the vein once. Last received inj 11/07/17   Yes [provider]    Inpatient Medications:  . acetaminophen  1,000 mg Oral Q8H  . calcium-vitamin D  1 tablet Oral BID  . carbamazepine  400 mg Oral BID   And  . carbamazepine  100 mg Oral BID  . enoxaparin (LOVENOX) injection  40 mg Subcutaneous Q24H  . levothyroxine  100 mcg Oral Q0600  . loratadine  10 mg Oral Daily   And  . pseudoephedrine  120 mg Oral BID  . metoprolol tartrate  12.5 mg Oral BID  . OLANZapine  5 mg Oral BID  . pantoprazole  40 mg Oral Daily  . sodium chloride flush  3 mL Intravenous Once  . sodium chloride flush  3 mL Intravenous Q12H   . sodium chloride      Allergies:  Allergies  Allergen Reactions   . Aricept [Donepezil]     GI upset  . Citalopram Other (See Comments)    Caused bad dreams  . Sulfa Antibiotics Rash  . Tetracycline Rash    Social History   Socioeconomic History  . Marital status: Married    Spouse name: Not on file  . Number of children: 3  . Years of education: college  . Highest education level: Not on file  Occupational History  . Occupation: Retired  Scientific laboratory technician  . Financial resource strain: Not on file  . Food insecurity    Worry: Not on file    Inability: Not on file  . Transportation needs    Medical: Not on file    Non-medical: Not on file  Tobacco Use  . Smoking status: Former Smoker    Packs/day: 1.00    Years: 45.00    Pack years: 45.00  Types: Cigarettes  . Smokeless tobacco: Never Used  Substance and Sexual Activity  . Alcohol use: Yes    Alcohol/week: 7.0 standard drinks    Types: 7 Glasses of wine per week    Comment: wine on occasion  . Drug use: No  . Sexual activity: Not on file  Lifestyle  . Physical activity    Days per week: Not on file    Minutes per session: Not on file  . Stress: Not on file  Relationships  . Social Musicianconnections    Talks on phone: Not on file    Gets together: Not on file    Attends religious service: Not on file    Active member of club or organization: Not on file    Attends meetings of clubs or organizations: Not on file    Relationship status: Not on file  . Intimate partner violence    Fear of current or ex partner: Not on file    Emotionally abused: Not on file    Physically abused: Not on file    Forced sexual activity: Not on file  Other Topics Concern  . Not on file  Social History Narrative   Patient is right handed.   Patient drinks 3 cups caffeine daily     Family History  Problem Relation Age of Onset  . Heart disease Mother   . COPD Father   . Congestive Heart Failure Brother      Review of Systems Positive for back and chest pain Negative for: General:  chills,  fever, night sweats or weight changes.  Cardiovascular: PND orthopnea syncope dizziness  Dermatological skin lesions rashes Respiratory: Cough congestion Urologic: Frequent urination urination at night and hematuria Abdominal: negative for nausea, vomiting, diarrhea, bright red blood per rectum, melena, or hematemesis Neurologic: negative for visual changes, and/or hearing changes  All other systems reviewed and are otherwise negative except as noted above.  Labs: No results for input(s): CKTOTAL, CKMB, TROPONINI in the last 72 hours. Lab Results  Component Value Date   WBC 10.4 06/06/2019   HGB 9.8 (L) 06/06/2019   HCT 31.0 (L) 06/06/2019   MCV 91.7 06/06/2019   PLT 304 06/06/2019    Recent Labs  Lab 06/06/19 1031 06/06/19 1330  NA 137  --   K 4.3  --   CL 102  --   CO2 27  --   BUN 20  --   CREATININE 0.44  --   CALCIUM 8.8*  --   PROT  --  7.1  BILITOT  --  0.2*  ALKPHOS  --  83  ALT  --  16  AST  --  18  GLUCOSE 105*  --    No results found for: CHOL, HDL, LDLCALC, TRIG No results found for: DDIMER  Radiology/Studies:  Dg Chest 2 View  Result Date: 06/06/2019 CLINICAL DATA:  Chest pain EXAM: CHEST - 2 VIEW COMPARISON:  03/19/2019, 02/10/2019 FINDINGS: The heart size and mediastinal contours are stable. Calcific aortic knob. Chronic coarsened interstitial markings bilaterally. No focal airspace consolidation. No pleural effusion or pneumothorax. Multiple chronic right-sided rib fractures. Redemonstration of deformity of the left humeral head and neck. IMPRESSION: No active cardiopulmonary disease. Electronically Signed   By: Duanne GuessNicholas  Plundo M.D.   On: 06/06/2019 11:17   Ct Angio Chest Pe W And/or Wo Contrast  Result Date: 06/06/2019 CLINICAL DATA:  Chest pain EXAM: CT ANGIOGRAPHY CHEST WITH CONTRAST TECHNIQUE: Multidetector CT imaging of the chest was performed using  the standard protocol during bolus administration of intravenous contrast. Multiplanar CT image  reconstructions and MIPs were obtained to evaluate the vascular anatomy. CONTRAST:  75mL OMNIPAQUE IOHEXOL 350 MG/ML SOLN COMPARISON:  03/18/2017 FINDINGS: Cardiovascular: Heart size is upper limits of normal. No pericardial effusion. Satisfactory opacification of the pulmonary arteries. No filling defects to the segmental branch level no evidence of pulmonary embolism. Mild atherosclerotic calcifications of the aorta and coronary arteries. Thoracic aorta is tortuous. Mediastinum/Nodes: No enlarged mediastinal, hilar, or axillary lymph nodes. Thyroid gland, trachea, and esophagus demonstrate no significant findings. Lungs/Pleura: No focal airspace consolidation, pleural effusion, or pneumothorax. Mild bibasilar linear scarring/atelectasis. Upper Abdomen: No acute abnormality. Musculoskeletal: Minimally displaced fracture of the sternal body superiorly with associated swelling of the overlying chest wall (series 8, image 43). There is a healed fracture more inferiorly of the sternum and an additional healed manubrial fracture. Multiple healed bilateral rib fractures. Levocurvature of the upper thoracic spine. Degenerative changes of the visualized lower cervical spine. Review of the MIP images confirms the above findings. IMPRESSION: 1. Negative for pulmonary embolism. 2. Acute, minimally displaced fracture of the sternal body. 3. Lungs are clear. Electronically Signed   By: Duanne Guess M.D.   On: 06/06/2019 15:25   Ct Cervical Spine Wo Contrast  Result Date: 06/06/2019 CLINICAL DATA:  C-spine stenosis. Patient reports right-sided shoulder, chest, and upper back/neck pain. EXAM: CT CERVICAL SPINE WITHOUT CONTRAST TECHNIQUE: Multidetector CT imaging of the cervical spine was performed without intravenous contrast. Multiplanar CT image reconstructions were also generated. COMPARISON:  CT of the cervical spine 12/06/2018 FINDINGS: Alignment: There is straightening of the normal cervical lordosis. Grade 1  anterolisthesis at C5-6 measures 5 mm. Grade 1 anterolisthesis at C6-7 measures 2.5 mm. There is slight anterolisthesis at T1-2 greater than T2-3. Mild rightward curvature is present in the cervical spine. This compensates for leftward curvature in the upper thoracic spine. Skull base and vertebrae: Advanced degenerative changes are present at C1-2 without significant change. Left greater than right lateral mass articulation degenerative changes are noted. There is chronic loss of height at C6 and along the superior endplate of C7. There is chronic loss of height along the inferior endplate of C5. There is no significant change. Vertebral body heights are otherwise maintained. Soft tissues and spinal canal: Atherosclerotic calcifications are again noted at the left carotid bifurcation. Soft tissues the neck are otherwise unremarkable. Disc levels: Advanced facet hypertrophy and uncovertebral changes contribute to foraminal narrowing at C5-6 and C6-7, left greater than right. This is similar the prior exam. No new stenosis or significant disc disease is present. Upper chest: Mild dependent atelectasis is present. Thoracic inlet is normal. There is some scarring at the lung apices bilaterally. IMPRESSION: 1. Similar appearance of advanced spondylosis in the cervical spine. 2. Advanced degenerative changes at C1-2 with left greater than right lateral mass articular disease contributing to left foraminal stenosis. 3. Grade 1 anterolisthesis at C5-6 and C6-7 advanced facet hypertrophy leads to left greater than right foraminal stenosis at both levels. 4. Chronic loss of vertebral body height at C6 associated with the endplate degenerative changes. 5. No acute abnormalities. Electronically Signed   By: Marin Roberts M.D.   On: 06/06/2019 15:29    EKG: Atrial fibrillation with controlled ventricular rate left axis deviation right bundle branch block  Weights: Filed Weights   06/06/19 1019 06/06/19 2058   Weight: 48.1 kg 48.8 kg     Physical Exam: Blood pressure 116/74, pulse 62, temperature 97.9 F (  36.6 C), temperature source Oral, resp. rate 19, height 5\' 4"  (1.626 m), weight 48.8 kg, SpO2 95 %. Body mass index is 18.45 kg/m. General: Well developed, well nourished, in no acute distress. Head eyes ears nose throat: Normocephalic, atraumatic, sclera non-icteric, no xanthomas, nares are without discharge. No apparent thyromegaly and/or mass  Lungs: Normal respiratory effort.  no wheezes, no rales, no rhonchi.  Heart: RRR with normal S1 S2.  2+ left upper sternal border murmur gallop, no rub, PMI is normal size and placement, carotid upstroke normal without bruit, jugular venous pressure is normal Abdomen: Soft, non-tender, non-distended with normoactive bowel sounds. No hepatomegaly. No rebound/guarding. No obvious abdominal masses. Abdominal aorta is normal size without bruit Extremities: Trace edema. no cyanosis, no clubbing, no ulcers  Peripheral : 2+ bilateral upper extremity pulses, 2+ bilateral femoral pulses, 2+ bilateral dorsal pedal pulse Neuro: Alert and oriented. No facial asymmetry. No focal deficit. Moves all extremities spontaneously. Musculoskeletal: Normal muscle tone without kyphosis Psych:  Responds to questions appropriately with a normal affect.    Assessment: 85 year old female with hypertension peripheral vascular disease left ventricular hypertrophy now with new onset atrial fibrillation with controlled ventricular rate and spontaneously converted to normal rhythm after chest back pain multifactorial in nature with a significant fall risk and no evidence of advanced heart block or cardiac causes of syncope  Plan: 1.  Continue low-dose metoprolol for heart rate control and maintenance of normal sinus rhythm watching closely for any's concerns of advanced heart block and or bradycardia 2.  No use of anticoagulation at this time due to isolated incident of atrial  fibrillation at this time and concerns for significant fall risk and dementia 3.  No further cardiac intervention at this time with normal echocardiogram showing normal LV function with left ventricular hypertrophy unchanged from before 4.  Begin ambulation and follow-up closely for need for other adjustments of medications and treatment options with rehabilitation 5.  Further assessment of the possibility of spinal stenosis and other primary causes of chest pain  Signed, Lamar Blinks M.D. [facility] Cardiology 06/07/2019, 9:20 AM

## 2019-06-07 NOTE — Progress Notes (Signed)
Physical Therapy Treatment Patient Details Name: Suzanne Russo MRN: [id number] DOB: 1932-06-19 Today's Date: 06/07/2019    History of Present Illness Suzanne Russo is a 85 y.o. female who presented to the hospital ED on 06/06/2019 with chest pain.  Patient was admitted to the hospital with new onset A fib with RVR, sternal fracture, and frequent falls with generalized weakness.  Relevant PMH includes OA, osteoporosis, dementia, postconcussive syndrome, trigeminal neuralgia, venous insufficiency, HTN, hip fx (03/19/2019), R clavicle fracture (12/20/2018), pelvic fracture, R hip IM nail 03/19/2019.  Imaging shows acute sternal fracture. She also has a history of subarachnoid bleed that affected her cognition earlier this year.    PT Comments    Patient seated in chair and reports she is willing to work with physical therapist to get back to bed. Patient demo decreased fear and did better with squat/stand pivot transfer with max assist this session. Continues to be concerned that she will be dropped, this time stating she felt the PT was going to fall despite firm support and steady transfer. Required +2 assistance to move pt's hands into correct place. Patient better able to use LE to move buttocks once supine in bed and required +2 assistance to scoot up and reposition with draw sheet (appreciate nursing's assistance). Completed session with patient supine in bed being attended to by nursing staff. Answered all daughter's questions. Patient would benefit from continued physical therapy to address remaining impairments and functional limitations to work towards stated goals and return to PLOF or maximal functional independence.     Follow Up Recommendations  SNF     Equipment Recommendations  Other (comment)(consider hoyer lift)    Recommendations for Other Services OT consult     Precautions / Restrictions Precautions Precautions: Fall Restrictions Weight Bearing Restrictions: No     Mobility  Bed Mobility Overal bed mobility: Needs Assistance Bed Mobility: Sit to Supine     Supine to sit: Max assist;HOB elevated Sit to supine: HOB elevated;Max assist   General bed mobility comments: Patient able to lift buttocks in supine position to help scoot  Transfers Overall transfer level: Needs assistance Equipment used: None Transfers: Stand Pivot Transfers;Squat Pivot Transfers   Stand pivot transfers: Max assist;+2 safety/equipment Squat pivot transfers: +2 safety/equipment;Max assist     General transfer comment: patient transfered chair to bed with max A and squat pivot transfer. Patient able to grasp behind therapist back and push with legs at first but decreased assistance with legs when she became frightened that clinican would drop her. Required second person (daughter) to remove her hand from bed rail and replace on clinican's back to allow transfer to proceed. Became mildly anxious but calmed easily.  Ambulation/Gait             General Gait Details: gait not appropriate at this time.   Stairs             Wheelchair Mobility    Modified Rankin (Stroke Patients Only)       Balance Overall balance assessment: Needs assistance Sitting-balance support: Bilateral upper extremity supported Sitting balance-Leahy Scale: Poor Sitting balance - Comments: patient unpredictably falls back in sitting without support Postural control: Posterior lean Standing balance support: Bilateral upper extremity supported;During functional activity Standing balance-Leahy Scale: Zero Standing balance comment: patient with strong backwards lean and becomes frightened and unable to follow commands when she feels off balance in standing.  Cognition Arousal/Alertness: Awake/alert Behavior During Therapy: Anxious(became concerned that clinician would drop her during transfer, easily comforted) Overall Cognitive Status:  History of cognitive impairments - at baseline                                 General Comments: Daughter reports patient experienced significant decline in cognition following sub-arachnoid bleed earler this year      Exercises Other Exercises Other Exercises: scooting up in bed with +2 assist and draw sheet to reposition. Nursing assisting.    General Comments        Pertinent Vitals/Pain Pain Assessment: Faces Faces Pain Scale: Hurts a little bit Pain Location: sternum when performing bed mobility. Pain Descriptors / Indicators: Grimacing Pain Intervention(s): Limited activity within patient's tolerance;Monitored during session    Home Living Family/patient expects to be discharged to:: Private residence Living Arrangements: Spouse/significant other Available Help at Discharge: Personal care attendant;Available 24 hours/day(24/7 care. Daughter lives 1 mile away) Type of Home: House Home Access: Ramped entrance(no handrail)   Home Layout: One level;Laundry or work area in [place]: Environmental consultant - 4 wheels;Walker - 2 wheels;Bedside commode;Hand held shower head;Shower seat;Toilet riser;Hospital bed;Grab bars - tub/shower;Grab bars - toilet;Wheelchair - manual      Prior Function Level of Independence: Needs assistance  Gait / Transfers Assistance Needed: walks with assistance in the home using rollator on "good" days. Uses w/c for transportation out of the home, which mostly consists of medical visits. ADL's / Homemaking Assistance Needed: caregivers assist with dressing, bathing, housework. She can feed herself usually. Comments: Patient unable to provide reliable history. Daughter, [name] present and provided history. Patient able to state her name but not her birthdate. Able to state she is at a hospital but thoguht she was in [place].   PT Goals (current goals can now be found in the care plan section) Acute Rehab PT Goals Patient Stated Goal: return  home PT Goal Formulation: With patient/family Time For Goal Achievement: 06/21/19 Potential to Achieve Goals: Fair Progress towards PT goals: Progressing toward goals    Frequency    Min 2X/week      PT Plan      Co-evaluation              AM-PAC PT "6 Clicks" Mobility   Outcome Measure  Help needed turning from your back to your side while in a flat bed without using bedrails?: A Lot Help needed moving from lying on your back to sitting on the side of a flat bed without using bedrails?: A Lot Help needed moving to and from a bed to a chair (including a wheelchair)?: Total Help needed standing up from a chair using your arms (e.g., wheelchair or bedside chair)?: A Lot Help needed to walk in hospital room?: Total Help needed climbing 3-5 steps with a railing? : Total 6 Click Score: 9    End of Session Equipment Utilized During Treatment: Gait belt Activity Tolerance: Patient tolerated treatment well Patient left: with family/visitor present;in bed;with nursing/sitter in room Nurse Communication: Mobility status PT Visit Diagnosis: Unsteadiness on feet (R26.81);Muscle weakness (generalized) (M62.81);History of falling (Z91.81);Difficulty in walking, not elsewhere classified (R26.2)     Time: 1761-6073 PT Time Calculation (min) (ACUTE ONLY): 15 min  Charges:  $Therapeutic Activity: 8-22 mins                     Everlean Alstrom. Graylon Good, PT, DPT  06/07/19, 4:55 PM

## 2019-06-07 NOTE — Progress Notes (Signed)
*  PRELIMINARY RESULTS* Echocardiogram 2D Echocardiogram has been performed.  Sherrie Sport 06/07/2019, 10:18 AM

## 2019-06-08 LAB — BASIC METABOLIC PANEL
Anion gap: 10 (ref 5–15)
BUN: 16 mg/dL (ref 8–23)
CO2: 27 mmol/L (ref 22–32)
Calcium: 9 mg/dL (ref 8.9–10.3)
Chloride: 99 mmol/L (ref 98–111)
Creatinine, Ser: 0.58 mg/dL (ref 0.44–1.00)
GFR calc Af Amer: >60 mL/min (ref >60)
GFR calc non Af Amer: >60 mL/min (ref >60)
Glucose, Bld: 105 mg/dL — ABNORMAL HIGH (ref 70–99)
Potassium: 4 mmol/L (ref 3.5–5.1)
Sodium: 136 mmol/L (ref 135–145)

## 2019-06-08 LAB — ECHOCARDIOGRAM COMPLETE
Height: 64 in
Weight: 1720 oz

## 2019-06-08 LAB — CBC
HCT: 30.7 % — ABNORMAL LOW (ref 36.0–46.0)
Hemoglobin: 10.1 g/dL — ABNORMAL LOW (ref 12.0–15.0)
MCH: 28.9 pg (ref 26.0–34.0)
MCHC: 32.9 g/dL (ref 30.0–36.0)
MCV: 88 fL (ref 80.0–100.0)
Platelets: 327 10*3/uL (ref 150–400)
RBC: 3.49 MIL/uL — ABNORMAL LOW (ref 3.87–5.11)
RDW: 12.9 % (ref 11.5–15.5)
WBC: 7.2 10*3/uL (ref 4.0–10.5)
nRBC: 0 % (ref 0.0–0.2)

## 2019-06-08 MED ORDER — HALOPERIDOL LACTATE 5 MG/ML IJ SOLN: 5.0000 mg | Freq: Four times a day (QID) | INTRAMUSCULAR | Status: DC | PRN

## 2019-06-08 MED ORDER — METOPROLOL TARTRATE 25 MG PO TABS: 12.5000 mg | ORAL_TABLET | Freq: Two times a day (BID) | ORAL | 0 refills | Status: AC

## 2019-06-08 MED ORDER — TRAZODONE HCL 100 MG PO TABS: 100.0000 mg | ORAL_TABLET | ORAL | Status: DC

## 2019-06-08 NOTE — TOC Transition Note (Signed)
Transition of Care [facility]) - CM/SW Discharge Note   Patient Details  Name: Suzanne Russo MRN: [id number] Date of Birth: 03/02/1932  Transition of Care [facility]) CM/SW Contact:  Latanya Maudlin, RN Phone Number: 06/08/2019, 10:33 AM   Clinical Narrative:   Patient to be discharged per MD order. Orders in place for home health services. Patient is active with [facility] home health. Notified [place] for need of resumption of care. Spoke with patient spouse who refuses SNF. He has 24 hour caregivers. All needed DME including hospital bed, wheelchair, etc. EMS was used for transport last admission, spouse prefers to transfer themselves but use EMS if needed     Final next level of care: [place] Barriers to Discharge: No Barriers Identified   Patient Goals and CMS Choice   CMS Medicare.gov Compare Post Acute Care list provided to:: Patient Choice offered to / list presented to : Spouse  Discharge Placement                       Discharge Plan and Services                          HH Arranged: RN, Nurse's Aide [facility] Agency: [facility] Date [place]: 06/08/19 Time [place]: 1032 Representative spoke with at [place]: [place]ue ([place]) Interventions     Readmission Risk Interventions Readmission Risk Prevention Plan 03/22/2019  Transportation Screening Complete  PCP or Specialist Appt within 3-5 Days Complete  HRI or [place] Complete  Social Work Consult for [place] Planning/Counseling Complete  Palliative Care Screening Complete  Medication Review Press photographer) Complete  Some recent data might be hidden

## 2019-06-08 NOTE — Progress Notes (Signed)
Discharged to her home with her daughter. She lives with her husband and has 24 hour caregivers.  Prescription sent to [facility].  Daughter will make follow up appointments

## 2019-06-08 NOTE — Discharge Instructions (Signed)
Atrial Fibrillation ° °Atrial fibrillation is a type of heartbeat that is irregular or fast (rapid). If you have this condition, your heart beats without any order. This makes it hard for your heart to pump blood in a normal way. Having this condition gives you more risk for stroke, heart failure, and other heart problems. °Atrial fibrillation may start all of a sudden and then stop on its own, or it may become a long-lasting problem. °What are the causes? °This condition may be caused by heart conditions, such as: °· High blood pressure. °· Heart failure. °· Heart valve disease. °· Heart surgery. °Other causes include: °· Pneumonia. °· Obstructive sleep apnea. °· Lung cancer. °· Thyroid disease. °· Drinking too much alcohol. °Sometimes the cause is not known. °What increases the risk? °You are more likely to develop this condition if: °· You smoke. °· You are older. °· You have diabetes. °· You are overweight. °· You have a family history of this condition. °· You exercise often and hard. °What are the signs or symptoms? °Common symptoms of this condition include: °· A feeling like your heart is beating very fast. °· Chest pain. °· Feeling short of breath. °· Feeling light-headed or weak. °· Getting tired easily. °Follow these instructions at home: °Medicines °· Take over-the-counter and prescription medicines only as told by your doctor. °· If your doctor gives you a blood-thinning medicine, take it exactly as told. Taking too much of it can cause bleeding. Taking too little of it does not protect you against clots. Clots can cause a stroke. °Lifestyle ° °  ° °· Do not use any tobacco products. These include cigarettes, chewing tobacco, and e-cigarettes. If you need help quitting, ask your doctor. °· Do not drink alcohol. °· Do not drink beverages that have caffeine. These include coffee, soda, and tea. °· Follow diet instructions as told by your doctor. °· Exercise regularly as told by your doctor. °General  instructions °· If you have a condition that causes breathing to stop for a short period of time (apnea), treat it as told by your doctor. °· Keep a healthy weight. Do not use diet pills unless your doctor says they are safe for you. Diet pills may make heart problems worse. °· Keep all follow-up visits as told by your doctor. This is important. °Contact a doctor if: °· You notice a change in the speed, rhythm, or strength of your heartbeat. °· You are taking a blood-thinning medicine and you see more bruising. °· You get tired more easily when you move or exercise. °· You have a sudden change in weight. °Get help right away if: ° °· You have pain in your chest or your belly (abdomen). °· You have trouble breathing. °· You have blood in your vomit, poop, or pee (urine). °· You have any signs of a stroke. "BE FAST" is an easy way to remember the main warning signs: °? B - Balance. Signs are dizziness, sudden trouble walking, or loss of balance. °? E - Eyes. Signs are trouble seeing or a change in how you see. °? F - Face. Signs are sudden weakness or loss of feeling in the face, or the face or eyelid drooping on one side. °? A - Arms. Signs are weakness or loss of feeling in an arm. This happens suddenly and usually on one side of the body. °? S - Speech. Signs are sudden trouble speaking, slurred speech, or trouble understanding what people say. °? T - Time.   Time to call emergency services. Write down what time symptoms started. °· You have other signs of a stroke, such as: °? A sudden, very bad headache with no known cause. °? Feeling sick to your stomach (nausea). °? Throwing up (vomiting). °? Jerky movements you cannot control (seizure). °These symptoms may be an emergency. Do not wait to see if the symptoms will go away. Get medical help right away. Call your local emergency services (911 in the [place] Do not drive yourself to the hospital. °Summary °· Atrial fibrillation is a type of heartbeat that is irregular  or fast (rapid). °· You are at higher risk of this condition if you smoke, are older, have diabetes, or are overweight. °· Follow your doctor's instructions about medicines, diet, exercise, and follow-up visits. °· Get help right away if you think that you have signs of a stroke. °This information is not intended to replace advice given to you by your health care provider. Make sure you discuss any questions you have with your health care provider. °Document Released: 07/04/2008 Document Revised: 11/29/2017 Document Reviewed: 11/16/2017 °Elsevier Patient Education © 2020 Elsevier Inc. ° °

## 2019-06-08 NOTE — Progress Notes (Signed)
[facility] Encounter Note  Patient: Suzanne Russo / Admit Date: 06/06/2019 / Date of Encounter: 06/08/2019, 6:23 AM   Subjective: Patient with significant amount of sundowning throughout the evening and difficulty with agitation.  No current evidence of concerns of chest discomfort although likely secondary to sternal fracture.  Troponin level consistent with demand ischemia rather than acute coronary syndrome.  Patient has had paroxysmal nonvalvular atrial fibrillation with controlled ventricular rate throughout the evening and no other significant rhythm disturbance.  Low-dose beta-blocker has helped with heart rate control.  No other significant hemodynamic concerns today  Review of Systems: Positive for: Agitation Not assess  Objective: Telemetry: Atrial fibrillation with controlled ventricular rate Physical Exam: Blood pressure (!) 151/76, pulse 72, temperature 98.4 F (36.9 C), temperature source Oral, resp. rate 18, height 5\' 4"  (1.626 m), weight 48.8 kg, SpO2 97 %. Body mass index is 18.45 kg/m. General: Well developed, well nourished, in no acute distress. Head: Normocephalic, atraumatic, sclera non-icteric, no xanthomas, nares are without discharge. Neck: No apparent masses Lungs: Normal respirations with no wheezes, no rhonchi, no rales , no crackles   Heart: irregular rate and rhythm, normal S1 S2, no murmur, no rub, no gallop, PMI is normal size and placement, carotid upstroke normal without bruit, jugular venous pressure normal Abdomen: Soft, non-tender, non-distended with normoactive bowel sounds. No hepatosplenomegaly. Abdominal aorta is normal size without bruit Extremities: No edema, no clubbing, no cyanosis, no ulcers,  Peripheral: 2+ radial, 2+ femoral, 2+ dorsal pedal pulses Neuro: Not alert and oriented. Moves all extremities spontaneously. Psych: Does not responds to questions appropriately with a normal affect.   Intake/Output Summary (Last  24 hours) at 06/08/2019 0623 Last data filed at 06/07/2019 1852 Gross per 24 hour  Intake 723 ml  Output -  Net 723 ml    Inpatient Medications:  . acetaminophen  1,000 mg Oral Q8H  . calcium-vitamin D  1 tablet Oral BID  . carbamazepine  400 mg Oral BID   And  . carbamazepine  100 mg Oral BID  . enoxaparin (LOVENOX) injection  40 mg Subcutaneous Q24H  . levothyroxine  100 mcg Oral Q0600  . loratadine  10 mg Oral Daily   And  . pseudoephedrine  120 mg Oral BID  . metoprolol tartrate  12.5 mg Oral BID  . OLANZapine  5 mg Oral BID  . pantoprazole  40 mg Oral Daily  . sodium chloride flush  3 mL Intravenous Once  . sodium chloride flush  3 mL Intravenous Q12H  . traZODone  100 mg Oral NOW   Infusions:  . sodium chloride      Labs: Recent Labs    06/06/19 1031 06/06/19 1330 06/07/19 0935  NA 137  --  137  K 4.3  --  3.9  CL 102  --  102  CO2 27  --  26  GLUCOSE 105*  --  141*  BUN 20  --  17  CREATININE 0.44  --  0.55  CALCIUM 8.8*  --  8.7*  MG  --  2.4  --    Recent Labs    06/06/19 1330  AST 18  ALT 16  ALKPHOS 83  BILITOT 0.2*  PROT 7.1  ALBUMIN 3.7   Recent Labs    06/06/19 1031 06/07/19 0935  WBC 10.4 5.6  HGB 9.8* 9.3*  HCT 31.0* 29.0*  MCV 91.7 90.1  PLT 304 297   No results for input(s): CKTOTAL, CKMB, TROPONINI in  the last 72 hours. Invalid input(s): POCBNP No results for input(s): HGBA1C in the last 72 hours.   Weights: Filed Weights   06/06/19 1019 06/06/19 2058  Weight: 48.1 kg 48.8 kg     Radiology/Studies:  Dg Chest 2 View  Result Date: 06/06/2019 CLINICAL DATA:  Chest pain EXAM: CHEST - 2 VIEW COMPARISON:  03/19/2019, 02/10/2019 FINDINGS: The heart size and mediastinal contours are stable. Calcific aortic knob. Chronic coarsened interstitial markings bilaterally. No focal airspace consolidation. No pleural effusion or pneumothorax. Multiple chronic right-sided rib fractures. Redemonstration of deformity of the left humeral head  and neck. IMPRESSION: No active cardiopulmonary disease. Electronically Signed   By: Duanne GuessNicholas  Plundo M.D.   On: 06/06/2019 11:17   Ct Angio Chest Pe W And/or Wo Contrast  Result Date: 06/06/2019 CLINICAL DATA:  Chest pain EXAM: CT ANGIOGRAPHY CHEST WITH CONTRAST TECHNIQUE: Multidetector CT imaging of the chest was performed using the standard protocol during bolus administration of intravenous contrast. Multiplanar CT image reconstructions and MIPs were obtained to evaluate the vascular anatomy. CONTRAST:  75mL OMNIPAQUE IOHEXOL 350 MG/ML SOLN COMPARISON:  03/18/2017 FINDINGS: Cardiovascular: Heart size is upper limits of normal. No pericardial effusion. Satisfactory opacification of the pulmonary arteries. No filling defects to the segmental branch level no evidence of pulmonary embolism. Mild atherosclerotic calcifications of the aorta and coronary arteries. Thoracic aorta is tortuous. Mediastinum/Nodes: No enlarged mediastinal, hilar, or axillary lymph nodes. Thyroid gland, trachea, and esophagus demonstrate no significant findings. Lungs/Pleura: No focal airspace consolidation, pleural effusion, or pneumothorax. Mild bibasilar linear scarring/atelectasis. Upper Abdomen: No acute abnormality. Musculoskeletal: Minimally displaced fracture of the sternal body superiorly with associated swelling of the overlying chest wall (series 8, image 43). There is a healed fracture more inferiorly of the sternum and an additional healed manubrial fracture. Multiple healed bilateral rib fractures. Levocurvature of the upper thoracic spine. Degenerative changes of the visualized lower cervical spine. Review of the MIP images confirms the above findings. IMPRESSION: 1. Negative for pulmonary embolism. 2. Acute, minimally displaced fracture of the sternal body. 3. Lungs are clear. Electronically Signed   By: Duanne GuessNicholas  Plundo M.D.   On: 06/06/2019 15:25   Ct Cervical Spine Wo Contrast  Result Date: 06/06/2019 CLINICAL  DATA:  C-spine stenosis. Patient reports right-sided shoulder, chest, and upper back/neck pain. EXAM: CT CERVICAL SPINE WITHOUT CONTRAST TECHNIQUE: Multidetector CT imaging of the cervical spine was performed without intravenous contrast. Multiplanar CT image reconstructions were also generated. COMPARISON:  CT of the cervical spine 12/06/2018 FINDINGS: Alignment: There is straightening of the normal cervical lordosis. Grade 1 anterolisthesis at C5-6 measures 5 mm. Grade 1 anterolisthesis at C6-7 measures 2.5 mm. There is slight anterolisthesis at T1-2 greater than T2-3. Mild rightward curvature is present in the cervical spine. This compensates for leftward curvature in the upper thoracic spine. Skull base and vertebrae: Advanced degenerative changes are present at C1-2 without significant change. Left greater than right lateral mass articulation degenerative changes are noted. There is chronic loss of height at C6 and along the superior endplate of C7. There is chronic loss of height along the inferior endplate of C5. There is no significant change. Vertebral body heights are otherwise maintained. Soft tissues and spinal canal: Atherosclerotic calcifications are again noted at the left carotid bifurcation. Soft tissues the neck are otherwise unremarkable. Disc levels: Advanced facet hypertrophy and uncovertebral changes contribute to foraminal narrowing at C5-6 and C6-7, left greater than right. This is similar the prior exam. No new stenosis or significant disc  disease is present. Upper chest: Mild dependent atelectasis is present. Thoracic inlet is normal. There is some scarring at the lung apices bilaterally. IMPRESSION: 1. Similar appearance of advanced spondylosis in the cervical spine. 2. Advanced degenerative changes at C1-2 with left greater than right lateral mass articular disease contributing to left foraminal stenosis. 3. Grade 1 anterolisthesis at C5-6 and C6-7 advanced facet hypertrophy leads to left  greater than right foraminal stenosis at both levels. 4. Chronic loss of vertebral body height at C6 associated with the endplate degenerative changes. 5. No acute abnormalities. Electronically Signed   By: Marin Roberts M.D.   On: 06/06/2019 15:29     Assessment and Recommendation  85 y.o. female with significant dementia and now significant chest discomfort multifactorial in nature but mainly secondary to sternal fracture of unknown etiology and recent periods of fall with atrial fibrillation with a variable heart rate and no current evidence of congestive heart failure or myocardial infarction 1.  Continue low-dose beta-blocker for heart rate control of atrial fibrillation which appears to be stable at this time 2.  No anticoagulation due to significant risk of fall bleeding complications and dementia 3.  No further cardiac diagnostics necessary at this time 4.  Okay for discharge home from cardiac standpoint with current medical regimen of metoprolol to reduce dementia and sundowning with follow-up in office next week for further adjustments of medication as necessary  Signed, Arnoldo Hooker M.D. FACC

## 2019-06-09 ENCOUNTER — Telehealth: Payer: Self-pay

## 2019-06-09 ENCOUNTER — Other Ambulatory Visit: Payer: Self-pay | Admitting: Neurology

## 2019-06-09 MED ORDER — OLANZAPINE 2.5 MG PO TABS: ORAL_TABLET | ORAL | 3 refills | Status: DC

## 2019-06-09 NOTE — Telephone Encounter (Signed)
Spoke with husband to schedule visit with Palliative Care. Visit schedule via Telehealth for Friday 06/13/2019.

## 2019-06-09 NOTE — Discharge Summary (Signed)
[facility] [place] [facility]   PATIENT NAME: Suzanne Russo    MR#:  [id number]  DATE OF BIRTH:  [birth year]  DATE OF ADMISSION:  06/06/2019   ADMITTING PHYSICIAN: Shaune Pollack, MD  DATE OF DISCHARGE: 06/08/2019  3:00 PM  PRIMARY CARE PHYSICIAN: Marisue Ivan, MD   ADMISSION DIAGNOSIS:  Chest wall pain [R07.89] New onset atrial fibrillation (HCC) [I48.91] Closed fracture of body of sternum, initial encounter [S22.22XA] DISCHARGE DIAGNOSIS:  Active Problems:   Atrial fibrillation with rapid ventricular response (HCC)  SECONDARY DIAGNOSIS:   Past Medical History:  Diagnosis Date  . Carpal tunnel syndrome   . Concussion with no loss of consciousness 08/26/2015  . Degenerative arthritis   . Dementia (HCC) 02/13/2019  . DVT (deep venous thrombosis) (HCC)   . GERD (gastroesophageal reflux disease)   . Hemorrhoids   . Hypothyroidism   . Memory difficulty 07/16/2014  . Occipital neuralgia   . Occipital neuralgia of left side 12/15/2014  . Osteoporosis   . Pelvic fracture (HCC)   . Peptic ulcer disease   . Postconcussive syndrome 12/20/2018  . Right clavicle fracture   . Trigeminal neuralgia   . Venous insufficiency    HOSPITAL COURSE:  85 year old female with hypertension, peripheral vascular disease, left ventricular hypertrophy admitted with new onset atrial fibrillation  * Paroxysmal A. fib with RVR. -  spontaneously converted to normal rhythm - multifactorial in nature with a significant fall risk and no evidence of advanced heart block or cardiac causes of syncope - Continue low-dose metoprolol for heart rate control and maintenance of normal sinus rhythm  -No use of anticoagulation at this time due to isolated incident of atrial fibrillation at this time and concerns for significant fall risk and dementia per cardio  * Sternal fracture. Pain well controlled at this time  * Frequent fall and generalized weakness. HHPT  * Anemia of chronic  disease. Stable.  They refuse SNF DISCHARGE CONDITIONS:  fair CONSULTS OBTAINED:   DRUG ALLERGIES:   Allergies  Allergen Reactions  . Aricept [Donepezil]     GI upset  . Citalopram Other (See Comments)    Caused bad dreams  . Sulfa Antibiotics Rash  . Tetracycline Rash   DISCHARGE MEDICATIONS:   Allergies as of 06/08/2019      Reactions   Aricept [donepezil]    GI upset   Citalopram Other (See Comments)   Caused bad dreams   Sulfa Antibiotics Rash   Tetracycline Rash      Medication List    TAKE these medications   B-12 PO Take 2,500 mcg by mouth daily. Notes to patient: None given today   bisacodyl 5 MG EC tablet Commonly known as: DULCOLAX Take 1 tablet (5 mg total) by mouth daily as needed for moderate constipation.   Caltrate 600+D Plus Minerals 600-800 MG-UNIT Tabs Take 1 tablet by mouth 2 (two) times daily.   carbamazepine 200 MG 12 hr capsule Commonly known as: Carbatrol Take 1 capsule (200 mg total) by mouth 2 (two) times daily.   carbamazepine 300 MG 12 hr capsule Commonly known as: CARBATROL TAKE 1 CAPSULE BY MOUTH TWICE DAILY   celecoxib 200 MG capsule Commonly known as: CELEBREX Take 200 mg by mouth daily.   hydrocortisone 25 MG suppository Commonly known as: ANUSOL-HC Place 25 mg rectally 2 (two) times daily as needed for hemorrhoids or anal itching.   levothyroxine 100 MCG tablet Commonly known as: SYNTHROID Take 100 mcg by mouth daily before  breakfast.   loratadine-pseudoephedrine 10-240 MG 24 hr tablet Commonly known as: CLARITIN-D 24-hour Take 1 tablet by mouth daily.   meclizine 25 MG tablet Commonly known as: ANTIVERT Take 25 mg by mouth every 6 (six) hours as needed.   metoprolol tartrate 25 MG tablet Commonly known as: LOPRESSOR Take 0.5 tablets (12.5 mg total) by mouth 2 (two) times daily. Notes to patient: Check your pulse before taking.  If your pulse is below 60 skip the dose.   OLANZapine 5 MG tablet Commonly  known as: ZyPREXA Take 1 tablet (5 mg total) by mouth 2 (two) times a day.   omeprazole 20 MG capsule Commonly known as: PRILOSEC Take 20 mg by mouth daily.   senna-docusate 8.6-50 MG tablet Commonly known as: Senokot-S Take 1 tablet by mouth at bedtime as needed for mild constipation.   triamcinolone cream 0.1 % Commonly known as: KENALOG Apply 1 application topically 2 (two) times daily.   TYLENOL 500 MG tablet Generic drug: acetaminophen Take 1,000 mg by mouth every 8 (eight) hours.   zolendronic acid 4 MG/5ML injection Commonly known as: ZOMETA Inject 4 mg into the vein once. Last received inj 11/07/17      DISCHARGE INSTRUCTIONS:   DIET:  Regular diet DISCHARGE CONDITION:  Good ACTIVITY:  Activity as tolerated OXYGEN:  Home Oxygen: No.  Oxygen Delivery: room air DISCHARGE LOCATION:  Home with HHPT and palliative care - consider transition to Hospice   If you experience worsening of your admission symptoms, develop shortness of breath, life threatening emergency, suicidal or homicidal thoughts you must seek medical attention immediately by calling 911 or calling your MD immediately  if symptoms less severe.  You Must read complete instructions/literature along with all the possible adverse reactions/side effects for all the Medicines you take and that have been prescribed to you. Take any new Medicines after you have completely understood and accpet all the possible adverse reactions/side effects.   Please note  You were cared for by a hospitalist during your hospital stay. If you have any questions about your discharge medications or the care you received while you were in the hospital after you are discharged, you can call the unit and asked to speak with the hospitalist on call if the hospitalist that took care of you is not available. Once you are discharged, your primary care physician will handle any further medical issues. Please note that NO REFILLS for any  discharge medications will be authorized once you are discharged, as it is imperative that you return to your primary care physician (or establish a relationship with a primary care physician if you do not have one) for your aftercare needs so that they can reassess your need for medications and monitor your lab values.    On the day of Discharge:  VITAL SIGNS:  Blood pressure 101/70, pulse (!) 57, temperature (!) 97.5 F (36.4 C), temperature source Oral, resp. rate 14, height 5\' 4"  (1.626 m), weight 48.8 kg, SpO2 98 %. PHYSICAL EXAMINATION:  GENERAL:  85 y.o.-year-old patient lying in the bed with no acute distress.  EYES: Pupils equal, round, reactive to light and accommodation. No scleral icterus. Extraocular muscles intact.  HEENT: Head atraumatic, normocephalic. Oropharynx and nasopharynx clear.  NECK:  Supple, no jugular venous distention. No thyroid enlargement, no tenderness.  LUNGS: Normal breath sounds bilaterally, no wheezing, rales,rhonchi or crepitation. No use of accessory muscles of respiration.  CARDIOVASCULAR: S1, S2 normal. No murmurs, rubs, or gallops.  ABDOMEN: Soft, non-tender,  non-distended. Bowel sounds present. No organomegaly or mass.  EXTREMITIES: No pedal edema, cyanosis, or clubbing.  NEUROLOGIC: Cranial nerves II through XII are intact. Muscle strength 5/5 in all extremities. Sensation intact. Gait not checked.  PSYCHIATRIC: The patient is alert and oriented x 3.  SKIN: No obvious rash, lesion, or ulcer.  DATA REVIEW:   CBC Recent Labs  Lab 06/08/19 0857  WBC 7.2  HGB 10.1*  HCT 30.7*  PLT 327    Chemistries  Recent Labs  Lab 06/06/19 1330  06/08/19 0857  NA  --    < > 136  K  --    < > 4.0  CL  --    < > 99  CO2  --    < > 27  GLUCOSE  --    < > 105*  BUN  --    < > 16  CREATININE  --    < > 0.58  CALCIUM  --    < > 9.0  MG 2.4  --   --   AST 18  --   --   ALT 16  --   --   ALKPHOS 83  --   --   BILITOT 0.2*  --   --    < > = values in  this interval not displayed.    Follow-up Information    Dion Body, MD. Schedule an appointment as soon as possible for a visit in 1 week(s).   Specialty: Family Medicine Contact information: [address] [place] [address] [phone]           Management plans discussed with the patient, family and they are in agreement.  CODE STATUS: Prior   TOTAL TIME TAKING CARE OF THIS PATIENT: 45 minutes.    Max Sane M.D on 06/09/2019 at 6:19 PM  Between 7am to 6pm - Pager - [phone]  After 6pm go to [email or web address] - Proofreader  [facility]  Office  [phone]  CC: Primary care physician; Dion Body, MD   Note: This dictation was prepared with Dragon dictation along with smaller phrase technology. Any transcriptional errors that result from this process are unintentional.

## 2019-06-13 ENCOUNTER — Other Ambulatory Visit: Payer: Medicare Other | Admitting: Adult Health Nurse Practitioner

## 2019-06-13 ENCOUNTER — Other Ambulatory Visit: Payer: Self-pay

## 2019-06-13 DIAGNOSIS — Z515 Encounter for palliative care: Secondary | ICD-10-CM

## 2019-06-13 NOTE — Progress Notes (Signed)
[facility] Consult Note Telephone: [phone]  Fax: [phone]  PATIENT NAME: Suzanne Russo DOB: 28-Feb-1932 MRN: [id number]  PRIMARY CARE PROVIDER:   Marisue IvanLinthavong, Kanhka, MD  REFERRING PROVIDER:  Marisue IvanLinthavong, Kanhka, MD [phone] [facility] [place],  [place] [id number]  RESPONSIBLE PARTY:   Bobetta LimeJack Dupre, husband [phone]  Due to the COVID-19 crisis, this visit was done via telemedicine and it was initiated and consent by this patient and or family. Video-audio (telehealth) contact was unable to be done due to technical barriers from the patient's side.    RECOMMENDATIONS and PLAN:  1.  Advanced care planning.  Patient is a DNR.  Husband states that he wants to keep her home and has 24/7 caregivers.  Answered husband's questions about palliative care.  Have in person visit on September 15.  2.  Dementia.  Patient ambulates slowly with a walker and has stand by assist.  She has multiple falls.  Husband states that she has had 3 serious falls this year in which she has cracked her skull, fractured her left humerus, and fractured her right hip.  She is able to feed herself and has nor reported changes in appetite or weight.  She does get agitated at times and is on Zyprexa 5 mg in morning and at night with a 2.5 mg dose in the afternoon.  This seems to be helping but she will still occasionally have some agitation.  Does not have anything PRN for anxiety/agitation.  Continue to monitor for increased agitation and see if she needs anything else to help with it.  If agitation increases could add PRN xanax or ativan to help.    I spent 40 minutes providing this consultation,  from 11:00 to 11:40. More than 50% of the time in this consultation was spent coordinating communication.   HISTORY OF PRESENT ILLNESS:  Suzanne Pepperancy T Cumbie is a 85 y.o. year old female with multiple medical problems including dementia, recurrent falls, HTN, PVD,  trigeminal neuralgia. Palliative Care was asked to help address goals of care.   CODE STATUS: DNR  PPS: 50% HOSPICE ELIGIBILITY/DIAGNOSIS: TBD  PAST MEDICAL HISTORY:  Past Medical History:  Diagnosis Date  . Carpal tunnel syndrome   . Concussion with no loss of consciousness 08/26/2015  . Degenerative arthritis   . Dementia (HCC) 02/13/2019  . DVT (deep venous thrombosis) (HCC)   . GERD (gastroesophageal reflux disease)   . Hemorrhoids   . Hypothyroidism   . Memory difficulty 07/16/2014  . Occipital neuralgia   . Occipital neuralgia of left side 12/15/2014  . Osteoporosis   . Pelvic fracture (HCC)   . Peptic ulcer disease   . Postconcussive syndrome 12/20/2018  . Right clavicle fracture   . Trigeminal neuralgia   . Venous insufficiency     SOCIAL HX:  Social History   Tobacco Use  . Smoking status: Former Smoker    Packs/day: 1.00    Years: 45.00    Pack years: 45.00    Types: Cigarettes  . Smokeless tobacco: Never Used  Substance Use Topics  . Alcohol use: Yes    Alcohol/week: 7.0 standard drinks    Types: 7 Glasses of wine per week    Comment: wine on occasion    ALLERGIES:  Allergies  Allergen Reactions  . Aricept [Donepezil]     GI upset  . Citalopram Other (See Comments)    Caused bad dreams  . Sulfa Antibiotics Rash  .  Tetracycline Rash     PERTINENT MEDICATIONS:  Outpatient Encounter Medications as of 06/13/2019  Medication Sig  . acetaminophen (TYLENOL) 500 MG tablet Take 1,000 mg by mouth every 8 (eight) hours.   . bisacodyl (DULCOLAX) 5 MG EC tablet Take 1 tablet (5 mg total) by mouth daily as needed for moderate constipation.  . Calcium Carbonate-Vit D-Min (CALTRATE 600+D PLUS MINERALS) 600-800 MG-UNIT TABS Take 1 tablet by mouth 2 (two) times daily.  . carbamazepine (CARBATROL) 200 MG 12 hr capsule Take 1 capsule (200 mg total) by mouth 2 (two) times daily.  . carbamazepine (CARBATROL) 300 MG 12 hr capsule TAKE 1 CAPSULE BY MOUTH TWICE DAILY  .  celecoxib (CELEBREX) 200 MG capsule Take 200 mg by mouth daily.  . Cyanocobalamin (B-12 PO) Take 2,500 mcg by mouth daily.   . hydrocortisone (ANUSOL-HC) 25 MG suppository Place 25 mg rectally 2 (two) times daily as needed for hemorrhoids or anal itching.   . levothyroxine (SYNTHROID, LEVOTHROID) 100 MCG tablet Take 100 mcg by mouth daily before breakfast.  . loratadine-pseudoephedrine (CLARITIN-D 24-HOUR) 10-240 MG 24 hr tablet Take 1 tablet by mouth daily.  . meclizine (ANTIVERT) 25 MG tablet Take 25 mg by mouth every 6 (six) hours as needed.  . metoprolol tartrate (LOPRESSOR) 25 MG tablet Take 0.5 tablets (12.5 mg total) by mouth 2 (two) times daily.  Marland Kitchen OLANZapine (ZYPREXA) 2.5 MG tablet 1 tablet in the afternoon if needed for agitation  . OLANZapine (ZYPREXA) 5 MG tablet Take 1 tablet (5 mg total) by mouth 2 (two) times a day.  Marland Kitchen omeprazole (PRILOSEC) 20 MG capsule Take 20 mg by mouth daily.  Marland Kitchen senna-docusate (SENOKOT-S) 8.6-50 MG tablet Take 1 tablet by mouth at bedtime as needed for mild constipation.  . triamcinolone cream (KENALOG) 0.1 % Apply 1 application topically 2 (two) times daily.   Marland Kitchen zolendronic acid (ZOMETA) 4 MG/5ML injection Inject 4 mg into the vein once. Last received inj 11/07/17   No facility-administered encounter medications on file as of 06/13/2019.      Dedrick Heffner Jenetta Downer, NP

## 2019-06-23 ENCOUNTER — Ambulatory Visit: Payer: Medicare Other | Admitting: Neurology

## 2019-06-23 ENCOUNTER — Encounter: Payer: Self-pay | Admitting: Neurology

## 2019-06-23 ENCOUNTER — Other Ambulatory Visit: Payer: Self-pay

## 2019-06-23 VITALS — BP 128/84 | HR 64 | Temp 98.6°F

## 2019-06-23 DIAGNOSIS — S060X0A Concussion without loss of consciousness, initial encounter: Secondary | ICD-10-CM | POA: Diagnosis not present

## 2019-06-23 DIAGNOSIS — F0781 Postconcussional syndrome: Secondary | ICD-10-CM

## 2019-06-23 DIAGNOSIS — G500 Trigeminal neuralgia: Secondary | ICD-10-CM | POA: Diagnosis not present

## 2019-06-23 DIAGNOSIS — R269 Unspecified abnormalities of gait and mobility: Secondary | ICD-10-CM

## 2019-06-23 DIAGNOSIS — F0391 Unspecified dementia with behavioral disturbance: Secondary | ICD-10-CM | POA: Diagnosis not present

## 2019-06-23 HISTORY — DX: Unspecified abnormalities of gait and mobility: R26.9

## 2019-06-23 MED ORDER — OLANZAPINE 5 MG PO TABS: ORAL_TABLET | ORAL | 3 refills | Status: DC

## 2019-06-23 MED ORDER — OLANZAPINE 2.5 MG PO TABS: ORAL_TABLET | ORAL | 3 refills | Status: DC

## 2019-06-23 NOTE — Progress Notes (Signed)
Reason for visit: Memory disturbance, concussion, dementia  Suzanne Russo is an 85 y.o. female  History of present illness:  Suzanne Russo is an 85 year old right-handed white female with a history of a fall with concussion and subarachnoid blood, she had a significant worsening of mental status following this, but she has partially recovered from that.  She has extensive small vessel disease by MRI of the brain, she has a gait disorder and dementia associated with this.  She does have a history of trigeminal neuralgia.  She has required Zyprexa for agitation.  Since last seen she had a fall around 19 March 2019 with a right hip fracture, she was in the hospital on 29 May 2019 with atrial fibrillation.  The decision was made not to put her on anticoagulant therapy since she has a dementia and she has fallen multiple times.  She will walk some with a walker, she now has 24/7 supervision.  She will have hallucinations off and on.  She spends a good portion of the day sleeping, there is some sundowning issues in the afternoon and she will be somewhat restless throughout the night.  She is on Zyprexa taking 5 mg twice daily and 2.5 mg around 4 PM.  She returns for an evaluation.  Past Medical History:  Diagnosis Date  . Carpal tunnel syndrome   . Concussion with no loss of consciousness 08/26/2015  . Degenerative arthritis   . Dementia ([place]) 02/13/2019  . DVT (deep venous thrombosis) ([place])   . GERD (gastroesophageal reflux disease)   . Hemorrhoids   . Hypothyroidism   . Memory difficulty 07/16/2014  . Occipital neuralgia   . Occipital neuralgia of left side 12/15/2014  . Osteoporosis   . Pelvic fracture ([place])   . Peptic ulcer disease   . Postconcussive syndrome 12/20/2018  . Right clavicle fracture   . Trigeminal neuralgia   . Venous insufficiency     Past Surgical History:  Procedure Laterality Date  . bilateral cataract surgery    . bilateral knee surgery    . INTRAMEDULLARY (IM) NAIL  INTERTROCHANTERIC Right 03/19/2019   Procedure: INTRAMEDULLARY (IM) NAIL INTERTROCHANTRIC;  Surgeon: Corky Mull, MD;  Location: [facility] ORS;  Service: Orthopedics;  Laterality: Right;  . left hip fracture    . left thumb surgery    . left toe surgery    . NASAL SINUS SURGERY    . TONSILLECTOMY    . VAGINAL HYSTERECTOMY      Family History  Problem Relation Age of Onset  . Heart disease Mother   . COPD Father   . Congestive Heart Failure Brother     Social history:  reports that she has quit smoking. Her smoking use included cigarettes. She has a 45.00 pack-year smoking history. She has never used smokeless tobacco. She reports current alcohol use of about 7.0 standard drinks of alcohol per week. She reports that she does not use drugs.    Allergies  Allergen Reactions  . Aricept [Donepezil]     GI upset  . Citalopram Other (See Comments)    Caused bad dreams  . Sulfa Antibiotics Rash  . Tetracycline Rash    Medications:  Prior to Admission medications   Medication Sig Start Date End Date Taking? Authorizing Provider  acetaminophen (TYLENOL) 500 MG tablet Take 1,000 mg by mouth every 8 (eight) hours.    Yes [provider]  bisacodyl (DULCOLAX) 5 MG EC tablet Take 1 tablet (5 mg total) by  mouth daily as needed for moderate constipation. 03/22/19  Yes Shaune Pollack, MD  Calcium Carbonate-Vit D-Min (CALTRATE 600+D PLUS MINERALS) 600-800 MG-UNIT TABS Take 1 tablet by mouth 2 (two) times daily.   Yes [provider]  carbamazepine (CARBATROL) 200 MG 12 hr capsule Take 1 capsule (200 mg total) by mouth 2 (two) times daily. 03/04/19  Yes York Spaniel, MD  carbamazepine (CARBATROL) 300 MG 12 hr capsule TAKE 1 CAPSULE BY MOUTH TWICE DAILY 03/17/19  Yes York Spaniel, MD  celecoxib (CELEBREX) 200 MG capsule Take 200 mg by mouth daily. 05/17/19  Yes [provider]  Cyanocobalamin (B-12 PO) Take 2,500 mcg by mouth daily.    Yes [provider]   hydrocortisone (ANUSOL-HC) 25 MG suppository Place 25 mg rectally 2 (two) times daily as needed for hemorrhoids or anal itching.    Yes [provider]  levothyroxine (SYNTHROID, LEVOTHROID) 100 MCG tablet Take 100 mcg by mouth daily before breakfast.   Yes [provider]  loratadine-pseudoephedrine (CLARITIN-D 24-HOUR) 10-240 MG 24 hr tablet Take 1 tablet by mouth daily.   Yes [provider]  meclizine (ANTIVERT) 25 MG tablet Take 25 mg by mouth every 6 (six) hours as needed.   Yes [provider]  metoprolol tartrate (LOPRESSOR) 25 MG tablet Take 0.5 tablets (12.5 mg total) by mouth 2 (two) times daily. Patient taking differently: Take 12.5 mg by mouth as needed.  06/08/19  Yes Delfino Lovett, MD  OLANZapine (ZYPREXA) 2.5 MG tablet 1 tablet in the afternoon if needed for agitation 06/09/19  Yes York Spaniel, MD  OLANZapine (ZYPREXA) 5 MG tablet Take 1 tablet (5 mg total) by mouth 2 (two) times a day. 04/08/19  Yes York Spaniel, MD  omeprazole (PRILOSEC) 20 MG capsule Take 20 mg by mouth daily.   Yes [provider]  senna-docusate (SENOKOT-S) 8.6-50 MG tablet Take 1 tablet by mouth at bedtime as needed for mild constipation. 03/22/19  Yes Shaune Pollack, MD  triamcinolone cream (KENALOG) 0.1 % Apply 1 application topically 2 (two) times daily.    Yes [provider]  zolendronic acid (ZOMETA) 4 MG/5ML injection Inject 4 mg into the vein once. Last received inj 11/07/17   Yes [provider]    ROS:  Out of a complete 14 system review of symptoms, the patient complains only of the following symptoms, and all other reviewed systems are negative.  Memory disorder, dementia Hallucinations Walking difficulty  Blood pressure 128/84, pulse 64, temperature 98.6 F (37 C), temperature source Temporal, SpO2 96 %.  Physical Exam  General: The patient is alert and cooperative at the time of the examination.  Skin: No significant  peripheral edema is noted.   Neurologic Exam  Mental status: The patient is alert and oriented x 3 at the time of the examination. The Mini-Mental status examination done today shows a total score of 13/30.   Cranial nerves: Facial symmetry is present. Speech is normal, no aphasia or dysarthria is noted. Extraocular movements are full. Visual fields are full.  Motor: The patient has good strength in all 4 extremities.  Sensory examination: Soft touch sensation is symmetric on the face, arms, and legs.  Coordination: The patient has good finger-nose-finger and heel-to-shin bilaterally.  Gait and station: The patient will lean to the right with sitting, she requires some assistance with standing, she is able to stand but has a tendency to lean backwards, she cannot effectively walk by herself.  Reflexes: Deep  tendon reflexes are symmetric.   Assessment/Plan:  1.  Dementia  2.  History of concussion  3.  History of trigeminal neuralgia  4.  Gait disorder  5.  Extensive small vessel disease  The patient will take 2.5 mg of Zyprexa in the morning and then take the 5 mg tablets at 4 PM and at bedtime.  She may go on melatonin in the evening as well if desired.  The patient has not been able to tolerate medication for memory in the past.  She could not tolerate Aricept due to GI upset.  She will follow-up in about 4 months.  Marlan Palau. Keith Mandela Bello MD 06/23/2019 2:34 PM  [facility] [address] [place], [place] [id number]  Phone [phone] Fax [phone]

## 2019-06-24 ENCOUNTER — Other Ambulatory Visit: Payer: Medicare Other | Admitting: Adult Health Nurse Practitioner

## 2019-06-24 ENCOUNTER — Other Ambulatory Visit: Payer: Self-pay

## 2019-06-24 DIAGNOSIS — Z515 Encounter for palliative care: Secondary | ICD-10-CM

## 2019-06-24 NOTE — Progress Notes (Signed)
[place] Consult Note Telephone: [phone]  Fax: [phone]  PATIENT NAME: Suzanne Russo DOB: August 04, 1932 MRN: [id number]  PRIMARY CARE PROVIDER:   Dion Body, MD  REFERRING PROVIDER:  Dion Body, MD [facility] [place],  [place] [address]  RESPONSIBLE PARTY:   Suzanne Russo, husband [phone]      RECOMMENDATIONS and PLAN:  1.  Advanced care planning.  Patient is a DNR.  Discussed MOST form and answered family's questions.  Wants to go over it and fill out at a later date.  Have appointment in one month to go over further.  Left contact info in case they had any questions.  2.  A-fib.  Daughter states that she has appointment with cardiology tomorrow related to new onset a-fib.  Possibility of having her wear a heart monitor.  She is concerned that it may bother her and that she will keep fooling with it.  States that she was told that her agitation could be leading to the a-fib or the a-fib is causing the agitation.  She is going to go over concerns with cardiologist tomorrow and see what options for treatment may be available.  Not doing anticoagulant therapy due to her fall risk.  Keep follow up with cardiology and discuss recommendations with them.  3.  Dementia. FAST 6b. Patient ambulates slowly with a walker and has stand by assist.  She has multiple falls.  She is able to feed herself and has no reported changes in appetite or weight.  She does get agitated at times and is on Zyprexa 5 mg in morning and at night with a 2.5 mg dose in the afternoon.  Husband states that she has been sleeping more through the day so has changed the Zyprexa to 2.5 mg dose in the morning and then a 5mg  dose at 4pm and at bedtime.  Has just started this so will have to monitor for effectiveness.  Does not have anything PRN for anxiety/agitation.  Continue to monitor for increased agitation and see if she needs  anything else to help with it.  If agitation increases could add PRN xanax or ativan to help.    I spent 60 minutes providing this consultation,  from 1:00 to 2:00. More than 50% of the time in this consultation was spent coordinating communication.   HISTORY OF PRESENT ILLNESS:  Suzanne Russo is a 85 y.o. year old female with multiple medical problems including dementia, recurrent falls, HTN, PVD, trigeminal neuralgia. Palliative Care was asked to help address goals of care.   CODE STATUS: DNR  PPS: 50% HOSPICE ELIGIBILITY/DIAGNOSIS: TBD  PHYSICAL EXAM:   General: NAD, frail appearing, thin Cardiovascular: regular rate and rhythm Pulmonary: lung sounds clear; normal respiratory effort Abdomen: soft, nontender, + bowel sounds GU: no suprapubic tenderness Extremities: no edema Skin: no rashes Neurological: Weakness; A&O to person and place  PAST MEDICAL HISTORY:  Past Medical History:  Diagnosis Date  . Carpal tunnel syndrome   . Concussion with no loss of consciousness 08/26/2015  . Degenerative arthritis   . Dementia ([place]) 02/13/2019  . DVT (deep venous thrombosis) ([place])   . Gait abnormality 06/23/2019  . GERD (gastroesophageal reflux disease)   . Hemorrhoids   . Hypothyroidism   . Memory difficulty 07/16/2014  . Occipital neuralgia   . Occipital neuralgia of left side 12/15/2014  . Osteoporosis   . Pelvic fracture ([place])   . Peptic ulcer disease   .  Postconcussive syndrome 12/20/2018  . Right clavicle fracture   . Trigeminal neuralgia   . Venous insufficiency     SOCIAL HX:  Social History   Tobacco Use  . Smoking status: Former Smoker    Packs/day: 1.00    Years: 45.00    Pack years: 45.00    Types: Cigarettes  . Smokeless tobacco: Never Used  Substance Use Topics  . Alcohol use: Yes    Alcohol/week: 7.0 standard drinks    Types: 7 Glasses of wine per week    Comment: wine on occasion    ALLERGIES:  Allergies  Allergen Reactions  . Aricept [Donepezil]      GI upset  . Citalopram Other (See Comments)    Caused bad dreams  . Sulfa Antibiotics Rash  . Tetracycline Rash     PERTINENT MEDICATIONS:  Outpatient Encounter Medications as of 06/24/2019  Medication Sig  . acetaminophen (TYLENOL) 500 MG tablet Take 1,000 mg by mouth every 8 (eight) hours.   . bisacodyl (DULCOLAX) 5 MG EC tablet Take 1 tablet (5 mg total) by mouth daily as needed for moderate constipation.  . Calcium Carbonate-Vit D-Min (CALTRATE 600+D PLUS MINERALS) 600-800 MG-UNIT TABS Take 1 tablet by mouth 2 (two) times daily.  . carbamazepine (CARBATROL) 200 MG 12 hr capsule Take 1 capsule (200 mg total) by mouth 2 (two) times daily.  . carbamazepine (CARBATROL) 300 MG 12 hr capsule TAKE 1 CAPSULE BY MOUTH TWICE DAILY  . celecoxib (CELEBREX) 200 MG capsule Take 200 mg by mouth daily.  . Cyanocobalamin (B-12 PO) Take 2,500 mcg by mouth daily.   . hydrocortisone (ANUSOL-HC) 25 MG suppository Place 25 mg rectally 2 (two) times daily as needed for hemorrhoids or anal itching.   . levothyroxine (SYNTHROID, LEVOTHROID) 100 MCG tablet Take 100 mcg by mouth daily before breakfast.  . loratadine-pseudoephedrine (CLARITIN-D 24-HOUR) 10-240 MG 24 hr tablet Take 1 tablet by mouth daily.  . meclizine (ANTIVERT) 25 MG tablet Take 25 mg by mouth every 6 (six) hours as needed.  . metoprolol tartrate (LOPRESSOR) 25 MG tablet Take 0.5 tablets (12.5 mg total) by mouth 2 (two) times daily. (Patient taking differently: Take 12.5 mg by mouth as needed. )  . OLANZapine (ZYPREXA) 2.5 MG tablet 1 tablet in the morning  . OLANZapine (ZYPREXA) 5 MG tablet Take at 4 PM and at bedtime  . omeprazole (PRILOSEC) 20 MG capsule Take 20 mg by mouth daily.  Marland Kitchen. senna-docusate (SENOKOT-S) 8.6-50 MG tablet Take 1 tablet by mouth at bedtime as needed for mild constipation.  . triamcinolone cream (KENALOG) 0.1 % Apply 1 application topically 2 (two) times daily.   Marland Kitchen. zolendronic acid (ZOMETA) 4 MG/5ML injection Inject 4 mg  into the vein once. Last received inj 11/07/17   No facility-administered encounter medications on file as of 06/24/2019.       Guenevere Roorda Marlena ClipperK Lean Jaeger, NP

## 2019-07-29 ENCOUNTER — Telehealth: Payer: Self-pay | Admitting: Adult Health Nurse Practitioner

## 2019-07-29 NOTE — Telephone Encounter (Signed)
Called and spoke with husband to reschedule tomorrow's appointment.  We rescheduled appointment for 08/12/2019 at 2pm Tamie Minteer K. Olena Heckle NP

## 2019-08-05 ENCOUNTER — Other Ambulatory Visit: Payer: Self-pay

## 2019-08-05 MED ORDER — OLANZAPINE 5 MG PO TABS: ORAL_TABLET | ORAL | 3 refills | Status: AC

## 2019-08-12 ENCOUNTER — Other Ambulatory Visit: Payer: Medicare Other | Admitting: Adult Health Nurse Practitioner

## 2019-08-12 ENCOUNTER — Other Ambulatory Visit: Payer: Self-pay

## 2019-08-12 DIAGNOSIS — Z515 Encounter for palliative care: Secondary | ICD-10-CM

## 2019-08-12 NOTE — Progress Notes (Signed)
[facility] Consult Note Telephone: [phone]  Fax: [phone]  PATIENT NAME: Suzanne Russo DOB: 1932/06/20 MRN: [id number]  PRIMARY CARE PROVIDER:   Dion Body, MD  REFERRING PROVIDER:  Dion Body, MD [facility] [place],  [place] [address]  RESPONSIBLE PARTY:   Ahmyah Gidley, husband [phone]    RECOMMENDATIONS and PLAN:  1.  Advanced care planning.  Patient is a DNR.  Had left MOST form with husband at last visit.  He has not had a chance to go over this yet and did not have any questions today.  Encouraged to call with any questions.    2.  Dementia. FAST 6b. Patient ambulates slowly with a walker and has stand by assist. She has multiple falls. She is able to feed herself and has no reported changes in appetite or weight. She does get agitated at times and has bad dreams (getting up from the dreams is usually when she falls). She takes Zyprexa 2.5 mg dose in the morning and then a 5mg  dose at 4pm and at bedtime. Does not have anything PRN for anxiety/agitation. Continue to monitor for increased agitation and see if she needs anything else to help with it. She is being seen by Dr. Jannifer Franklin with neurology. Encouraged to let Dr. Jannifer Franklin know of any changes. If agitation increases could add PRN xanax or ativan to help. Could also try seroquel at night to see if this helps with the bad dreams.  3.  Support.  Patient has 24/7 caregivers in the home to help with her care.  Spoke with husband today about the toll taking care of a loved one can cause.  He uses a walker to ambulate and cannot go out for walks like he used to.  Have encouraged taking time for himself at times.  Has been told this by other providers.  He keeps stating that this the "roadmap" of the disease.  Have offered SW services.  Does not want right now but encouraged to let me know when and if he does want their services.     Patient overall stable.  Husband encouraged to call with any changes, questions, or concerns.  Have appointment in March of 2021 as she has several provider appointments coming up in January and February.  I spent 60 minutes providing this consultation,  from 2:00 to 3:00. More than 50% of the time in this consultation was spent coordinating communication.   HISTORY OF PRESENT ILLNESS:  Suzanne Russo is a 85 y.o. year old female with multiple medical problems including dementia, recurrent falls, HTN, PVD, trigeminal neuralgia. Palliative Care was asked to help address goals of care.   CODE STATUS: DNR  PPS: 50% HOSPICE ELIGIBILITY/DIAGNOSIS: TBD  PHYSICAL EXAM:   General: NAD, frail appearing, thin Cardiovascular: regular rate and rhythm Pulmonary: lung sounds clear; normal respiratory effort Abdomen: soft, nontender, + bowel sounds Extremities: no edema, arthritic changes in hands Skin: no rashes Neurological: Weakness; A&O to person and place   PAST MEDICAL HISTORY:  Past Medical History:  Diagnosis Date  . Carpal tunnel syndrome   . Concussion with no loss of consciousness 08/26/2015  . Degenerative arthritis   . Dementia ([place]) 02/13/2019  . DVT (deep venous thrombosis) ([place])   . Gait abnormality 06/23/2019  . GERD (gastroesophageal reflux disease)   . Hemorrhoids   . Hypothyroidism   . Memory difficulty 07/16/2014  . Occipital neuralgia   . Occipital neuralgia  of left side 12/15/2014  . Osteoporosis   . Pelvic fracture ([facility])   . Peptic ulcer disease   . Postconcussive syndrome 12/20/2018  . Right clavicle fracture   . Trigeminal neuralgia   . Venous insufficiency     SOCIAL HX:  Social History   Tobacco Use  . Smoking status: Former Smoker    Packs/day: 1.00    Years: 45.00    Pack years: 45.00    Types: Cigarettes  . Smokeless tobacco: Never Used  Substance Use Topics  . Alcohol use: Yes    Alcohol/week: 7.0 standard drinks    Types: 7 Glasses of wine per week     Comment: wine on occasion    ALLERGIES:  Allergies  Allergen Reactions  . Aricept [Donepezil]     GI upset  . Citalopram Other (See Comments)    Caused bad dreams  . Sulfa Antibiotics Rash  . Tetracycline Rash     PERTINENT MEDICATIONS:  Outpatient Encounter Medications as of 08/12/2019  Medication Sig  . acetaminophen (TYLENOL) 500 MG tablet Take 1,000 mg by mouth every 8 (eight) hours.   . bisacodyl (DULCOLAX) 5 MG EC tablet Take 1 tablet (5 mg total) by mouth daily as needed for moderate constipation.  . Calcium Carbonate-Vit D-Min (CALTRATE 600+D PLUS MINERALS) 600-800 MG-UNIT TABS Take 1 tablet by mouth 2 (two) times daily.  . carbamazepine (CARBATROL) 200 MG 12 hr capsule Take 1 capsule (200 mg total) by mouth 2 (two) times daily.  . carbamazepine (CARBATROL) 300 MG 12 hr capsule TAKE 1 CAPSULE BY MOUTH TWICE DAILY  . celecoxib (CELEBREX) 200 MG capsule Take 200 mg by mouth daily.  . Cyanocobalamin (B-12 PO) Take 2,500 mcg by mouth daily.   . hydrocortisone (ANUSOL-HC) 25 MG suppository Place 25 mg rectally 2 (two) times daily as needed for hemorrhoids or anal itching.   . levothyroxine (SYNTHROID, LEVOTHROID) 100 MCG tablet Take 100 mcg by mouth daily before breakfast.  . loratadine-pseudoephedrine (CLARITIN-D 24-HOUR) 10-240 MG 24 hr tablet Take 1 tablet by mouth daily.  . meclizine (ANTIVERT) 25 MG tablet Take 25 mg by mouth every 6 (six) hours as needed.  . metoprolol tartrate (LOPRESSOR) 25 MG tablet Take 0.5 tablets (12.5 mg total) by mouth 2 (two) times daily. (Patient taking differently: Take 12.5 mg by mouth as needed. )  . OLANZapine (ZYPREXA) 2.5 MG tablet 1 tablet in the morning  . OLANZapine (ZYPREXA) 5 MG tablet Take at 4 PM and at bedtime  . omeprazole (PRILOSEC) 20 MG capsule Take 20 mg by mouth daily.  Marland Kitchen senna-docusate (SENOKOT-S) 8.6-50 MG tablet Take 1 tablet by mouth at bedtime as needed for mild constipation.  . triamcinolone cream (KENALOG) 0.1 % Apply 1  application topically 2 (two) times daily.   Marland Kitchen zolendronic acid (ZOMETA) 4 MG/5ML injection Inject 4 mg into the vein once. Last received inj 11/07/17   No facility-administered encounter medications on file as of 08/12/2019.      Lawton Dollinger Marlena Clipper, NP

## 2019-09-10 ENCOUNTER — Telehealth: Payer: Self-pay | Admitting: Neurology

## 2019-09-10 ENCOUNTER — Other Ambulatory Visit: Payer: Self-pay | Admitting: *Deleted

## 2019-09-10 MED ORDER — OLANZAPINE 2.5 MG PO TABS: ORAL_TABLET | ORAL | 3 refills | Status: AC

## 2019-09-10 NOTE — Telephone Encounter (Signed)
Sent refill as requested.

## 2019-09-10 NOTE — Telephone Encounter (Signed)
Pt husband has called for a refill on the OLANZapine (ZYPREXA) 2.5 MG tablet [facility]

## 2019-09-24 ENCOUNTER — Other Ambulatory Visit: Payer: Self-pay

## 2019-09-24 ENCOUNTER — Other Ambulatory Visit: Payer: Medicare Other | Admitting: Adult Health Nurse Practitioner

## 2019-09-24 DIAGNOSIS — Z515 Encounter for palliative care: Secondary | ICD-10-CM

## 2019-09-24 DIAGNOSIS — F0391 Unspecified dementia with behavioral disturbance: Secondary | ICD-10-CM

## 2019-09-24 NOTE — Progress Notes (Signed)
Therapist, nutritional Palliative Care Consult Note Telephone: [phone]  Fax: [phone]  PATIENT NAME: Suzanne Russo DOB: 06/24/1932 MRN: [id number]  PRIMARY CARE PROVIDER:   Marisue Ivan, MD  REFERRING PROVIDER:  Marisue Ivan, MD [phone] [facility] [address] [facility] [place],  [place] [address]  RESPONSIBLE PARTY:   Dakayla Disanti, husband [phone]     RECOMMENDATIONS and PLAN:  1.  Advanced care planning.  Patient is DNR.    2.  Dementia.  FAST 7b.  Received call from daughter today requesting visit due to patient decline.  Patient has been eating less.  She has been more lethargic but at times seems more like herself.  Has been walking less and at times requires a wheelchair to get to the bathroom or has to use the bedside commode.  Husband is feeling overwhelmed trying to get her to eat and drink more.  He does have caregivers that come into the home 24/7 to help with her care.  Patient has dark yellow urine but no increased frequency, hematuria, or odor.  Denies SOB, cough, fever, N/V/D.  Has had more occasional constipation and uses stool softeners and laxative as needed.  Encouraged to use stool softener daily to see if this helps and encourage fluids.  Lung sounds clear, heart with RRR, no edema, no suprapubic distention.  Does have somewhat low BP at 98/52 and HR 69 O2 96% on RA.  No indication of infection.  May have slight dehydration and are encouraged to offer her fluids.  Believe worsening symptoms may be related to disease progression.  Went over at length disease progression with family.  Went over option of hospice.  Husband not wanting to admit that she may be at that point but was accepting of the conversation. Answered his questions about hospice. He is not ready for hospice at this time but did discuss that he would like to keep her comfortable at home as much as possible.  Will continue to assess patient every couple of weeks and  family encouraged to call sooner with any questions or concerns.  Will discuss case with hospice physicians.      I spent 60 minutes providing this consultation,  from 1:10 to 2:10. More than 50% of the time in this consultation was spent coordinating communication.   HISTORY OF PRESENT ILLNESS:  Suzanne Russo is a 85 y.o. year old female with multiple medical problems including dementia, recurrent falls, HTN, PVD, trigeminal neuralgia. Palliative Care was asked to help address goals of care.   CODE STATUS: DNR  PPS: 40% HOSPICE ELIGIBILITY/DIAGNOSIS: TBD  PHYSICAL EXAM:  BP 98/52  HR 69  O2 96% on RA  Right MUAC 21 cm General: NAD, frail appearing, thin Cardiovascular: regular rate and rhythm Pulmonary: lung sounds clear; normal respiratory effort Abdomen: soft, nontender, hypoactive bowel sounds GU: no suprapubic tenderness Extremities: no edema, no joint deformities Skin: no rashes Neurological: Weakness; A&O to person only   PAST MEDICAL HISTORY:  Past Medical History:  Diagnosis Date  . Carpal tunnel syndrome   . Concussion with no loss of consciousness 08/26/2015  . Degenerative arthritis   . Dementia ([facility]) 02/13/2019  . DVT (deep venous thrombosis) ([facility])   . Gait abnormality 06/23/2019  . GERD (gastroesophageal reflux disease)   . Hemorrhoids   . Hypothyroidism   . Memory difficulty 07/16/2014  . Occipital neuralgia   . Occipital neuralgia of left side 12/15/2014  . Osteoporosis   . Pelvic fracture (  [place])   . Peptic ulcer disease   . Postconcussive syndrome 12/20/2018  . Right clavicle fracture   . Trigeminal neuralgia   . Venous insufficiency     SOCIAL HX:  Social History   Tobacco Use  . Smoking status: Former Smoker    Packs/day: 1.00    Years: 45.00    Pack years: 45.00    Types: Cigarettes  . Smokeless tobacco: Never Used  Substance Use Topics  . Alcohol use: Yes    Alcohol/week: 7.0 standard drinks    Types: 7 Glasses of wine per week    Comment:  wine on occasion    ALLERGIES:  Allergies  Allergen Reactions  . Aricept [Donepezil]     GI upset  . Citalopram Other (See Comments)    Caused bad dreams  . Sulfa Antibiotics Rash  . Tetracycline Rash     PERTINENT MEDICATIONS:  Outpatient Encounter Medications as of 09/24/2019  Medication Sig  . acetaminophen (TYLENOL) 500 MG tablet Take 1,000 mg by mouth every 8 (eight) hours.   . bisacodyl (DULCOLAX) 5 MG EC tablet Take 1 tablet (5 mg total) by mouth daily as needed for moderate constipation.  . Calcium Carbonate-Vit D-Min (CALTRATE 600+D PLUS MINERALS) 600-800 MG-UNIT TABS Take 1 tablet by mouth 2 (two) times daily.  . carbamazepine (CARBATROL) 200 MG 12 hr capsule Take 1 capsule (200 mg total) by mouth 2 (two) times daily.  . carbamazepine (CARBATROL) 300 MG 12 hr capsule TAKE 1 CAPSULE BY MOUTH TWICE DAILY  . celecoxib (CELEBREX) 200 MG capsule Take 200 mg by mouth daily.  . Cyanocobalamin (B-12 PO) Take 2,500 mcg by mouth daily.   . hydrocortisone (ANUSOL-HC) 25 MG suppository Place 25 mg rectally 2 (two) times daily as needed for hemorrhoids or anal itching.   . levothyroxine (SYNTHROID, LEVOTHROID) 100 MCG tablet Take 100 mcg by mouth daily before breakfast.  . loratadine-pseudoephedrine (CLARITIN-D 24-HOUR) 10-240 MG 24 hr tablet Take 1 tablet by mouth daily.  . meclizine (ANTIVERT) 25 MG tablet Take 25 mg by mouth every 6 (six) hours as needed.  . metoprolol tartrate (LOPRESSOR) 25 MG tablet Take 0.5 tablets (12.5 mg total) by mouth 2 (two) times daily. (Patient taking differently: Take 12.5 mg by mouth as needed. )  . OLANZapine (ZYPREXA) 2.5 MG tablet 1 tablet in the morning  . OLANZapine (ZYPREXA) 5 MG tablet Take at 4 PM and at bedtime  . omeprazole (PRILOSEC) 20 MG capsule Take 20 mg by mouth daily.  Marland Kitchen senna-docusate (SENOKOT-S) 8.6-50 MG tablet Take 1 tablet by mouth at bedtime as needed for mild constipation.  . triamcinolone cream (KENALOG) 0.1 % Apply 1 application  topically 2 (two) times daily.   Marland Kitchen zolendronic acid (ZOMETA) 4 MG/5ML injection Inject 4 mg into the vein once. Last received inj 11/07/17   No facility-administered encounter medications on file as of 09/24/2019.     Mariella Blackwelder Jenetta Downer, NP

## 2019-09-25 ENCOUNTER — Telehealth: Payer: Self-pay | Admitting: Adult Health Nurse Practitioner

## 2019-09-25 NOTE — Telephone Encounter (Signed)
Spoke with daughter about transitioning her mother to hospice.  She is going to talk to her father about this and get back with me Shaneika Rossa K. Olena Heckle NP

## 2019-09-25 NOTE — Telephone Encounter (Signed)
Spoke with daughter about asking for hospice referral from Dr. Netty Starring and to expect to be hearing from our hospice office once they get the referral. Meelah Tallo K. Olena Heckle NP

## 2019-09-25 NOTE — Telephone Encounter (Signed)
Daughter left message with stating that family is in agreement with starting hospice.  Reached out to Dr. Raylene Miyamoto office for a referral and to see if he will continue to be the attending. Sherena Machorro K. Olena Heckle NP

## 2019-10-10 DEATH — deceased

## 2019-10-30 ENCOUNTER — Ambulatory Visit: Payer: Medicare Other | Admitting: Neurology

## 2021-04-15 IMAGING — CT CT HEAD WITHOUT CONTRAST
3 of 4 series · 15 of 47 positions shown, 18 images · non-contrast
Comparison: CT scan of December 06, 2018.

CLINICAL DATA: Altered level of consciousness.

EXAM:
CT HEAD WITHOUT CONTRAST
TECHNIQUE: Contiguous axial images were obtained from the base of the skull
through the vertex without intravenous contrast.

[Series 4: coronal soft tissue · coronal · 0.31mm/px · 3 of 63 slices shown]
[im 21/63  brain]
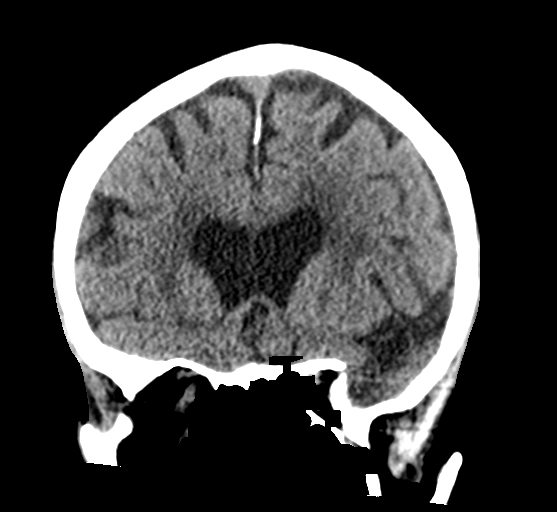
[im 28/63  brain]
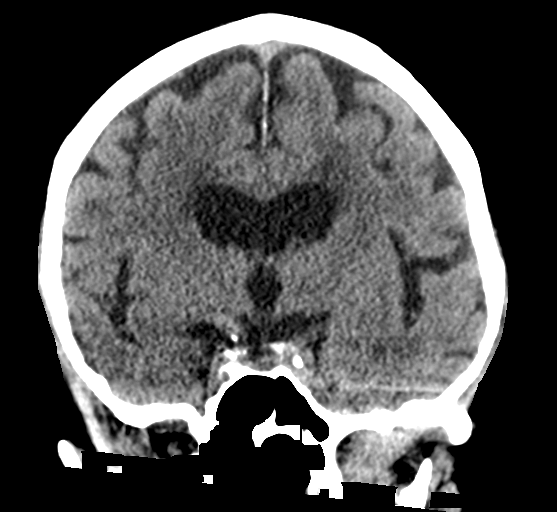
[im 35/63  brain]
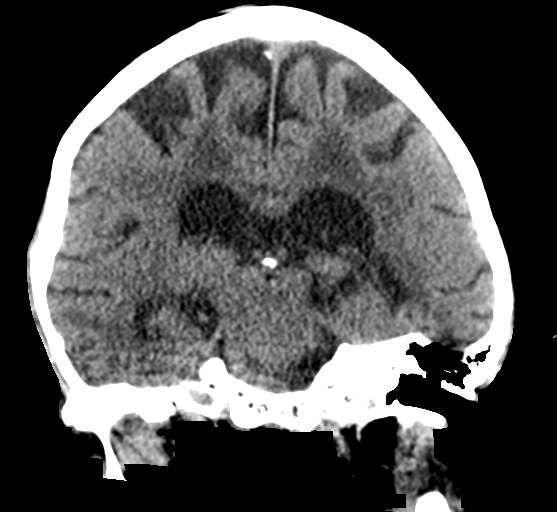

[Series 5: sagittal soft tissue · sagittal · 0.32mm/px · 3 of 55 slices shown]
[im 19/55  brain]
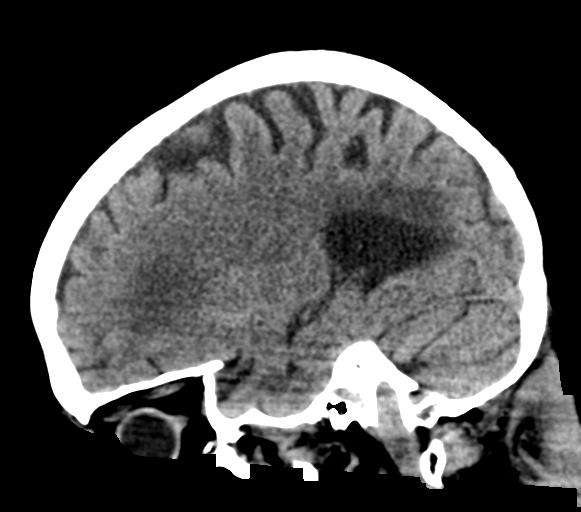
[im 28/55  brain]
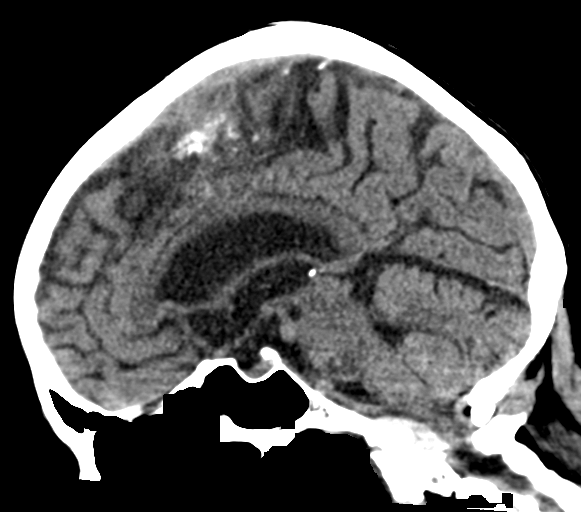
[im 37/55  brain]
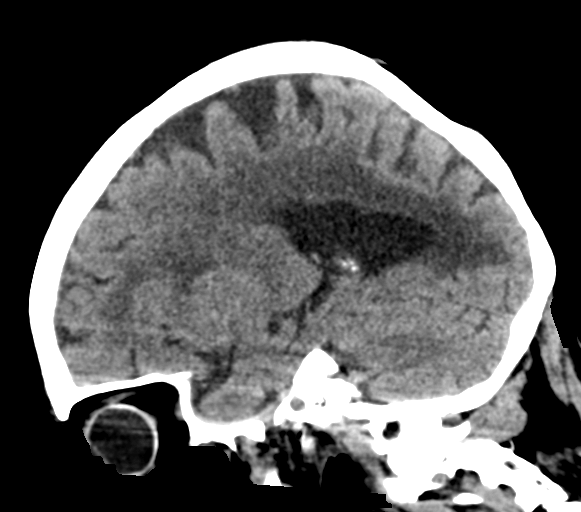

[Series 6: axial soft tissue · axial · 0.34mm/px · z∈[-221,-79]mm · 9 of 54 slices shown, 12 images]
[im 3/54  brain]
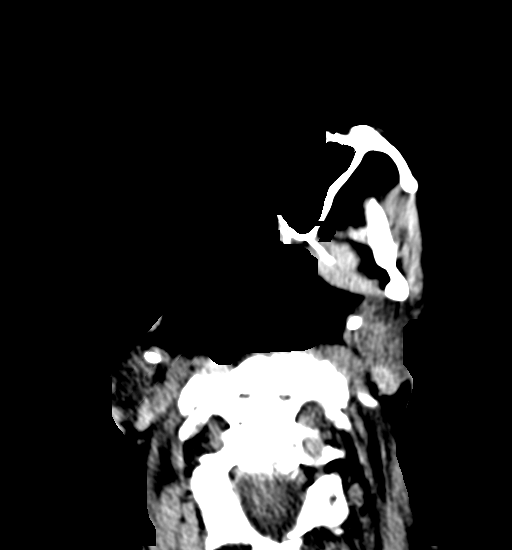
[im 3/54  bone]
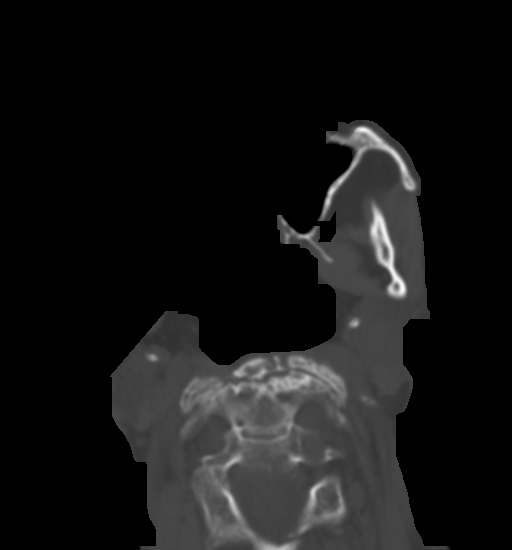
[im 9/54  brain]
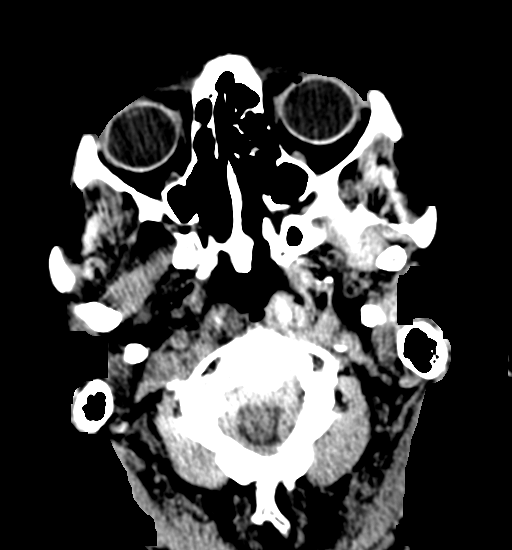
[im 15/54  brain]
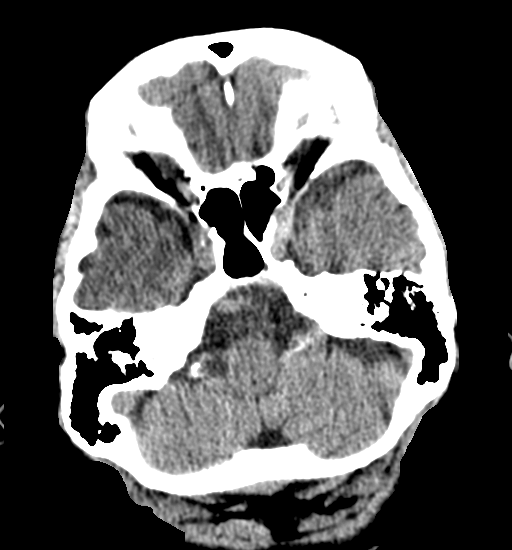
[im 21/54  brain]
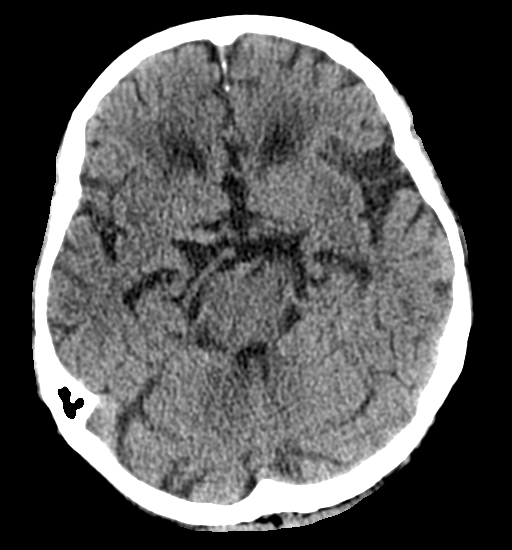
[im 27/54  brain]
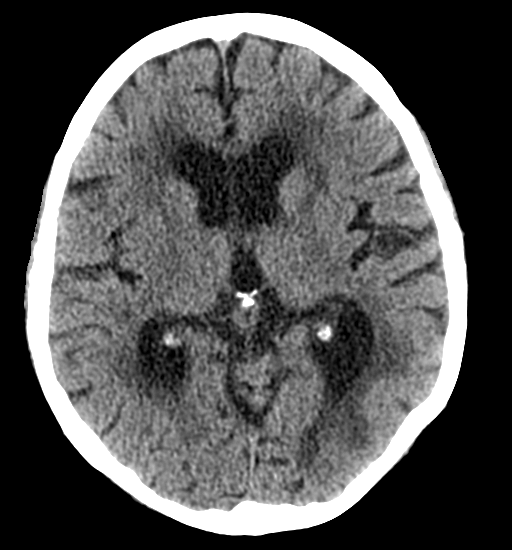
[im 27/54  bone]
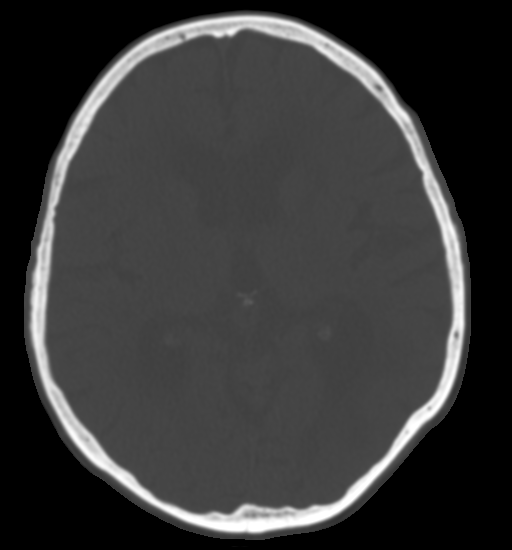
[im 33/54  brain]
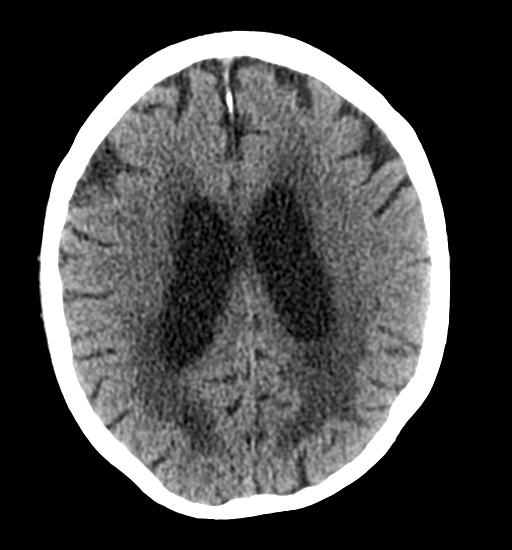
[im 39/54  brain]
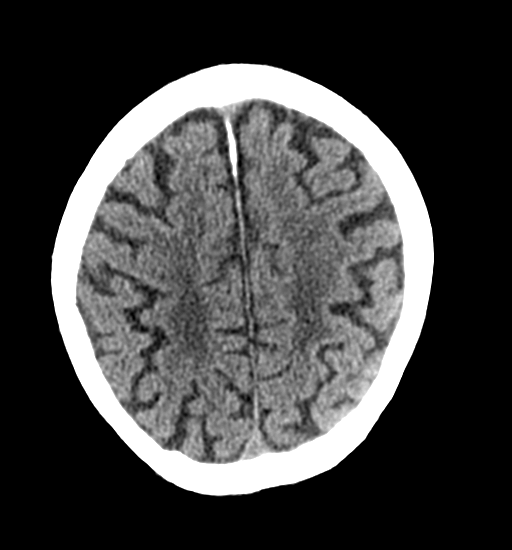
[im 45/54  brain]
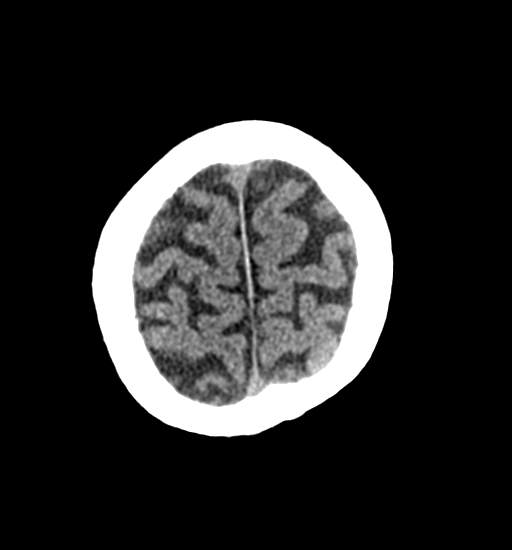
[im 51/54  brain]
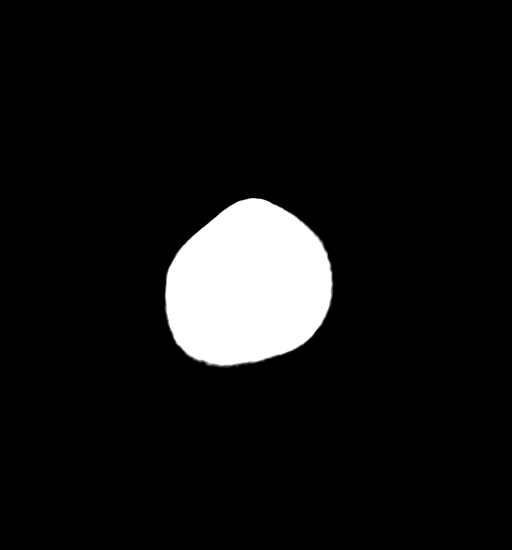
[im 51/54  bone]
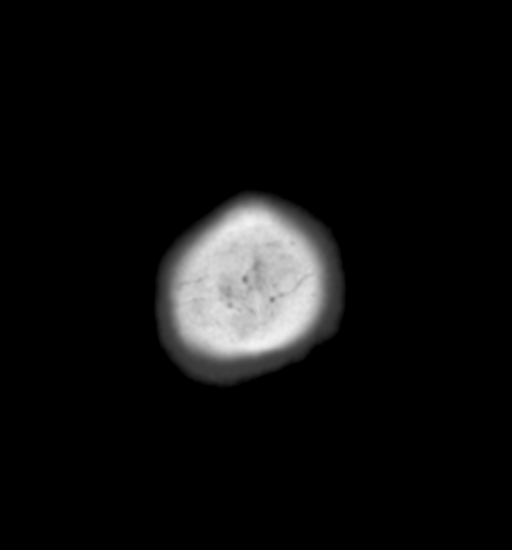

[15 of 47 positions shown; findings below may reference images not displayed]

FINDINGS: Brain: Mild chronic ischemic white matter disease is noted. No mass
effect or midline shift is noted. Ventricular size is within normal
limits. There is no evidence of mass lesion, hemorrhage or acute
infarction.

Vascular: No hyperdense vessel or unexpected calcification.

Skull: Normal. Negative for fracture or focal lesion.

Sinuses/Orbits: No acute finding.

Other: None.
IMPRESSION: Mild chronic ischemic white matter disease. No acute intracranial
abnormality seen.
